# Patient Record
Sex: Female | Born: 1935 | Race: White | Hispanic: No | Smoking: Never smoker
Health system: Southern US, Community
[De-identification: ages and names within clinical notes are randomized; demographics above are authoritative.]

## PROBLEM LIST (undated history)

## (undated) DIAGNOSIS — J40 Bronchitis, not specified as acute or chronic: Secondary | ICD-10-CM

## (undated) DIAGNOSIS — Q211 Atrial septal defect, unspecified: Secondary | ICD-10-CM

## (undated) DIAGNOSIS — M069 Rheumatoid arthritis, unspecified: Secondary | ICD-10-CM

## (undated) DIAGNOSIS — F329 Major depressive disorder, single episode, unspecified: Secondary | ICD-10-CM

## (undated) DIAGNOSIS — M810 Age-related osteoporosis without current pathological fracture: Secondary | ICD-10-CM

## (undated) DIAGNOSIS — F419 Anxiety disorder, unspecified: Secondary | ICD-10-CM

## (undated) DIAGNOSIS — I639 Cerebral infarction, unspecified: Secondary | ICD-10-CM

## (undated) DIAGNOSIS — I350 Nonrheumatic aortic (valve) stenosis: Secondary | ICD-10-CM

## (undated) DIAGNOSIS — J449 Chronic obstructive pulmonary disease, unspecified: Secondary | ICD-10-CM

## (undated) DIAGNOSIS — I10 Essential (primary) hypertension: Secondary | ICD-10-CM

## (undated) DIAGNOSIS — I5031 Acute diastolic (congestive) heart failure: Secondary | ICD-10-CM

## (undated) DIAGNOSIS — F32A Depression, unspecified: Secondary | ICD-10-CM

## (undated) DIAGNOSIS — Q2730 Arteriovenous malformation, site unspecified: Secondary | ICD-10-CM

## (undated) DIAGNOSIS — E039 Hypothyroidism, unspecified: Secondary | ICD-10-CM

## (undated) DIAGNOSIS — D649 Anemia, unspecified: Secondary | ICD-10-CM

## (undated) DIAGNOSIS — R011 Cardiac murmur, unspecified: Secondary | ICD-10-CM

## (undated) HISTORY — PX: CHOLECYSTECTOMY: SHX55

## (undated) HISTORY — PX: FEMUR FRACTURE SURGERY: SHX633

## (undated) HISTORY — PX: TOTAL ABDOMINAL HYSTERECTOMY: SHX209

## (undated) HISTORY — PX: FRACTURE SURGERY: SHX138

---

## 1998-11-08 ENCOUNTER — Emergency Department (HOSPITAL_COMMUNITY): Admission: EM | Admit: 1998-11-08 | Discharge: 1998-11-08 | Payer: Self-pay | Admitting: Emergency Medicine

## 2004-07-25 ENCOUNTER — Encounter: Payer: Self-pay | Admitting: Internal Medicine

## 2004-08-25 ENCOUNTER — Encounter: Payer: Self-pay | Admitting: Internal Medicine

## 2004-09-24 ENCOUNTER — Encounter: Payer: Self-pay | Admitting: Internal Medicine

## 2004-10-25 ENCOUNTER — Encounter: Payer: Self-pay | Admitting: Internal Medicine

## 2004-11-25 ENCOUNTER — Encounter: Payer: Self-pay | Admitting: Internal Medicine

## 2004-12-23 ENCOUNTER — Encounter: Payer: Self-pay | Admitting: Internal Medicine

## 2005-01-21 ENCOUNTER — Ambulatory Visit: Payer: Self-pay | Admitting: Internal Medicine

## 2005-03-07 ENCOUNTER — Inpatient Hospital Stay: Payer: Self-pay

## 2005-03-07 ENCOUNTER — Other Ambulatory Visit: Payer: Self-pay

## 2005-03-10 ENCOUNTER — Other Ambulatory Visit: Payer: Self-pay

## 2005-11-22 ENCOUNTER — Other Ambulatory Visit: Payer: Self-pay

## 2005-11-22 ENCOUNTER — Inpatient Hospital Stay: Payer: Self-pay

## 2005-12-03 ENCOUNTER — Ambulatory Visit: Payer: Self-pay | Admitting: Internal Medicine

## 2005-12-07 ENCOUNTER — Ambulatory Visit: Payer: Self-pay | Admitting: Internal Medicine

## 2005-12-14 ENCOUNTER — Ambulatory Visit: Payer: Self-pay | Admitting: Internal Medicine

## 2006-01-11 ENCOUNTER — Ambulatory Visit: Payer: Self-pay | Admitting: Internal Medicine

## 2006-02-23 ENCOUNTER — Encounter: Payer: Self-pay | Admitting: Emergency Medicine

## 2006-03-11 ENCOUNTER — Inpatient Hospital Stay: Payer: Self-pay | Admitting: Internal Medicine

## 2006-03-11 ENCOUNTER — Other Ambulatory Visit: Payer: Self-pay

## 2006-05-20 ENCOUNTER — Ambulatory Visit: Payer: Self-pay | Admitting: Internal Medicine

## 2006-05-27 ENCOUNTER — Ambulatory Visit: Payer: Self-pay | Admitting: Internal Medicine

## 2006-06-25 ENCOUNTER — Ambulatory Visit: Payer: Self-pay | Admitting: Internal Medicine

## 2006-07-25 ENCOUNTER — Ambulatory Visit: Payer: Self-pay | Admitting: Internal Medicine

## 2006-08-25 ENCOUNTER — Ambulatory Visit: Payer: Self-pay | Admitting: Internal Medicine

## 2006-09-24 ENCOUNTER — Ambulatory Visit: Payer: Self-pay | Admitting: Internal Medicine

## 2006-10-25 ENCOUNTER — Ambulatory Visit: Payer: Self-pay | Admitting: Internal Medicine

## 2006-11-25 ENCOUNTER — Ambulatory Visit: Payer: Self-pay | Admitting: Internal Medicine

## 2006-12-24 ENCOUNTER — Ambulatory Visit: Payer: Self-pay | Admitting: Internal Medicine

## 2007-01-19 ENCOUNTER — Ambulatory Visit: Payer: Self-pay | Admitting: Internal Medicine

## 2007-01-24 ENCOUNTER — Ambulatory Visit: Payer: Self-pay | Admitting: Internal Medicine

## 2007-02-23 ENCOUNTER — Ambulatory Visit: Payer: Self-pay | Admitting: Internal Medicine

## 2007-03-26 ENCOUNTER — Ambulatory Visit: Payer: Self-pay | Admitting: Internal Medicine

## 2007-04-25 ENCOUNTER — Ambulatory Visit: Payer: Self-pay | Admitting: Internal Medicine

## 2007-05-26 ENCOUNTER — Ambulatory Visit: Payer: Self-pay | Admitting: Internal Medicine

## 2007-06-15 ENCOUNTER — Ambulatory Visit: Payer: Self-pay | Admitting: Internal Medicine

## 2007-06-20 ENCOUNTER — Ambulatory Visit: Payer: Self-pay | Admitting: Internal Medicine

## 2007-06-25 ENCOUNTER — Inpatient Hospital Stay: Payer: Self-pay | Admitting: Unknown Physician Specialty

## 2007-06-26 ENCOUNTER — Ambulatory Visit: Payer: Self-pay | Admitting: Internal Medicine

## 2007-07-05 ENCOUNTER — Emergency Department: Payer: Self-pay | Admitting: Emergency Medicine

## 2007-08-15 ENCOUNTER — Ambulatory Visit: Payer: Self-pay | Admitting: Internal Medicine

## 2007-08-26 ENCOUNTER — Ambulatory Visit: Payer: Self-pay | Admitting: Internal Medicine

## 2007-08-31 ENCOUNTER — Ambulatory Visit: Payer: Self-pay | Admitting: Surgery

## 2007-09-09 ENCOUNTER — Emergency Department: Payer: Self-pay | Admitting: Emergency Medicine

## 2007-09-09 ENCOUNTER — Other Ambulatory Visit: Payer: Self-pay

## 2007-09-15 ENCOUNTER — Ambulatory Visit: Payer: Self-pay | Admitting: Unknown Physician Specialty

## 2007-09-25 ENCOUNTER — Ambulatory Visit: Payer: Self-pay | Admitting: Internal Medicine

## 2007-10-13 ENCOUNTER — Emergency Department: Payer: Self-pay | Admitting: Unknown Physician Specialty

## 2007-10-13 ENCOUNTER — Other Ambulatory Visit: Payer: Self-pay

## 2007-10-16 ENCOUNTER — Emergency Department: Payer: Self-pay

## 2007-10-16 ENCOUNTER — Other Ambulatory Visit: Payer: Self-pay

## 2007-10-22 ENCOUNTER — Other Ambulatory Visit: Payer: Self-pay

## 2007-10-22 ENCOUNTER — Inpatient Hospital Stay: Payer: Self-pay | Admitting: Internal Medicine

## 2007-11-10 ENCOUNTER — Ambulatory Visit: Payer: Self-pay | Admitting: Internal Medicine

## 2008-03-08 ENCOUNTER — Ambulatory Visit: Payer: Self-pay | Admitting: Unknown Physician Specialty

## 2008-05-09 ENCOUNTER — Ambulatory Visit: Payer: Self-pay | Admitting: Unknown Physician Specialty

## 2010-08-14 ENCOUNTER — Ambulatory Visit: Payer: Self-pay | Admitting: Unknown Physician Specialty

## 2010-08-18 LAB — PATHOLOGY REPORT

## 2010-09-28 ENCOUNTER — Ambulatory Visit: Payer: Self-pay | Admitting: Unknown Physician Specialty

## 2010-12-28 ENCOUNTER — Emergency Department: Payer: Self-pay | Admitting: Unknown Physician Specialty

## 2011-01-12 ENCOUNTER — Emergency Department: Payer: Self-pay | Admitting: Emergency Medicine

## 2011-11-29 ENCOUNTER — Ambulatory Visit: Payer: Self-pay | Admitting: Internal Medicine

## 2011-12-01 ENCOUNTER — Ambulatory Visit: Payer: Self-pay | Admitting: Vascular Surgery

## 2012-02-17 LAB — BASIC METABOLIC PANEL
Anion Gap: 9 (ref 7–16)
BUN: 18 mg/dL (ref 7–18)
Calcium, Total: 8.4 mg/dL — ABNORMAL LOW (ref 8.5–10.1)
Chloride: 103 mmol/L (ref 98–107)
Co2: 26 mmol/L (ref 21–32)
Creatinine: 0.88 mg/dL (ref 0.60–1.30)
EGFR (African American): >60
EGFR (Non-African Amer.): >60
Glucose: 82 mg/dL (ref 65–99)
Osmolality: 277 (ref 275–301)
Potassium: 4.1 mmol/L (ref 3.5–5.1)
Sodium: 138 mmol/L (ref 136–145)

## 2012-02-17 LAB — CBC
HCT: 36.9 % (ref 35.0–47.0)
HGB: 12.4 g/dL (ref 12.0–16.0)
MCH: 33.7 pg (ref 26.0–34.0)
MCHC: 33.6 g/dL (ref 32.0–36.0)
MCV: 100 fL (ref 80–100)
Platelet: 136 10*3/uL — ABNORMAL LOW (ref 150–440)
RBC: 3.68 10*6/uL — ABNORMAL LOW (ref 3.80–5.20)
RDW: 14 % (ref 11.5–14.5)
WBC: 9 10*3/uL (ref 3.6–11.0)

## 2012-02-17 LAB — APTT: Activated PTT: 30 secs (ref 23.6–35.9)

## 2012-02-17 LAB — PROTIME-INR
INR: 1
Prothrombin Time: 13.3 secs (ref 11.5–14.7)

## 2012-02-18 ENCOUNTER — Inpatient Hospital Stay: Payer: Self-pay | Admitting: Surgery

## 2012-02-18 LAB — CBC WITH DIFFERENTIAL/PLATELET
Basophil #: 0 10*3/uL (ref 0.0–0.1)
Basophil %: 0.4 %
Eosinophil #: 0 10*3/uL (ref 0.0–0.7)
Eosinophil %: 0.5 %
HCT: 22.2 % — ABNORMAL LOW (ref 35.0–47.0)
HGB: 7.4 g/dL — ABNORMAL LOW (ref 12.0–16.0)
Lymphocyte #: 1.9 10*3/uL (ref 1.0–3.6)
Lymphocyte %: 38.1 %
MCH: 33.4 pg (ref 26.0–34.0)
MCHC: 33.1 g/dL (ref 32.0–36.0)
MCV: 101 fL — ABNORMAL HIGH (ref 80–100)
Monocyte #: 0.2 x10 3/mm (ref 0.2–0.9)
Monocyte %: 4.6 %
Neutrophil #: 2.8 10*3/uL (ref 1.4–6.5)
Neutrophil %: 56.4 %
Platelet: 103 10*3/uL — ABNORMAL LOW (ref 150–440)
RBC: 2.2 10*6/uL — ABNORMAL LOW (ref 3.80–5.20)
RDW: 13.6 % (ref 11.5–14.5)
WBC: 4.9 10*3/uL (ref 3.6–11.0)

## 2012-02-19 LAB — CBC WITH DIFFERENTIAL/PLATELET
Basophil #: 0 10*3/uL (ref 0.0–0.1)
Basophil %: 0.4 %
Eosinophil #: 0 10*3/uL (ref 0.0–0.7)
Eosinophil %: 0.7 %
HCT: 28.2 % — ABNORMAL LOW (ref 35.0–47.0)
HGB: 9.6 g/dL — ABNORMAL LOW (ref 12.0–16.0)
Lymphocyte #: 1.9 10*3/uL (ref 1.0–3.6)
Lymphocyte %: 26 %
MCH: 32.6 pg (ref 26.0–34.0)
MCHC: 34 g/dL (ref 32.0–36.0)
MCV: 96 fL (ref 80–100)
Monocyte #: 0.3 x10 3/mm (ref 0.2–0.9)
Monocyte %: 4.2 %
Neutrophil #: 5.1 10*3/uL (ref 1.4–6.5)
Neutrophil %: 68.7 %
Platelet: 62 10*3/uL — ABNORMAL LOW (ref 150–440)
RBC: 2.94 10*6/uL — ABNORMAL LOW (ref 3.80–5.20)
RDW: 15 % — ABNORMAL HIGH (ref 11.5–14.5)
WBC: 7.4 10*3/uL (ref 3.6–11.0)

## 2012-02-19 LAB — BASIC METABOLIC PANEL
Anion Gap: 6 — ABNORMAL LOW (ref 7–16)
BUN: 11 mg/dL (ref 7–18)
Calcium, Total: 6.9 mg/dL — CL (ref 8.5–10.1)
Chloride: 106 mmol/L (ref 98–107)
Co2: 25 mmol/L (ref 21–32)
Creatinine: 0.82 mg/dL (ref 0.60–1.30)
EGFR (African American): >60
EGFR (Non-African Amer.): >60
Glucose: 130 mg/dL — ABNORMAL HIGH (ref 65–99)
Osmolality: 275 (ref 275–301)
Potassium: 4.2 mmol/L (ref 3.5–5.1)
Sodium: 137 mmol/L (ref 136–145)

## 2012-02-19 LAB — HEPATIC FUNCTION PANEL A (ARMC)
Albumin: 1.7 g/dL — ABNORMAL LOW (ref 3.4–5.0)
Alkaline Phosphatase: 43 U/L — ABNORMAL LOW (ref 50–136)
Bilirubin, Direct: 0.1 mg/dL (ref 0.00–0.20)
Bilirubin,Total: 0.3 mg/dL (ref 0.2–1.0)
SGOT(AST): 16 U/L (ref 15–37)
SGPT (ALT): 11 U/L — ABNORMAL LOW
Total Protein: 3.7 g/dL — ABNORMAL LOW (ref 6.4–8.2)

## 2013-07-12 ENCOUNTER — Inpatient Hospital Stay: Payer: Self-pay | Admitting: Internal Medicine

## 2013-07-12 LAB — CBC WITH DIFFERENTIAL/PLATELET
Basophil #: 0 10*3/uL (ref 0.0–0.1)
Basophil %: 0.5 %
Eosinophil #: 0.5 10*3/uL (ref 0.0–0.7)
Eosinophil %: 5.2 %
HCT: 39.1 % (ref 35.0–47.0)
HGB: 13.7 g/dL (ref 12.0–16.0)
Lymphocyte #: 0.8 10*3/uL — ABNORMAL LOW (ref 1.0–3.6)
Lymphocyte %: 9.1 %
MCH: 34.6 pg — ABNORMAL HIGH (ref 26.0–34.0)
MCHC: 35.1 g/dL (ref 32.0–36.0)
MCV: 99 fL (ref 80–100)
Monocyte #: 0.2 x10 3/mm (ref 0.2–0.9)
Monocyte %: 2.4 %
Neutrophil #: 7.5 10*3/uL — ABNORMAL HIGH (ref 1.4–6.5)
Neutrophil %: 82.8 %
Platelet: 168 10*3/uL (ref 150–440)
RBC: 3.97 10*6/uL (ref 3.80–5.20)
RDW: 12.8 % (ref 11.5–14.5)
WBC: 9.1 10*3/uL (ref 3.6–11.0)

## 2013-07-12 LAB — COMPREHENSIVE METABOLIC PANEL
Alkaline Phosphatase: 98 U/L (ref 50–136)
Co2: 29 mmol/L (ref 21–32)
Creatinine: 1.06 mg/dL (ref 0.60–1.30)
EGFR (Non-African Amer.): 49 — ABNORMAL LOW
Osmolality: 273 (ref 275–301)
SGOT(AST): 20 U/L (ref 15–37)

## 2013-07-13 LAB — CBC WITH DIFFERENTIAL/PLATELET
Basophil #: 0 10*3/uL (ref 0.0–0.1)
Basophil %: 0.6 %
Eosinophil #: 0 10*3/uL (ref 0.0–0.7)
HCT: 36.4 % (ref 35.0–47.0)
HGB: 12.6 g/dL (ref 12.0–16.0)
Lymphocyte #: 1.1 10*3/uL (ref 1.0–3.6)
Lymphocyte %: 14.5 %
Monocyte #: 0.1 x10 3/mm — ABNORMAL LOW (ref 0.2–0.9)
Neutrophil %: 83.2 %
RBC: 3.69 10*6/uL — ABNORMAL LOW (ref 3.80–5.20)
WBC: 7.6 10*3/uL (ref 3.6–11.0)

## 2013-07-13 LAB — BASIC METABOLIC PANEL
BUN: 18 mg/dL (ref 7–18)
Calcium, Total: 9 mg/dL (ref 8.5–10.1)
Chloride: 103 mmol/L (ref 98–107)
Creatinine: 1 mg/dL (ref 0.60–1.30)
EGFR (African American): >60
EGFR (Non-African Amer.): 52 — ABNORMAL LOW
Glucose: 149 mg/dL — ABNORMAL HIGH (ref 65–99)
Osmolality: 279 (ref 275–301)
Potassium: 4.4 mmol/L (ref 3.5–5.1)
Sodium: 137 mmol/L (ref 136–145)

## 2013-07-14 LAB — BASIC METABOLIC PANEL
Anion Gap: 6 — ABNORMAL LOW (ref 7–16)
Calcium, Total: 8.8 mg/dL (ref 8.5–10.1)
Chloride: 103 mmol/L (ref 98–107)
Creatinine: 1.2 mg/dL (ref 0.60–1.30)
Glucose: 159 mg/dL — ABNORMAL HIGH (ref 65–99)
Osmolality: 285 (ref 275–301)
Potassium: 4 mmol/L (ref 3.5–5.1)

## 2013-07-14 LAB — CBC WITH DIFFERENTIAL/PLATELET
Basophil #: 0 10*3/uL (ref 0.0–0.1)
Basophil %: 0.3 %
Eosinophil #: 0 10*3/uL (ref 0.0–0.7)
Eosinophil %: 0.2 %
HCT: 35 % (ref 35.0–47.0)
HGB: 12.3 g/dL (ref 12.0–16.0)
Lymphocyte #: 1.2 10*3/uL (ref 1.0–3.6)
MCV: 98 fL (ref 80–100)
Monocyte #: 0.2 x10 3/mm (ref 0.2–0.9)
Monocyte %: 1.7 %
Neutrophil #: 8 10*3/uL — ABNORMAL HIGH (ref 1.4–6.5)
RBC: 3.57 10*6/uL — ABNORMAL LOW (ref 3.80–5.20)
WBC: 9.4 10*3/uL (ref 3.6–11.0)

## 2013-07-17 LAB — COMPREHENSIVE METABOLIC PANEL
Alkaline Phosphatase: 73 U/L (ref 50–136)
BUN: 36 mg/dL — ABNORMAL HIGH (ref 7–18)
Chloride: 101 mmol/L (ref 98–107)
EGFR (African American): 47 — ABNORMAL LOW
EGFR (Non-African Amer.): 41 — ABNORMAL LOW
Glucose: 169 mg/dL — ABNORMAL HIGH (ref 65–99)
Osmolality: 284 (ref 275–301)
Potassium: 4.2 mmol/L (ref 3.5–5.1)
SGOT(AST): 24 U/L (ref 15–37)
Sodium: 136 mmol/L (ref 136–145)

## 2013-07-17 LAB — CBC WITH DIFFERENTIAL/PLATELET
Basophil %: 0.1 %
Eosinophil #: 0 10*3/uL (ref 0.0–0.7)
Eosinophil %: 0.1 %
HGB: 12.8 g/dL (ref 12.0–16.0)
Lymphocyte #: 1.3 10*3/uL (ref 1.0–3.6)
Lymphocyte %: 17.1 %
MCH: 34.7 pg — ABNORMAL HIGH (ref 26.0–34.0)
Monocyte %: 2.2 %
Neutrophil #: 6.2 10*3/uL (ref 1.4–6.5)
Neutrophil %: 80.5 %
Platelet: 204 10*3/uL (ref 150–440)
RBC: 3.69 10*6/uL — ABNORMAL LOW (ref 3.80–5.20)
RDW: 12.8 % (ref 11.5–14.5)

## 2013-07-18 LAB — EXPECTORATED SPUTUM ASSESSMENT W GRAM STAIN, RFLX TO RESP C

## 2014-04-06 LAB — CBC
HCT: 36.3 % (ref 35.0–47.0)
HGB: 12.5 g/dL (ref 12.0–16.0)
MCH: 34.2 pg — AB (ref 26.0–34.0)
MCHC: 34.4 g/dL (ref 32.0–36.0)
MCV: 99 fL (ref 80–100)
Platelet: 153 10*3/uL (ref 150–440)
RBC: 3.65 10*6/uL — ABNORMAL LOW (ref 3.80–5.20)
RDW: 13.1 % (ref 11.5–14.5)
WBC: 10.5 10*3/uL (ref 3.6–11.0)

## 2014-04-07 ENCOUNTER — Inpatient Hospital Stay: Payer: Self-pay | Admitting: Internal Medicine

## 2014-04-07 LAB — URINALYSIS, COMPLETE
Bilirubin,UR: NEGATIVE
Glucose,UR: NEGATIVE mg/dL (ref 0–75)
Hyaline Cast: 3
Ketone: NEGATIVE
Nitrite: NEGATIVE
PH: 5 (ref 4.5–8.0)
Protein: 100
RBC,UR: 43 /HPF (ref 0–5)
Specific Gravity: 1.01 (ref 1.003–1.030)
Squamous Epithelial: NONE SEEN
WBC UR: 1005 /HPF (ref 0–5)

## 2014-04-07 LAB — TROPONIN I
Troponin-I: 0.22 ng/mL — ABNORMAL HIGH
Troponin-I: 0.93 ng/mL — ABNORMAL HIGH
Troponin-I: 0.75 ng/mL — ABNORMAL HIGH

## 2014-04-07 LAB — COMPREHENSIVE METABOLIC PANEL
ALK PHOS: 78 U/L
ALT: 12 U/L (ref 12–78)
ANION GAP: 9 (ref 7–16)
Albumin: 3.2 g/dL — ABNORMAL LOW (ref 3.4–5.0)
BUN: 21 mg/dL — ABNORMAL HIGH (ref 7–18)
Bilirubin,Total: 0.3 mg/dL (ref 0.2–1.0)
CO2: 26 mmol/L (ref 21–32)
Calcium, Total: 9 mg/dL (ref 8.5–10.1)
Chloride: 107 mmol/L (ref 98–107)
Creatinine: 1.77 mg/dL — ABNORMAL HIGH (ref 0.60–1.30)
GFR CALC AF AMER: 30 — AB
GFR CALC NON AF AMER: 26 — AB
Glucose: 130 mg/dL — ABNORMAL HIGH (ref 65–99)
Osmolality: 288 (ref 275–301)
Potassium: 3.7 mmol/L (ref 3.5–5.1)
SGOT(AST): 24 U/L (ref 15–37)
SODIUM: 142 mmol/L (ref 136–145)
Total Protein: 7.1 g/dL (ref 6.4–8.2)

## 2014-04-07 LAB — CK-MB
CK-MB: 1.2 ng/mL (ref 0.5–3.6)
CK-MB: 4.5 ng/mL — ABNORMAL HIGH (ref 0.5–3.6)
CK-MB: 3.8 ng/mL — ABNORMAL HIGH (ref 0.5–3.6)

## 2014-04-07 LAB — CK TOTAL AND CKMB (NOT AT ARMC)
CK, TOTAL: 243 U/L — AB
CK-MB: 3.6 ng/mL (ref 0.5–3.6)

## 2014-04-07 LAB — MAGNESIUM: Magnesium: 1.2 mg/dL — ABNORMAL LOW

## 2014-04-07 LAB — PROTIME-INR
INR: 1
PROTHROMBIN TIME: 13.5 s (ref 11.5–14.7)

## 2014-04-07 LAB — PHOSPHORUS: Phosphorus: 2.8 mg/dL (ref 2.5–4.9)

## 2014-04-08 LAB — CBC WITH DIFFERENTIAL/PLATELET
BASOS ABS: 0.1 10*3/uL (ref 0.0–0.1)
BASOS PCT: 1 %
EOS PCT: 9.2 %
Eosinophil #: 0.7 10*3/uL (ref 0.0–0.7)
HCT: 32 % — AB (ref 35.0–47.0)
HGB: 10.8 g/dL — ABNORMAL LOW (ref 12.0–16.0)
LYMPHS ABS: 1.7 10*3/uL (ref 1.0–3.6)
Lymphocyte %: 23.5 %
MCH: 34.1 pg — ABNORMAL HIGH (ref 26.0–34.0)
MCHC: 33.9 g/dL (ref 32.0–36.0)
MCV: 101 fL — AB (ref 80–100)
Monocyte #: 0.5 x10 3/mm (ref 0.2–0.9)
Monocyte %: 6.3 %
NEUTROS ABS: 4.3 10*3/uL (ref 1.4–6.5)
NEUTROS PCT: 60 %
Platelet: 126 10*3/uL — ABNORMAL LOW (ref 150–440)
RBC: 3.18 10*6/uL — ABNORMAL LOW (ref 3.80–5.20)
RDW: 13.1 % (ref 11.5–14.5)
WBC: 7.2 10*3/uL (ref 3.6–11.0)

## 2014-04-08 LAB — BASIC METABOLIC PANEL
ANION GAP: 4 — AB (ref 7–16)
BUN: 18 mg/dL (ref 7–18)
CALCIUM: 8.1 mg/dL — AB (ref 8.5–10.1)
CHLORIDE: 113 mmol/L — AB (ref 98–107)
CREATININE: 1.28 mg/dL (ref 0.60–1.30)
Co2: 26 mmol/L (ref 21–32)
EGFR (African American): 45 — ABNORMAL LOW
GFR CALC NON AF AMER: 39 — AB
Glucose: 93 mg/dL (ref 65–99)
OSMOLALITY: 287 (ref 275–301)
POTASSIUM: 3.9 mmol/L (ref 3.5–5.1)
Sodium: 143 mmol/L (ref 136–145)

## 2014-04-08 LAB — MAGNESIUM: Magnesium: 1.3 mg/dL — ABNORMAL LOW

## 2014-04-09 LAB — BASIC METABOLIC PANEL
Anion Gap: 3 — ABNORMAL LOW (ref 7–16)
BUN: 13 mg/dL (ref 7–18)
CHLORIDE: 111 mmol/L — AB (ref 98–107)
CO2: 28 mmol/L (ref 21–32)
CREATININE: 1.25 mg/dL (ref 0.60–1.30)
Calcium, Total: 8.7 mg/dL (ref 8.5–10.1)
EGFR (African American): 46 — ABNORMAL LOW
EGFR (Non-African Amer.): 40 — ABNORMAL LOW
Glucose: 96 mg/dL (ref 65–99)
Osmolality: 283 (ref 275–301)
POTASSIUM: 4 mmol/L (ref 3.5–5.1)
SODIUM: 142 mmol/L (ref 136–145)

## 2014-04-09 LAB — CBC WITH DIFFERENTIAL/PLATELET
Basophil #: 0.1 10*3/uL (ref 0.0–0.1)
Basophil %: 1.3 %
EOS ABS: 0.6 10*3/uL (ref 0.0–0.7)
EOS PCT: 12.1 %
HCT: 32.8 % — ABNORMAL LOW (ref 35.0–47.0)
HGB: 11.2 g/dL — ABNORMAL LOW (ref 12.0–16.0)
Lymphocyte #: 1.4 10*3/uL (ref 1.0–3.6)
Lymphocyte %: 26.3 %
MCH: 34 pg (ref 26.0–34.0)
MCHC: 34 g/dL (ref 32.0–36.0)
MCV: 100 fL (ref 80–100)
Monocyte #: 0.5 x10 3/mm (ref 0.2–0.9)
Monocyte %: 9 %
NEUTROS PCT: 51.3 %
Neutrophil #: 2.7 10*3/uL (ref 1.4–6.5)
Platelet: 140 10*3/uL — ABNORMAL LOW (ref 150–440)
RBC: 3.29 10*6/uL — ABNORMAL LOW (ref 3.80–5.20)
RDW: 13.2 % (ref 11.5–14.5)
WBC: 5.3 10*3/uL (ref 3.6–11.0)

## 2014-04-09 LAB — URINE CULTURE

## 2014-04-09 LAB — MAGNESIUM: MAGNESIUM: 2.1 mg/dL

## 2015-02-03 ENCOUNTER — Inpatient Hospital Stay
Admit: 2015-02-03 | Disposition: A | Discharge status: Home / Self Care | Payer: Self-pay | Attending: Internal Medicine | Admitting: Internal Medicine

## 2015-02-03 LAB — URINALYSIS, COMPLETE
BACTERIA: NONE SEEN
Bilirubin,UR: NEGATIVE
GLUCOSE, UR: NEGATIVE mg/dL (ref 0–75)
Ketone: NEGATIVE
Leukocyte Esterase: NEGATIVE
Nitrite: NEGATIVE
Ph: 6 (ref 4.5–8.0)
Protein: NEGATIVE
SQUAMOUS EPITHELIAL: NONE SEEN
Specific Gravity: 1.004 (ref 1.003–1.030)

## 2015-02-03 LAB — CBC WITH DIFFERENTIAL/PLATELET
BASOS PCT: 1 %
Basophil #: 0.1 10*3/uL (ref 0.0–0.1)
EOS PCT: 5.2 %
Eosinophil #: 0.4 10*3/uL (ref 0.0–0.7)
HCT: 39 % (ref 35.0–47.0)
HGB: 13.2 g/dL (ref 12.0–16.0)
LYMPHS PCT: 20.6 %
Lymphocyte #: 1.7 10*3/uL (ref 1.0–3.6)
MCH: 33.9 pg (ref 26.0–34.0)
MCHC: 33.8 g/dL (ref 32.0–36.0)
MCV: 100 fL (ref 80–100)
MONOS PCT: 4 %
Monocyte #: 0.3 x10 3/mm (ref 0.2–0.9)
Neutrophil #: 5.8 10*3/uL (ref 1.4–6.5)
Neutrophil %: 69.2 %
Platelet: 143 10*3/uL — ABNORMAL LOW (ref 150–440)
RBC: 3.89 10*6/uL (ref 3.80–5.20)
RDW: 13.3 % (ref 11.5–14.5)
WBC: 8.3 10*3/uL (ref 3.6–11.0)

## 2015-02-03 LAB — BASIC METABOLIC PANEL
Anion Gap: 9 (ref 7–16)
BUN: 13 mg/dL
CHLORIDE: 101 mmol/L
Calcium, Total: 9.2 mg/dL
Co2: 30 mmol/L
Creatinine: 1.11 mg/dL — ABNORMAL HIGH
GFR CALC AF AMER: 53 — AB
GFR CALC NON AF AMER: 46 — AB
Glucose: 130 mg/dL — ABNORMAL HIGH
POTASSIUM: 3.8 mmol/L
Sodium: 140 mmol/L

## 2015-02-03 LAB — TROPONIN I

## 2015-02-04 LAB — CBC WITH DIFFERENTIAL/PLATELET
BASOS PCT: 0.4 %
Basophil #: 0 10*3/uL (ref 0.0–0.1)
EOS PCT: 0.4 %
Eosinophil #: 0 10*3/uL (ref 0.0–0.7)
HCT: 36.7 % (ref 35.0–47.0)
HGB: 12.3 g/dL (ref 12.0–16.0)
Lymphocyte #: 1 10*3/uL (ref 1.0–3.6)
Lymphocyte %: 11.2 %
MCH: 33.7 pg (ref 26.0–34.0)
MCHC: 33.5 g/dL (ref 32.0–36.0)
MCV: 100 fL (ref 80–100)
Monocyte #: 0.1 x10 3/mm — ABNORMAL LOW (ref 0.2–0.9)
Monocyte %: 1.3 %
NEUTROS PCT: 86.7 %
Neutrophil #: 7.4 10*3/uL — ABNORMAL HIGH (ref 1.4–6.5)
Platelet: 139 10*3/uL — ABNORMAL LOW (ref 150–440)
RBC: 3.65 10*6/uL — AB (ref 3.80–5.20)
RDW: 13 % (ref 11.5–14.5)
WBC: 8.5 10*3/uL (ref 3.6–11.0)

## 2015-02-04 LAB — BASIC METABOLIC PANEL
Anion Gap: 7 (ref 7–16)
BUN: 13 mg/dL
CALCIUM: 9.1 mg/dL
CO2: 31 mmol/L
CREATININE: 0.9 mg/dL
Chloride: 103 mmol/L
EGFR (Non-African Amer.): 59 — ABNORMAL LOW
Glucose: 159 mg/dL — ABNORMAL HIGH
POTASSIUM: 3.9 mmol/L
SODIUM: 141 mmol/L

## 2015-02-05 LAB — CBC WITH DIFFERENTIAL/PLATELET
Basophil #: 0 10*3/uL (ref 0.0–0.1)
Basophil %: 0.4 %
Eosinophil #: 0.2 10*3/uL (ref 0.0–0.7)
Eosinophil %: 2.5 %
HCT: 34.3 % — AB (ref 35.0–47.0)
HGB: 11.8 g/dL — ABNORMAL LOW (ref 12.0–16.0)
LYMPHS ABS: 1.9 10*3/uL (ref 1.0–3.6)
Lymphocyte %: 21.3 %
MCH: 34.2 pg — ABNORMAL HIGH (ref 26.0–34.0)
MCHC: 34.2 g/dL (ref 32.0–36.0)
MCV: 100 fL (ref 80–100)
MONOS PCT: 4.8 %
Monocyte #: 0.4 x10 3/mm (ref 0.2–0.9)
NEUTROS ABS: 6.4 10*3/uL (ref 1.4–6.5)
Neutrophil %: 71 %
Platelet: 138 10*3/uL — ABNORMAL LOW (ref 150–440)
RBC: 3.44 10*6/uL — ABNORMAL LOW (ref 3.80–5.20)
RDW: 13.3 % (ref 11.5–14.5)
WBC: 9 10*3/uL (ref 3.6–11.0)

## 2015-02-05 LAB — BASIC METABOLIC PANEL
Anion Gap: 3 — ABNORMAL LOW (ref 7–16)
BUN: 21 mg/dL — ABNORMAL HIGH
CALCIUM: 8.6 mg/dL — AB
CREATININE: 1.01 mg/dL — AB
Chloride: 101 mmol/L
Co2: 32 mmol/L
EGFR (Non-African Amer.): 51 — ABNORMAL LOW
GFR CALC AF AMER: 59 — AB
Glucose: 91 mg/dL
Potassium: 3.2 mmol/L — ABNORMAL LOW
SODIUM: 136 mmol/L

## 2015-02-06 LAB — CBC WITH DIFFERENTIAL/PLATELET
Basophil #: 0 10*3/uL (ref 0.0–0.1)
Basophil %: 0.3 %
Eosinophil #: 0.1 10*3/uL (ref 0.0–0.7)
Eosinophil %: 1.9 %
HCT: 35.4 % (ref 35.0–47.0)
HGB: 11.7 g/dL — ABNORMAL LOW (ref 12.0–16.0)
LYMPHS PCT: 24.1 %
Lymphocyte #: 1.8 10*3/uL (ref 1.0–3.6)
MCH: 33.3 pg (ref 26.0–34.0)
MCHC: 33.2 g/dL (ref 32.0–36.0)
MCV: 100 fL (ref 80–100)
MONO ABS: 0.4 x10 3/mm (ref 0.2–0.9)
Monocyte %: 5.8 %
Neutrophil #: 5.2 10*3/uL (ref 1.4–6.5)
Neutrophil %: 67.9 %
PLATELETS: 147 10*3/uL — AB (ref 150–440)
RBC: 3.53 10*6/uL — ABNORMAL LOW (ref 3.80–5.20)
RDW: 13.4 % (ref 11.5–14.5)
WBC: 7.6 10*3/uL (ref 3.6–11.0)

## 2015-02-06 LAB — BASIC METABOLIC PANEL
ANION GAP: 8 (ref 7–16)
BUN: 19 mg/dL
CALCIUM: 9.1 mg/dL
Chloride: 103 mmol/L
Co2: 31 mmol/L
Creatinine: 0.98 mg/dL
EGFR (African American): >60
EGFR (Non-African Amer.): 53 — ABNORMAL LOW
Glucose: 91 mg/dL
Potassium: 3.5 mmol/L
Sodium: 142 mmol/L

## 2015-02-14 NOTE — H&P (Signed)
PATIENT NAME:  [name], [name] MR#:  [id number] DATE OF BIRTH:  1928/07/24  DATE OF ADMISSION:  07/12/2013  REASON FOR ADMISSION:  Acute respiratory distress.   HISTORY OF PRESENT ILLNESS:  The patient is an 81 year old female with a long-standing history of chronic anemia, anxiety/depression, valvular heart disease and hyperlipidemia who presents to the office with a 2 to 3-day history of worsening shortness of breath, wheezing and cough. Cough is productive of green sputum. In the office, the patient was noted to be profoundly hypoxic. She was given Solu-Medrol and 2 DuoNeb SVN with only minimal improvement of her symptoms. She remained tachypneic and hypoxic. She is now admitted for further evaluation.   PAST MEDICAL HISTORY: 1.  Chronic anemia.  2.  History of thrombocytopenia.  3.  Anxiety/depression.  4.  Hyperlipidemia.  5.  History of valvular heart disease.  6.  Previous stroke.  7.  Osteoporosis.  8.  History of esophageal stricture.  9.  History of pulmonary AVMs.  10.  Rheumatoid arthritis.  11.  History of DVT.  12.  Status post cholecystectomy.  13.  Status post hysterectomy.   MEDICATIONS: 1.  Reglan 5 mg p.o. q. a.c. 2.  Remeron 30 mg p.o. at bedtime.  3.  Multivitamin 1 p.o. daily.  4.  Os-Cal D1 p.o. daily. 5.  Klor-Con 10 mEq p.o. daily.  6.  Xanax 0.25 mg p.o. b.i.d. as needed.   ALLERGIES:  No known drug allergies.   SOCIAL HISTORY:  The patient is widowed with 3 children. No history of alcohol or tobacco abuse.   FAMILY HISTORY:  Positive for coronary artery disease, stroke, lung cancer and rheumatoid arthritis.  REVIEW OF SYSTEMS:  As per HPI.   PHYSICAL EXAMINATION: GENERAL:  The patient is in moderate respiratory distress.  VITAL SIGNS:  Currently remarkable for a heart rate of 110, with a respiratory rate of 20 and a sat of 84% on room air. Blood pressure 110/60.  HEENT: Normocephalic, atraumatic. Pupils equally round and reactive to light and  accommodation. Extraocular movements are intact. Sclerae are not icteric. Conjunctivae are clear.  Oropharynx is dry, but clear.  NECK:  Supple without JVD. No adenopathy or thyromegaly is noted.  LUNGS: Reveal decreased breath sounds with scattered wheezes and rhonchi. No dullness. Respiratory effort is increased.  CARDIAC: Rapid rate with a regular rhythm. Normal S1 and S2. There is a III/VI systolic murmur noted throughout the precordium. No rubs or gallops are present.  ABDOMEN: Soft, nontender, with normoactive bowel sounds. No organomegaly or masses were appreciated. No hernias or bruits were noted.  EXTREMITIES: Without clubbing, cyanosis or edema. Pulses were 2+ bilaterally.  SKIN:  Warm and dry without rash or lesions.  NEUROLOGIC: Cranial nerves II through XII grossly intact. Deep tendon reflexes were symmetric. Motor and sensory examination is nonfocal.  PSYCHIATRIC:  Revealed a patient who was alert and oriented to person, place and time. She was cooperative and used good judgment.   ASSESSMENT: 1.  Acute respiratory failure.  2.  Presumed pneumonia.  3.  Tachycardia.  4.  Previous stroke.  5.  Valvular heart disease.  6.  Hyperlipidemia.  7.  Chronic anemia.  8.  Anxiety/depression.  9.  Rheumatoid arthritis.   PLAN: The patient will be admitted to the floor with oxygen, IV steroids, IV antibiotics and DuoNeb SVN. We will send off sputum for Gram stain and culture. We will obtain chest x-ray and baseline labs. Wean oxygen as tolerated. Continue her outpatient  medications. Soft diet for now. Further treatment and evaluation will depend upon the patient's progress.  TOTAL TIME SPENT ON THIS PATIENT:  50 minutes.    ____________________________ Duane Lope Judithann Sheen, MD jds:ce D: 07/12/2013 16:44:41 ET T: 07/12/2013 17:08:22 ET JOB#: [id number]  cc: Duane Lope. Judithann Sheen, MD, <Dictator> Aimee Heldman Rodena Medin MD ELECTRONICALLY SIGNED 07/12/2013 19:39

## 2015-02-14 NOTE — Discharge Summary (Signed)
PATIENT NAME:  [name], [name] MR#:  [id number] DATE OF BIRTH:  September 19, 1928  DATE OF ADMISSION:  07/12/2013 DATE OF DISCHARGE:  07/17/2013  REASON FOR ADMISSION: Acute respiratory distress.   HISTORY OF PRESENT ILLNESS: Please see the dictated HPI done by myself on 07/12/2013.   PAST MEDICAL HISTORY: 1.  Chronic anemia.  2.  Thrombocytopenia. 3.  Anxiety/depression.  4.  Hyperlipidemia.  5.  Osteoporosis.  6.  Valvular heart disease.  7.  Previous stroke.  8.  History of pulmonary AVMs.  9.  Rheumatoid arthritis.  10.  History of DVT.   MEDICATIONS ON ADMISSION: Please see admission note.   ALLERGIES: No known drug allergies.   SOCIAL HISTORY, FAMILY HISTORY AND REVIEW OF SYSTEMS: As per admission note.   PHYSICAL EXAM:  GENERAL: The patient was in moderate respiratory distress.  VITAL SIGNS: Remarkable for a blood pressure of 110/60, heart rate of 110, respiratory of 20 and a sat of 84% on room air.  HEAD, EYES, EARS, NOSE, AND THROAT: Unremarkable except for dry oropharynx.  NECK: Was supple without JVD.  LUNGS: Revealed decreased breath sounds with scattered wheezes and rhonchi.  CARDIAC: Revealed a rapid rate with a regular rhythm.  He had a 3/6 systolic murmur noted.   ABDOMEN: Soft and nontender.  EXTREMITIES: Without edema.  NEUROLOGIC: Grossly nonfocal.   HOSPITAL COURSE: The patient was admitted with acute respiratory failure and underlying valvular heart disease. Chest x-ray revealed COPD changes with presumed bronchitis and mild CHF. Her CHF was felt to be diastolic and acute in nature. Echocardiogram showed mild cardiomyopathy but nothing significant.  She was given Lasix once with improvement of her volume status. She was placed on IV steroids, IV antibiotics, DuoNeb SVNs with improvement of her respiratory status. Her chest x-ray improved. She was weaned off oxygen. Her diet was advanced, which she tolerated well. She was soon weaned off oxygen. By 07/17/2013, the  patient was stable and ready for discharge.   DISCHARGE DIAGNOSES: 1.  Acute respiratory failure.  2.  Chronic obstructive pulmonary disease exacerbation.  3.  Acute bronchitis.  4.  Acute diastolic congestive heart failure.  5.  Chronic anemia.  6.  Rheumatoid arthritis.  7.  Anxiety/depression.   DISCHARGE MEDICATIONS: 1.  Colace 200 mg p.o. daily.  2.  Prednisone taper as directed.  3.  Remeron 30 mg p.o. at bedtime.  4.  Reglan 5 mg p.o. t.i.d. before meals.  5.  Zofran 4 mg p.o. q. 4 hours p.r.n. nausea and vomiting.  6.  Aspirin 81 mg p.o. daily.  7.  Xanax 0.25 mg p.o. q. 8 hours p.r.n. anxiety.  8.  Combivent 1 puff q.i.d.  9.  Advair 250/50 1 puff b.i.d.  10.  Spiriva 1 capsule inhaled daily.  11.  Ceftin 250 mg p.o. b.i.d. x 10 days.  12.  Lasix 20 mg p.o. daily.  13.  Klor-Con 10 mEq p.o. daily.  14.  Protonix 40 mg p.o. b.i.d.  15.  Multivitamin 1 p.o. daily.   FOLLOW-UP PLANS AND APPOINTMENTS: The patient was discharged with Home Health. She is on a low-sodium diet. She will follow up with me within 1 week's time, sooner if needed.   ____________________________ Duane Lope Judithann Sheen, MD jds:cb D: 07/24/2013 16:26:56 ET T: 07/24/2013 17:40:13 ET JOB#: [id number]  cc: Duane Lope. Judithann Sheen, MD, <Dictator> Ange Puskas Rodena Medin MD ELECTRONICALLY SIGNED 07/24/2013 20:56

## 2015-02-15 NOTE — H&P (Signed)
PATIENT NAME:  [name], SHOEMAKER MR#:  [id number] DATE OF BIRTH:  03-Dec-1927  DATE OF ADMISSION:  04/06/2014  REFERRING PHYSICIAN:  Dr. Hinda Kehr.  PRIMARY CARE PHYSICIAN:  Dr. Fulton Reek.   CHIEF COMPLAINT:  Weakness, fever, chills.   HISTORY OF PRESENT ILLNESS:  This is an 81 year old female who lives at home with her daughter who helps to take care of her, the patient presents with generalized weakness, confusion, lethargy, decreased appetite, by mouth intake and chills at home, the patient was febrile at 101.1 in the ED, the patient's chest x-ray did not show any acute opacity or infiltrate, but the patient also noticed to have mild cough, as well the patient's urinalysis was significant for UTI with 1005 white blood cells, the patient had right CVA tenderness, family reports the patient had fever and chills at home, as well the patient was noticeable for acute renal failure, where she usually has normal renal function, currently her creatinine is 1.77, the patient denies any shortness of breath, any chest pain, any coffee-ground emesis, any nausea, any vomiting, any diarrhea or constipation, hospitalist service was requested to admit the patient.   PAST MEDICAL HISTORY: 1.  Anemia.  2.  History of thrombocytopenia secondary to methotrexate, resolved after it stopped.  3.  Anxiety and depression.  4.  Hyperlipidemia.  5.  History of valvular heart disease.  6.  Previous CVA.  7.  Osteoporosis.  8.  Esophageal structures.  9.  Pulmonary AVM.   10.  Rheumatoid arthritis.  11.  History of DVT, status post IVC filter.  12.  Status post cholecystectomy.  13.  Status post hysterectomy.   ALLERGIES:  No known drug allergies.   SOCIAL HISTORY:  The patient lives with her daughter at home.  No alcohol.  No tobacco abuse.   FAMILY HISTORY:  Significant for coronary artery disease, CVA, lung cancer and rheumatoid arthritis.   HOME MEDICATIONS: 1.  Aspirin 81 mg daily.  2.   Mirtazapine 30 mg oral at bedtime.  3.  Metoclopramide 5 mg 3 times a day before meals.  4.  Alprazolam 0.25 mg every eight hours as needed.  5.  Lasix 20 mg oral daily.  6.  Potassium 10 mEq oral daily.  7.  Docusate sodium 250 mg oral daily.  8.  Pantoprazole 40 mg oral 2 times a day.  9.  Calcium with vitamin D 1 tablet daily.  10.  Levothyroxine 25 mcg daily.   REVIEW OF SYSTEMS: CONSTITUTIONAL:  Reports fever, chills, fatigue, weakness.  EYES:  Denies blurry vision, double vision, inflammation.  EARS, NOSE, THROAT:  Denies tinnitus, ear pain, hearing loss, epistaxis.  RESPIRATORY:  Denies any shortness of breath, any hemoptysis, any wheezing, any chest pain.  The patient was noticed to have cough.  CARDIOVASCULAR:  Denies any chest pain, edema, palpitation, syncope.  GASTROINTESTINAL:  Denies nausea, vomiting, diarrhea, abdominal pain.  GENITOURINARY:  Denies dysuria, hematuria, renal colic.  ENDOCRINE:  Denies polyuria, polydipsia, heat or cold intolerance.  HEMATOLOGY:  Denies anemia, easy bruising, bleeding diathesis.  INTEGUMENT:  Denies acne, rash or skin lesion.  MUSCULOSKELETAL:  Denies any swelling, gout, cramps.  NEUROLOGIC:  Reports history of CVA in the past, but no residual deficits.  Denies any new onset deficits, numbness, focal deficits or weakness.  PSYCHIATRIC:  Presents with anxiety and insomnia.  Denies any schizophrenia.   PHYSICAL EXAMINATION: VITAL SIGNS:  Temperature 98.8, T-max 101.2, pulse 84, respiratory rate 29, blood pressure 94/48, saturating 97%  on oxygen.  GENERAL:  Frail, elderly female, looks comfortable in bed, in no apparent distress.  HEENT:  Head atraumatic, normocephalic.  Pupils equal, reactive to light.  Pink conjunctivae.  Anicteric sclerae.  Dry oral mucosa.  NECK:  Supple.  No thyromegaly.  No JVD.  CHEST:  Good air entry bilaterally.  No wheezing, rales, or rhonchi.  CARDIOVASCULAR:  S1, S2 heard.  No rubs, murmurs or gallops.  ABDOMEN:   Soft, nontender, nondistended.  Bowel sounds present.  EXTREMITIES:  No edema.  No clubbing.  No cyanosis.  Pedal pulses felt bilaterally.  PSYCHIATRIC:  Appropriate affect.  Awake, alert x 3.  Intact judgment and insight.  NEUROLOGIC:  Cranial nerves grossly intact.  Motor five out of five.  No focal deficits.  MUSCULOSKELETAL:  Has right lower extremity shorter than the left lower extremity on physical exam.  Has right CVA tenderness to palpation.  SKIN:  Warm and dry.  Delayed skin turgor.   PERTINENT LABORATORY DATA:  Glucose 130, BUN 21, creatinine 1.77, sodium 142, potassium 3.7, chloride 107, CO2 26, ALT 12, AST 24, alk phos 78.  White blood cells 10.5, hemoglobin 12.5, hematocrit 36.3, platelets 153,000, urinalysis +3 leukocyte esterase, 1005 white blood cells and +3 bacteria.   IMAGING STUDIES:  Chest x-ray showing no acute cardiopulmonary processes.   ASSESSMENT AND PLAN: 1.  Acute pyelonephritis, the patient presents with fever, chills and tachycardia, urinalysis positive, has right CVA tenderness.  She will be treated for acute pyelonephritis.  She will be started on intravenous levofloxacin, she already received Rocephin in the Emergency Department, but I so suspect as well she has some mild acute bronchitis, so levofloxacin would be better coverage for both upper respiratory and urine infection.  2.  Acute renal failure.  We will hold the patient's Lasix.  We will continue her on intravenous fluids.  3.  History of deep vein thrombosis in the past.  The patient is status post inferior vena cava filter.  4.  History of cerebrovascular accident in the past, the patient has no focal deficits.  We will continue her on aspirin.  5.  Hypothyroidism.  Continue with Synthroid.  6.  Deep vein thrombosis prophylaxis.  SubQ heparin.  7.  History of anxiety and depression.  Continue the patient on her home medications including as needed alprazolam and mirtazapine.    8.  CODE STATUS:  THE  PATIENT IS FULL CODE.  Her daughter is her healthcare power of attorney.   Total time spent on admission and patient care 55 minutes.    ____________________________ Albertine Patricia, MD dse:ea D: 04/07/2014 02:26:19 ET T: 04/07/2014 04:42:01 ET JOB#: [id number]  cc: Albertine Patricia, MD, <Dictator> Theodis Kinsel Graciela Husbands MD ELECTRONICALLY SIGNED 04/07/2014 23:30

## 2015-02-15 NOTE — Consult Note (Signed)
PATIENT NAME:  [name], [name] MR#:  [id number] DATE OF BIRTH:  1928-03-16  DATE OF CONSULTATION:  04/07/2014  CONSULTING PHYSICIAN:  Marcina Millard, MD  PRIMARY CARE PHYSICIAN: Dr. Judithann Sheen.   CARDIOLOGIST: Dr. Gwen Pounds.   CHIEF COMPLAINT: Weakness, fever and chills.   HISTORY OF PRESENT ILLNESS: The patient is an 81 year old female who currently lives at home with her daughter, who was brought to [facility] Emergency Room with generalized weakness, confusion, lethargy, decreased p.o. intake, fever and chills. The patient denied chest pain or shortness of breath. She was brought to [facility] Emergency Room where her initial temperature was 101.1. Urinalysis was diagnostic for urinary tract infection. Admission labs were notable for elevated BUN and creatinine at 21 and 1.77, respectively. Initial troponin was 0.22.  Follow-up troponin was 0.93 in the absence of chest pain or ECG changes.    PAST MEDICAL HISTORY: 1. Valvular heart disease, followed by Dr. Gwen Pounds.  2. Hyperlipidemia.  3. History of CVA.  4. Anemia.  5. Anxiety depression.  6. Osteoporosis.  7. Esophageal strictures.  8. History of pulmonary AVM.  9. Rheumatoid arthritis.  10. History of deep vein thrombosis with IVC filter.   MEDICATIONS ON ADMISSION: Aspirin 81 mg daily, furosemide 20 mg daily, potassium 10 mEq daily, mirtazapine 30 mg at bedtime, metoclopramide 5 mg t.i.d. before meals, alprazolam 0.25 mg q.8 p.r.n., furosemide 20 mg daily, decussate sodium 250 mg daily, pantoprazole 40 mg b.i.d. Calcium with vitamin D 1 daily, levothyroxine 25 mcg daily.   SOCIAL HISTORY: The patient currently lives her daughter and son in law. She denies tobacco abuse.   FAMILY HISTORY: Positive for coronary artery disease.   REVIEW OF SYSTEMS:  CONSTITUTIONAL: The patient did have a fever, chills and generalized weakness, with decreased p.o. intake.  EYES: No blurry vision.  EARS: No hearing loss.  RESPIRATORY: No shortness of  breath.  CARDIOVASCULAR: No chest pain.  GASTROINTESTINAL: No nausea, vomiting, or diarrhea.  GENITOURINARY: The patient reports that she did have dysuria.  ENDOCRINE: No polyuria or polydipsia.  HEMATOLOGICAL: No easy bruising or bleeding.  INTEGUMENTARY: No rash.  MUSCULOSKELETAL: No arthralgias or myalgias.  NEUROLOGICAL: The patient had a prior history of CVA without residual neurological deficits.  PSYCHIATRIC: The patient does have a history of anxiety and depression.   PHYSICAL EXAMINATION: VITAL SIGNS: Blood pressure 114/64, pulse 83, respirations 16, temperature 98.9, pulse oximetry 95%.  HEENT: Pupils equal and reactive to light and accommodation.  NECK: Supple without thyromegaly.  LUNGS: Clear.  HEART: Normal JVP. Normal PMI. Regular rate and rhythm. Normal S1, S2. No appreciable gallop, murmur, or rub.  ABDOMEN: Soft and nontender. Pulses were intact bilaterally.  MUSCULOSKELETAL: Normal muscle tone.  NEUROLOGIC: The patient is alert and oriented x 3. Motor and sensory both grossly intact.   IMPRESSION: An 81 year old female who presents with symptoms consistent with urosepsis with borderline elevated troponin in the absence of chest pain or ECG changes likely due to demand supply ischemia.   RECOMMENDATIONS: 1. Agree with overall current therapy.  2. Would defer full dose anticoagulation.  3. Would defer further cardiac diagnostics at this time.  ____________________________ Marcina Millard, MD ap:sg D: 04/07/2014 09:08:57 ET T: 04/07/2014 10:37:05 ET JOB#: [id number]  cc: Marcina Millard, MD, <Dictator> Marcina Millard MD ELECTRONICALLY SIGNED 04/09/2014 8:33

## 2015-02-15 NOTE — Discharge Summary (Signed)
PATIENT NAME:  [name], [name] MR#:  [id number] DATE OF BIRTH:  1928/03/30  DATE OF ADMISSION:  04/07/2014 DATE OF DISCHARGE:  04/09/2014  REASON FOR ADMISSION: Weakness with fever and chills.   HISTORY OF PRESENT ILLNESS: Please see the dictated HPI done by Dr. Randol Kern on 04/07/2014.   PAST MEDICAL HISTORY: 1.  Chronic anemia.  2.  Chronic thrombocytopenia.  3.  Anxiety/depression.  4.  Hyperlipidemia.  5.  Previous stroke.  6.  Osteoporosis.  7.  Valvular heart disease.  8.  Esophageal strictures.  9.  History of pulmonary AVMs.  10.  Rheumatoid arthritis.  11.  History of deep vein thrombosis status post IVC filter placement.  12.  Status post cholecystectomy.  13.  Status post hysterectomy.    MEDICATIONS ON ADMISSION: Please see admission note.   ALLERGIES: No known drug allergies.   SOCIAL HISTORY: As per admission note.   FAMILY HISTORY: As per admission note.  REVIEW OF SYSTEMS: As per admission note.  PHYSICAL EXAM: GENERAL: The patient was frail-appearing, in no acute distress.  VITAL SIGNS: Remarkable for initial temperature 101.2 with a respiratory rate of 29. Blood pressure 94/48.  HEENT: Unremarkable except for dry oropharynx.  NECK: Supple without JVD.  LUNGS: Clear.  CARDIAC: Regular rate and rhythm. Normal S1, S2.  ABDOMEN: Soft and nontender.  EXTREMITIES: Without edema.  NEUROLOGIC: Grossly nonfocal.   HOSPITAL COURSE: The patient was admitted with acute pyelonephritis. She was placed on IV antibiotics with IV fluids. Blood cultures were negative. The patient improved with symptomatic treatment. She was switched to oral antibiotics. She was eating well. She was afebrile. Her anemia remained stable. Her dysphasia was chronic and stable. By 04/09/2014, the patient was stable and ready for discharge.   DISCHARGE DIAGNOSES: 1.  Pyelonephritis.  2.  Chronic anemia.  3.  Chronic dysphagia.  4.  Generalized weakness.  5.  Previous stroke.     DISCHARGE MEDICATIONS: 1.  Remeron 30 mg p.o. at bedtime.  2.  Reglan 5 mg p.o. t.i.d. before meals.  3.  Aspirin 81 mg p.o. daily.  4.  Xanax 0.25 mg p.o. q.8 hours p.r.n.  5.  Protonix 40 mg p.o. b.i.d.  6.  Synthroid 25 mcg p.o. daily.  7.  Levaquin 250 mg p.o. daily x10 days.    FOLLOW-UP PLANS AND APPOINTMENTS: The patient was discharged on prudent diet. She will be followed by home health and home physical therapy. Low sodium diet. Follow up with me within one week's time, sooner if needed.   ____________________________ Duane Lope Judithann Sheen, MD jds:cg D: 04/22/2014 16:21:56 ET T: 04/23/2014 04:27:31 ET JOB#: [id number]  cc: Duane Lope. Judithann Sheen, MD, <Dictator> Idus Rathke Rodena Medin MD ELECTRONICALLY SIGNED 04/23/2014 7:50

## 2015-02-16 NOTE — Op Note (Signed)
PATIENT NAME:  [name], STOBAUGH MR#:  [id number] DATE OF BIRTH:  July 15, 1928  DATE OF PROCEDURE:  12/01/2011  PREOPERATIVE DIAGNOSES:  1. Extensive left lower extremity deep vein thrombosis.  2. Contraindication to anticoagulation due to previous bleeding with Coumadin and ongoing anemia and failure to thrive.  POSTOPERATIVE DIAGNOSES:  1. Extensive left lower extremity deep vein thrombosis.  2. Contraindication to anticoagulation due to previous bleeding with Coumadin and ongoing anemia and failure to thrive.  PROCEDURES PERFORMED: 1. Ultrasound guidance for vascular access, right femoral vein.  2. Placement of catheter into inferior vena cava.  3. Inferior venacavogram.  4. Placement of [employer] Mer[employer] IVC filter.   SURGEON: Annice Needy, M.D.   ANESTHESIA: Local.   ESTIMATED BLOOD LOSS: Minimal.   FLUOROSCOPY TIME: Less than one minute.  CONTRAST USED: 15 mL.   INDICATION FOR PROCEDURE: This is a female referred to me yesterday in the office. She had extensive left lower extremity deep vein thrombosis. She had failed Coumadin therapy after a stroke in years previous. She has failure to thrive with a weight of 69 pounds and will not be anticoagulated. Her primary care physician and I have the discussed case and the filter is indicated. Risks and benefits were discussed and informed consent was obtained.   DESCRIPTION OF PROCEDURE: The patient was brought to the vascular interventional radiology suite. Groins were shaved and prepped and a sterile surgical field was created. The right femoral vein was visualized with ultrasound and found to widely patent. It was then accessed under direct ultrasound guidance difficulty with a Seldinger needle. A three J-wire was placed. After skin nick and dilatation, the sheath was placed into the inferior vena cava and inferior venacavogram was performed. This showed a patent vena cava with the level of the lowest renal vein at approximately L1. The  filter was deployed at the L1-L2 interspace. The delivery sheath was removed, pressure was held, and a sterile dressing was placed. The patient tolerated the procedure well.  ____________________________ Annice Needy, MD jsd:slb D: 12/01/2011 16:36:12 ET T: 12/01/2011 17:32:02 ET JOB#: [id number]  cc: Annice Needy, MD, <Dictator> Annice Needy MD ELECTRONICALLY SIGNED 12/13/2011 9:23

## 2015-02-16 NOTE — Discharge Summary (Signed)
PATIENT NAME:  [name], [name] MR#:  [id number] DATE OF BIRTH:  [birth year]  DATE OF ADMISSION:  02/18/2012 DATE OF DISCHARGE:  02/20/2012  DISCHARGE DIAGNOSES:  1. Vaginal bleeding. 2. Rectal bleeding. 3. Constipation. 4. Chronic anemia.  5. Hyperlipidemia.  6. Anxiety.  7. History of stroke. 8. History of esophageal stricture. 9. History of deep venous thromboses.   CONSULTANT: GYN   PROCEDURES: None.   HISTORY OF PRESENT ILLNESS/HOSPITAL COURSE: This is a patient who was admitted to the hospital. She routinely gets enemas for constipation and an enema performed the day before admission by a family member resulted in a small amount of bleeding believed to be from the rectum but there was a substantial amount of blood clots in her vagina. She was brought to the hospital and admitted for observation. GYN consultation was obtained and a pelvic exam demonstrated clots in the vagina.   With these findings it was believed that this might suggest trauma and that any procedures to evaluate the rectosigmoid area might be dangerous including a colonoscopy. Dr. Niel Hummer was consulted and he and I spoke about the timing of a colonoscopy to ensure that she does not have a colorectal mass (she has had a colonoscopy 6 or 7 years ago which was negative). The plan would be to allow her to heal and return to normal. She had a transfusion which has helped considerably with her strength and she is discharged in stable condition tolerating a regular diet with no new medications but instructions to restart all of her regular medications. Follow-up with her primary care physician in one week, with me in one week, and with Dr. Niel Hummer in two weeks for scheduling of a colonoscopy.   The family understood all this plan and will follow-up as above.   ____________________________ Adah Salvage. Excell Seltzer, MD rec:drc D: 02/20/2012 13:44:05 ET T: 02/21/2012 12:05:29 ET JOB#: [id number]  cc: Adah Salvage. Excell Seltzer, MD,  <Dictator> Lattie Haw MD ELECTRONICALLY SIGNED 02/21/2012 12:25

## 2015-02-16 NOTE — Consult Note (Signed)
PATIENT NAME:  [name], [name] MR#:  [id number] DATE OF BIRTH:  1928/04/28  DATE OF CONSULTATION:  02/18/2012  REFERRING PHYSICIAN:   CONSULTING PHYSICIAN:  Conard Novak, MD  REASON FOR CONSULTATION: Vaginal bleeding.   HISTORY:  [name] is an 81 year old gravida 3, para 3 female who was seen in consultation at the request of Dr. Natale Lay for vaginal bleeding. She has a long-standing history of constipation for which she frequently requires the use of enemas. Yesterday morning she required such an enema but did not have a good return of stool. One of her family members who is caring for her, therefore, performed disimpaction on her. After this disimpaction it was noted she had some spotting which was initially believed to be from the rectum. However later she had heavier bleeding which then was suspected to be coming from the vagina. She was taken to the emergency room at [facility] where a large bleed was verified and found to be in the vagina. Notably the patient has a history of a total hysterectomy including removal of her cervix and uterus in the 1970s for unclear reasons. What the family states is that the patient has not had cancer. The patient had a CT obtained with a question of a mass within the rectum. The patient is cachectic and has multiple medical comorbidities, but up until this point she has had no vaginal bleeding. She has had no vaginal bleeding since her hysterectomy in the 1970s and has never had an issue. She denies abdominal bloating, loss of appetite, early satiety, fevers, chills.   PAST MEDICAL HISTORY:   1. 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    2. Chronic anemia.  3. Hyperlipidemia.  4. Anxiety and depression.  5. History of cerebral infarct in 2005.  6. History of esophageal stricture.  7. History of a bowel obstruction that did not require surgery.  8. Rheumatoid arthritis.  9. 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   10. Extensive lower extremity edema.   11. History of deep venous thrombosis.    PAST SURGICAL HISTORY: 1. Total abdominal hysterectomy.  2. Laparoscopic cholecystectomy.  3. Repair of right hip fracture and right femur fracture.  4. Placement of inferior vena cava filter due to contraindication to Coumadin in February.    CURRENT MEDICATIONS AT HOME:  1. Remeron 30 mg 1 time daily at bedtime.  2. Reglan 5 mg 3 times daily before meals.  3. Xanax 0.25 mg twice daily.  4. Zegerid 40/1100 p.o. twice daily.  5. Aspirin 81 mg p.o. daily.  6. Klor-Con 20 mEq p.o. on Monday, Wednesday and Friday.  7. Calcium supplement 1200 mg daily by mouth.  8. Vitamin D 800 international units p.o. daily.   ALLERGIES: None.    GYN HISTORY: As noted in the HPI.    SOCIAL HISTORY: The patient denies smoking, alcohol, and tobacco use. She lives with her family.    PHYSICAL EXAMINATION:  VITAL SIGNS: Temperature 36.7, pulse 92, respiratory rate 20, blood pressure 89/46, oxygen saturation 95% on room air.    GENERAL: A cachectic-appearing female in no apparent distress, pale.   NECK:  No cervical lymphadenopathy. Trachea is midline. No thyromegaly.    CARDIOVASCULAR: Regular rate and rhythm with a systolic ejection murmur.    PULMONARY: Clear to auscultation bilaterally.   ABDOMEN: Soft, nontender, nondistended. She has a well-healed vertical midline incision in her lower abdomen.   EXTREMITIES: Very thin with some ecchymoses.  PELVIC EXAM: Reveals on blood on the external genitalia that is clotted. She has a urinary catheter in place. There is no active bleeding coming from the vagina. A speculum is placed in the vagina and approximately 200 mL of clot is removed. No active bleeding is noted.  No lesions are noted. There are no disruptions to epithelium. No masses appreciated. The vaginal cuff appears intact.  On bimanual examination no masses are appreciated. No lesions are noted in the vaginal  epithelium. Rectovaginal examination confirms the bimanual. There are no rectovaginal masses and no apparent communication between the rectum and the vagina. The vagina is packed using a 1-inch vaginal packing with lubricant applied to the surface of the packing and an approximately 1.5-inch wick left in the vagina.    LABORATORY: CBC: WBC 4.9, hemoglobin 7.4, hematocrit 22.2, platelets 103,000.  Coagulation factors: PT 13.3, INR 1.0, APTT 30.0. Chemistry panel: Glucose 82, BUN 18, creatinine 0.88, sodium 138, potassium 4.1, chloride 103, calcium 8.4. Anion gap 9.   IMAGING:  CT scan report impression:  (1) Findings concerning for underlying soft tissue density in the area of the anorectal region possibly involving the vagina; correlate with digital rectal exam and pelvic exam, (2) Old right pubic ramus fracture superiorly and inferiorly, (3) Inferior vena cava filter present. The inferior vena cava appears to be collapsed. Correlate for volume depletion.    ASSESSMENT: This is a very pleasant 81 year old female who has had significant bleeding, very likely appears to be coming from the vagina given the location of the clots; however, no active lesions were found, but no active bleeding was noted either. It is possible that this bleeding was in relation to trauma from an aggressive disimpaction; however, she has had a significant drop in her blood counts though some of this may be somewhat dilutional but still very significant nonetheless.   RECOMMENDATIONS:  1. I have already placed a vaginal packing that should be monitored for signs of vaginal bleeding, which will be evidenced by soaking of the wick that is emanating from the vagina.  2. Recommend a blood transfusion. The patient is asymptomatic now; however, she has very little if any reserve should she have another large bleed.   3. Agree with evaluation by GI. Though no lesions palpated it is possible that the CT scan reading could be more related  to a very large clot in the vagina than an actual true lesion.  4. I will continue to follow along clinically with the patient and will from time to time check the wick from the vaginal packing to verify that she is not continuing to have a bleed.  Please feel free to call the [facility] person on call if any questions arise.         Thank you very much for this very interesting consult.     ____________________________ Conard Novak, MD sdj:vtd D: 02/18/2012 16:17:21 ET T: 02/19/2012 08:59:01 ET JOB#: [id number]  cc: Conard Novak, MD, <Dictator> Conard Novak MD ELECTRONICALLY SIGNED 02/25/2012 10:49

## 2015-02-16 NOTE — Consult Note (Signed)
Brief Consult Note: Diagnosis: ? Rectal mass.   Patient was seen by consultant.   Consult note dictated.   Comments: Patient with vaginakl bleeding and chronic constipation requiring enemas. CT scan is concerning for a soft tissue mass in the area of distal rectum and vagina. Digital rectal examination by surgeon and pelvic examination by a gyanecologist were unremarkable. Dr. Excell Seltzer seem to think local trauma which would explain bleeding and abnormal soft tissue swelling. Last colonoscopy was in 2007 and was reported normal. Patient is anemic with a hemoglobin of 7.4.  Recommendartions: Agree with Dr. Excell Seltzer that a colonoscopy at this time may cause further trauma. Will treat conservatively with PRBC transfusion and follow H and H. If there are no signs of active bleeding, patient can be discharged and will plan on performing colonoscopy or signoidoscopy as OP in couple of weeks. Will follow. Thanks.  Electronic Signatures: Lurline Del (MD)  (Signed 26-Apr-13 17:04)  Authored: Brief Consult Note   Last Updated: 26-Apr-13 17:04 by Lurline Del (MD)

## 2015-02-16 NOTE — H&P (Signed)
PATIENT NAME:  [name], [name] MR#:  [id number] DATE OF BIRTH:  [birth year]  DATE OF ADMISSION:  02/18/2012  CHIEF COMPLAINT: Vaginal bleeding, constipation.   HISTORY: This is an 81 year old white female with a long-standing history of constipation who is taken care of by her family. The patient routinely gets enemas for constipation and got an enema yesterday afternoon with some results by her family member. The patient then had a small amount of bleeding initially thought per rectum and then later this was discovered to be substantial amount of blood clots per vagina. The patient has had no previous issues like this in the past. Remotely she has had a hysterectomy as well as a laparoscopic cholecystectomy. The patient has a long-standing history of constipation treated with enemas and oral laxatives and such.   Of note, the patient had a hysterectomy for what was benign disease. She was brought to the emergency room by her family an evaluation by the emergency room staff, at this time, with a pelvic examination, demonstrated blood in the vagina. The patient did not have any stool per vagina. She did not pass any air per vagina. He has had no urinary symptoms. CT scan was obtained. There is a question of a mass within the rectum. The patient is markedly cachectic. Hemoglobin is 12.4 and platelet count 136,000.   PAST MEDICAL HISTORY:  1. 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 2. Chronic anemia. 3. Hyperlipidemia. 4. Anxiety and depression. 5. History of cerebral infarct. 6. History of esophageal stricture. 7. History of bowel obstruction.  8. Rheumatoid arthritis.  9. History of pulmonary AVMs.  10  DVT Lower extremity.  PAST SURGICAL HISTORY:  1. Hysterectomy.  2. Laparoscopic cholecystectomy, as described above. 3. Repair of right hip fracture and right femur fracture.  4. Placement of inferior vena cava filter due to contraindication to Coumadin, February 2013.   SOCIAL HISTORY: The patient  does not smoke, does not drink, lives with her family who provide her care. She is fairly active in her activities of daily living, walks with a walker.   PHYSICAL EXAMINATION:   VITAL SIGNS: Temperature 97, pulse 85, blood pressure 134/77, respiratory rate 18.   GENERAL: The patient is markedly cachectic, alert and oriented, and cooperative with the examination. She is alert and oriented.  NEUROLOGIC: Intact.  HEENT: Facies are symmetrical.   NECK: Supple. No adenopathy.   LUNGS: Clear.   HEART: Regular rate and rhythm.   ABDOMEN: Soft and nontender, multiple scars.   RECTAL: Rectal examination demonstrates normal tone, soft stool. No bleeding is noted. No obvious mass is seen.  PELVIC: Digital vaginal examination demonstrates a large amount of clot. A pelvic examination was performed in the emergency room which demonstrated no obvious mass, but with bleeding. A vaginal packing was placed.  EXTREMITIES: Spindly, marked cachexia, scar on the right lateral leg.  LABS/STUDIES: BUN 18, creatinine 0.88, sodium 138, potassium 4.1, chloride 103, CO2 26. White count 9, hemoglobin 12.4, hematocrit 36.9, and platelet count 136,000. Pro time 13.3.   IMPRESSION: This is an 81 year old white female with new onset of bright red blood per vagina, question of a rectal mass seen on CT scan, and history of recent deep venous thrombosis status post vena cava filter. Examination fails to demonstrate an obvious mass.   PLAN: The patient will be admitted and vaginal packing will be placed in the emergency room. Consultations will be obtained from Gastroenterology and GYN. Obtain a type and screen. The patient will need a  colonoscopy and gynecological evaluation.  ____________________________ Redge Gainer Egbert Garibaldi, MD mab:slb D: 02/18/2012 07:08:33 ET T: 02/18/2012 09:38:42 ET JOB#: [id number]  cc: Loraine Leriche A. Egbert Garibaldi, MD, <Dictator> Duane Lope. Judithann Sheen, MD Raynald Kemp MD ELECTRONICALLY SIGNED 02/20/2012 11:56

## 2015-02-16 NOTE — Consult Note (Signed)
Brief Consult Note: Diagnosis: bleeding, likely of vaginal origin.   Patient was seen by consultant.   Consult note dictated.   Discussed with Attending MD.   Comments: Approximately 55cc old blood clot removed from vagina.  Once visibility improved, no lesions noted, no active bleeding. Palpation revealed no focal lesions bimanually, no lesions in rectovaginal septum. 1" vaginal packing placed using lubricant with 1.5" wick protruding from vagina.   With most recent hct at 22%, recommend transfusion of pRBCs, especially if she were to experience another large bleed (last drop from 38% to 22).  Electronic Signatures: Conard Novak (MD)  (Signed 26-Apr-13 16:00)  Authored: Brief Consult Note   Last Updated: 26-Apr-13 16:00 by Conard Novak (MD)

## 2015-02-16 NOTE — Consult Note (Signed)
PATIENT NAME:  [name], [name] MR#:  [id number] DATE OF BIRTH:  10-31-1927  DATE OF CONSULTATION:  02/18/2012  REFERRING PHYSICIAN:  Natale Lay, MD CONSULTING PHYSICIAN:  Lurline Del, MD  REASON FOR CONSULTATION: Possible rectal mass.   HISTORY OF PRESENT ILLNESS: This is an 81 year old female with a long-standing history of constipation who is taken care of by her family. Apparently enemas have been used frequently to relieve her constipation at home. She was admitted this morning. After having an enema yesterday the patient started to have bleeding from her vagina. Blood and blood clots were noted coming out of her vagina, and the patient was brought to the emergency room. She was found to be anemic. A pelvic examination has been done by her gynecologist and about 200 mL of blood and clots were removed from the vagina, although no other significant lesions were noted. The patient denies passing any stool or air from the vagina. There is no rectal bleeding reported by the patient or her daughter. CT scan of the abdomen and pelvis was done which showed a soft tissue mass in the area of the vagina and distal rectum raising concerns about possible malignant lesion. A digital rectal examination done by Dr. Natale Lay did not reveal any abnormalities. The patient had a colonoscopy about six years ago by Dr. Mechele Collin and colon was reported normal.   PAST MEDICAL HISTORY:  1. 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 2. History of hip fracture.  3. Chronic anemia. 4. Hyperlipidemia. 5. Depression. 6. History of cerebrovascular accident. 7. Esophageal stricture.  8. History of bowel obstruction. 9. Arthritis. 10. Arteriovenous malformations.  PAST SURGICAL HISTORY:  1. Hysterectomy. 2. Cholecystectomy. 3. Right IVC filter placement. 4. History of hip fracture.   SOCIAL HISTORY: She does not smoke or drink. She lives with her daughter.   FAMILY HISTORY:  Unremarkable.   REVIEW OF SYSTEMS: Negative except for what is mentioned in the History of Present Illness.   PHYSICAL EXAMINATION:   GENERAL: Very cachectic female. She appears somewhat pale, but does not appear to be in any acute distress.   VITAL SIGNS: She is afebrile. Temperature is 98.1, pulse 92, respirations 20, and blood pressure 90/46. She is very cachectic.   LUNGS: Clear to auscultation bilaterally.   HEART: 3/6 systolic murmur, otherwise unremarkable.   LUNGS: Grossly clear.   ABDOMEN: Examination showed a fairly benign abdomen. Bowel sounds are positive but sluggish. No rebound or guarding was noted. No hepatosplenomegaly or ascites.   EXTREMITIES: Examination showed bilateral lower extremity ecchymosis, but otherwise unremarkable.   LABS/STUDIES: White cell count is 4.9, hemoglobin 7.4, and platelet count 103. BUN and creatinine normal. Electrolytes are normal as well.   CT scan of the abdomen and pelvis as above.  ASSESSMENT AND PLAN: The patient is with vaginal bleeding after an enema due to chronic constipation. CT scan showed soft tissue swelling in the area of the distal rectum and vagina. This raises concerns about possible trauma in that area with secondary edema. There are no signs of perforation of the rectum or vagina on physical examination. The possibility of a rectal mass was raised by the radiologist. As the patient's last colonoscopy was six years ago, I believe that a colonoscopy or at least a sigmoidoscopy is needed, although I agree with Dr. Excell Seltzer that due to concerns of recent trauma to the rectum instrumentation should be delayed for a couple of weeks if possible. There are no signs  of active bleeding. I agree with transfusing packed RBCs and following hemoglobin and hematocrit. If there are no signs of further active bleeding, then a colonoscopy or a sigmoidoscopy will be planned a couple of weeks down the road as an outpatient. If there are signs of  active bleeding, then further recommendations will be made. The plan has been discussed with the patient and her daughter as well and they are in full agreement. ____________________________ Lurline Del, MD si:slb D: 02/18/2012 17:10:28 ET T: 02/19/2012 09:42:55 ET JOB#: [id number]  cc: Lurline Del, MD, <Dictator> Lurline Del MD ELECTRONICALLY SIGNED 02/19/2012 20:43

## 2015-02-23 NOTE — Discharge Summary (Signed)
PATIENT NAME:  [name], [name] MR#:  [id number] DATE OF BIRTH:  [birth year]  DATE OF ADMISSION:  02/03/2015 DATE OF DISCHARGE:  02/06/2015  REASON FOR ADMISSION: Difficulty breathing and coughing.   HISTORY OF PRESENT ILLNESS: Please see the dictated HPI done by Dr. Nemiah Commander on 02/03/2015.   PAST MEDICAL HISTORY:  1. Chronic anemia.  2. Chronic thrombocytopenia.  3. Anxiety/depression.  4. Hyperlipidemia.  5. Valvular heart disease.  6. Previous stroke.  7. Rheumatoid arthritis.  8. Osteoporosis.   MEDICATIONS ON ADMISSION: Please see admission note.   ALLERGIES: No known drug allergies.   SOCIAL HISTORY, FAMILY HISTORY, REVIEW OF SYSTEMS:  Per admission note.   PHYSICAL EXAMINATION:  GENERAL: The patient was in no acute distress.  VITAL SIGNS: Remarkable for a temperature of 99.1 with a heart rate of 106, blood pressure 196/83, saturation 88% on room air.  HEENT: Unremarkable.  NECK: Supple without JVD.  LUNGS: Revealed scattered wheezes. No dullness. Respiratory effort was increased.  CARDIAC: Regular rate and rhythm with a normal S1, S2. A 3/6 systolic murmur was noted.  ABDOMEN: Soft and nontender.  EXTREMITIES: Without edema.  NEUROLOGIC: Grossly nonfocal.   HOSPITAL COURSE: The patient was admitted with COPD exacerbation and presumed bronchitis. Chest x-ray revealed no evidence of pneumonia. She was placed on DuoNeb SVNs with IV steroids and IV antibiotics. Her oxygenation improved. She continued to do well. She was weaned off oxygen. Cultures were negative. By 02/06/2015, the patient was stable and ready for discharge.   DISCHARGE DIAGNOSES:  1. Chronic obstructive pulmonary disease exacerbation.  2. Acute respiratory insufficiency.  3. Presumed bronchitis.  4. Chronic dysphagia.  5. Previous stroke.  6. Benign hypertension.   DISCHARGE MEDICATIONS:  1. Prednisone taper as directed.  2. Ceftin 500 mg p.o. b.i.d. x 10 days.  3. Xanax 0.25 mg p.o. q. 8 hours  p.r.n. anxiety.  4. Aspirin 81 mg p.o. daily.  5. Synthroid 25 mcg p.o. daily.  6. Reglan 5 mg p.o. t.i.d.  7. Remeron 30 mg p.o. at bedtime.  8. Protonix 40 mg p.o. b.i.d.  9. Dulera 2 puffs b.i.d.   FOLLOWUP PLANS AND APPOINTMENTS: The patient was discharged on room air with home health and a low sodium diet. She will follow up with me within 1 week's time. Sooner if needed.    ____________________________ Duane Lope Judithann Sheen, MD jds:bu D: 02/13/2015 12:07:18 ET T: 02/13/2015 14:30:34 ET JOB#: [id number]  cc: Duane Lope. Judithann Sheen, MD, <Dictator> Dajour [place] Rodena Medin MD ELECTRONICALLY SIGNED 02/13/2015 21:19

## 2015-02-23 NOTE — H&P (Signed)
PATIENT NAME:  [name], [name] MR#:  [id number] DATE OF BIRTH:  16-Jul-1928  DATE OF ADMISSION:  02/03/2015  ADMITTING PHYSICIAN: Enid Baas, MD   PRIMARY CARE PHYSICIAN: Aram Beecham, MD   PRIMARY CARDIOLOGIST: Arnoldo Hooker, MD.   CHIEF COMPLAINT: Difficulty breathing and coughing.   HISTORY OF PRESENT ILLNESS: 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 The patient is very hard of hearing, so most of the history is obtained from patient's daughter, who is at bedside. The patient stays at home with her daughter and son-in-law. According to daughter, the patient has been doing well up until yesterday, when she started to complain of some cough. She was very weak. Her cough got worse overnight. She could not breathe. She started to have audible wheezing and even to get up and go to the bathroom or even talk was making her more dyspneic. She is not on any home oxygen. She does not take any inhalers at home. She had prior history of pneumonia with chronic obstructive pulmonary disease exacerbation in the past, so patient's daughter brought her to the ER. She had a low-grade temperature of 99 degrees Fahrenheit and rode to the hospital by EMS. She is noted to be hypoxic. Sats of 88%  on room air and hypertensive when she was initially brought in. Chest x-ray did not show any infiltrates, but she probably has significant bronchitis and has brownish productive sputum. No other reported fevers or chills. No nausea, vomiting noted.   PAST MEDICAL HISTORY:  1. Chronic anemia.  2. Chronic thrombocytopenia secondary to methotrexate,  now resolved.  3. Anxiety and depression.  4. Hyperlipidemia.  5. History of valvular heart disease.  6. Prior cerebrovascular accident with only residual balance problems.  7. Osteoporosis.  8. Rheumatoid arthritis.  9. 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 in the past.  10. History of deep vein thrombosis. 11. Chronic obstructive pulmonary disease, not on any home oxygen.  12. Gastroesophageal reflux disease.  13. Gastroparesis.   PAST SURGICAL HISTORY:  1. Right hip surgery.  2. Cholecystectomy.  3. Hysterectomy.  4. IVC filter placement medical.   ALLERGIES TO MEDICATIONS: NO KNOWN DRUG ALLERGIES.   CURRENT HOME MEDICATIONS:  1. Xanax 0.25 mg p.o. every 8 hours p.r.n. for anxiety.  2. Aspirin 81 mg p.o. daily.  3. Calcium vitamin D 1 tablet p.o. daily.  4. Centrum Silver 1 tablet p.o. daily.  5. Synthroid 25 mcg p.o. daily.  6. Reglan 5 mg p.o. t.i.d. before meals.  7. Remeron 30 mg p.o. at bedtime.  8. Protonix 40 mg p.o. b.i.d.   SOCIAL HISTORY: Lives at home with her daughter and son-in-law. Has a walker at baseline. No history of any smoking or currently or in the past. No alcohol use.   FAMILY HISTORY: Significant for stroke in mother and 2 siblings with lung cancer.    REVIEW OF SYSTEMS:   CONSTITUTIONAL: Low fever, positive for fatigue and weakness.  EYES: No blurred vision, double vision, inflammation or glaucoma. Uses glasses.  EARS, NOSE AND THROAT: No tinnitus ear pain. Positive for significant hearing loss. No epistaxis or discharge.  RESPIRATORY: Positive for cough, wheeze, chronic obstructive pulmonary disease  and dyspnea.  CARDIOVASCULAR: No chest pain, orthopnea, edema, arrhythmia, palpitations, or syncope.  GASTROINTESTINAL: No nausea, vomiting, diarrhea, abdominal pain, hematemesis, or melena.  GENITOURINARY: No dysuria, hematuria, renal calculus, frequency, or incontinence.  ENDOCRINE: No polyuria, nocturia,  thyroid problems, heat or cold intolerance.  HEMATOLOGY: No anemia, easy bruising or bleeding.  SKIN: No acne, rash or lesions.  MUSCULOSKELETAL: Positive rheumatoid arthritis. No gout.  NEUROLOGIC: History of cerebrovascular accident with ataxia. No numbness, weakness, motor tic or migraine.  PSYCHOLOGICAL: Past history of anxiety. No insomnia or depression.   PHYSICAL EXAMINATION:  VITAL SIGNS: Temperature 99.1 degrees Fahrenheit, pulse 106, respirations 25, blood pressure 196/83, pulse oximetry 88% on room air.  GENERAL: Thinly-built, well-nourished female lying in bed, not in any acute distress.  HEENT: Normocephalic, atraumatic. Pupils equal, round, reacting to light. Anicteric sclerae extraocular movements intact. Oropharynx clear without erythema, mass, or exudates.  NECK: Supple. No thyromegaly, JVD or carotid bruits. No lymphadenopathy.  LUNGS: Moving air bilaterally. Diffuse scattered wheeze. Expiratory wheeze heard throughout the lungs. No crackles. The use of accessory that is noted on minimal exertion, including talking. There is audible wheeze also heard.  CARDIOVASCULAR: S1, S2, regular rate and rhythm. A 3/6 systolic murmur present. No rubs or gallops.  ABDOMEN: Soft, nontender, nondistended. No hepatosplenomegaly. Normal bowel sounds.  EXTREMITIES: No pedal edema. No clubbing or cyanosis, 2+ dorsalis pedis pulses palpable bilaterally. The patient's right leg is short and after her right hip surgery and she does wear orthotic elevated boot.  SKIN: No acne, rash or lesions. Poor skin turgor poor.   LYMPHATICS: No cervical lymphadenopathy.  NEUROLOGIC: Nerves intact. No focal motor or sensory deficits.  PSYCHOLOGICAL: The patient is awake, alert, oriented x3.   LABORATORY DATA: WBC 8.3, hemoglobin 13.3, hematocrit 39.0, platelet count 143,000.   Sodium 140, potassium 3.8, chloride 101, bicarbonate 30, BUN 13, creatinine 1.1, glucose 130 and calcium of 9.2. Troponin is less than  0.03. Chest x-ray showing no acute cardiopulmonary disease, clear lung fields.   EKG showing normal sinus rhythm. T wave inversions noted in lead V1. Right bundle branch block changes. No acute ST-T wave abnormalities.   ASSESSMENT AND PLAN: An 81 year old female with multiple medical problems including chronic anemia, thrombocytopenia, arthritis, gastroesophageal reflux disease, hypertension hyperlipidemia, history of deep vein thrombosis status post inferior vena cava filter, chronic obstructive pulmonary disease not on home oxygen, who presents to the hospital secondary to chronic obstructive pulmonary disease exacerbation and also acute bronchitis.   1. Acute on chronic obstructive pulmonary disease exacerbation with acute bronchitis, hypoxic on presentation, productive sputum present. Her chest x-ray is clear. Will admit, blood cultures and sputum cultures have been ordered. Started on intravenous Solu-Medrol and also DuoNebs. Started her on Dulera inhaler. The patient does not take any inhalers at all at home. Start on Rocephin and azithromycin. Oxygen support as needed for now.  2. Severe gastroesophageal reflux disease and gastroparesis. Continue her Reglan.  3. History of deep vein thrombosis, status post inferior vena cava filter, appears stable. Continue to just deep vein thrombosis prophylaxis while in the hospital.  4. Hypertension. Does not take any medications at home. Seems to be elevated now. Start on intravenous hydralazine p.r.n. If further elevated, will need to start on medications  5. Depression and anxiety on Remeron and Xanax p.r.n.   CODE STATUS: Full code.   TOTAL TIME SPENT ON ADMISSION OF THIS PATIENT: 50 minutes.    ____________________________ Enid Baas, MD rk:AT D: 02/03/2015 17:18:03 ET T: 02/03/2015  17:54:46 ET JOB#: [id number]  cc: Enid Baas, MD, <Dictator> Duane Lope. Judithann Sheen, MD Enid Baas MD ELECTRONICALLY SIGNED 02/14/2015 15:01

## 2015-04-27 ENCOUNTER — Emergency Department: Payer: Medicare Other

## 2015-04-27 ENCOUNTER — Observation Stay
Admission: EM | Admit: 2015-04-27 | Discharge: 2015-04-29 | Disposition: A | Discharge status: Skilled Nursing Facility | Payer: Medicare Other | Attending: Internal Medicine | Admitting: Internal Medicine

## 2015-04-27 ENCOUNTER — Encounter: Payer: Self-pay | Admitting: Radiology

## 2015-04-27 DIAGNOSIS — S32810A Multiple fractures of pelvis with stable disruption of pelvic ring, initial encounter for closed fracture: Secondary | ICD-10-CM | POA: Diagnosis present

## 2015-04-27 DIAGNOSIS — E785 Hyperlipidemia, unspecified: Secondary | ICD-10-CM | POA: Diagnosis not present

## 2015-04-27 DIAGNOSIS — E871 Hypo-osmolality and hyponatremia: Secondary | ICD-10-CM | POA: Insufficient documentation

## 2015-04-27 DIAGNOSIS — E039 Hypothyroidism, unspecified: Secondary | ICD-10-CM | POA: Diagnosis not present

## 2015-04-27 DIAGNOSIS — Z8673 Personal history of transient ischemic attack (TIA), and cerebral infarction without residual deficits: Secondary | ICD-10-CM | POA: Diagnosis not present

## 2015-04-27 DIAGNOSIS — Z7982 Long term (current) use of aspirin: Secondary | ICD-10-CM | POA: Diagnosis not present

## 2015-04-27 DIAGNOSIS — S32441A Displaced fracture of posterior column [ilioischial] of right acetabulum, initial encounter for closed fracture: Secondary | ICD-10-CM

## 2015-04-27 DIAGNOSIS — Z8249 Family history of ischemic heart disease and other diseases of the circulatory system: Secondary | ICD-10-CM | POA: Insufficient documentation

## 2015-04-27 DIAGNOSIS — Y92009 Unspecified place in unspecified non-institutional (private) residence as the place of occurrence of the external cause: Secondary | ICD-10-CM | POA: Diagnosis not present

## 2015-04-27 DIAGNOSIS — M069 Rheumatoid arthritis, unspecified: Secondary | ICD-10-CM | POA: Insufficient documentation

## 2015-04-27 DIAGNOSIS — F329 Major depressive disorder, single episode, unspecified: Secondary | ICD-10-CM | POA: Diagnosis not present

## 2015-04-27 DIAGNOSIS — L8990 Pressure ulcer of unspecified site, unspecified stage: Secondary | ICD-10-CM | POA: Diagnosis present

## 2015-04-27 DIAGNOSIS — Q211 Atrial septal defect: Secondary | ICD-10-CM | POA: Diagnosis not present

## 2015-04-27 DIAGNOSIS — W19XXXA Unspecified fall, initial encounter: Secondary | ICD-10-CM

## 2015-04-27 DIAGNOSIS — T148XXA Other injury of unspecified body region, initial encounter: Secondary | ICD-10-CM

## 2015-04-27 DIAGNOSIS — J449 Chronic obstructive pulmonary disease, unspecified: Secondary | ICD-10-CM | POA: Insufficient documentation

## 2015-04-27 DIAGNOSIS — K210 Gastro-esophageal reflux disease with esophagitis: Secondary | ICD-10-CM | POA: Insufficient documentation

## 2015-04-27 DIAGNOSIS — M810 Age-related osteoporosis without current pathological fracture: Secondary | ICD-10-CM | POA: Insufficient documentation

## 2015-04-27 DIAGNOSIS — F419 Anxiety disorder, unspecified: Secondary | ICD-10-CM | POA: Diagnosis not present

## 2015-04-27 DIAGNOSIS — I10 Essential (primary) hypertension: Secondary | ICD-10-CM | POA: Diagnosis not present

## 2015-04-27 DIAGNOSIS — R05 Cough: Secondary | ICD-10-CM

## 2015-04-27 DIAGNOSIS — T148 Other injury of unspecified body region: Secondary | ICD-10-CM | POA: Diagnosis present

## 2015-04-27 DIAGNOSIS — R059 Cough, unspecified: Secondary | ICD-10-CM

## 2015-04-27 DIAGNOSIS — S32409A Unspecified fracture of unspecified acetabulum, initial encounter for closed fracture: Secondary | ICD-10-CM | POA: Diagnosis present

## 2015-04-27 DIAGNOSIS — Z86718 Personal history of other venous thrombosis and embolism: Secondary | ICD-10-CM | POA: Diagnosis not present

## 2015-04-27 HISTORY — DX: Nonrheumatic aortic (valve) stenosis: I35.0

## 2015-04-27 HISTORY — DX: Rheumatoid arthritis, unspecified: M06.9

## 2015-04-27 HISTORY — DX: Atrial septal defect: Q21.1

## 2015-04-27 HISTORY — DX: Bronchitis, not specified as acute or chronic: J40

## 2015-04-27 HISTORY — DX: Essential (primary) hypertension: I10

## 2015-04-27 HISTORY — DX: Major depressive disorder, single episode, unspecified: F32.9

## 2015-04-27 HISTORY — DX: Atrial septal defect, unspecified: Q21.10

## 2015-04-27 HISTORY — DX: Age-related osteoporosis without current pathological fracture: M81.0

## 2015-04-27 HISTORY — DX: Chronic obstructive pulmonary disease, unspecified: J44.9

## 2015-04-27 HISTORY — DX: Cerebral infarction, unspecified: I63.9

## 2015-04-27 HISTORY — DX: Depression, unspecified: F32.A

## 2015-04-27 HISTORY — DX: Anxiety disorder, unspecified: F41.9

## 2015-04-27 HISTORY — DX: Arteriovenous malformation, site unspecified: Q27.30

## 2015-04-27 LAB — CBC WITH DIFFERENTIAL/PLATELET
BASOS PCT: 1 %
Basophils Absolute: 0 10*3/uL (ref 0–0.1)
EOS PCT: 3 %
Eosinophils Absolute: 0.2 10*3/uL (ref 0–0.7)
HEMATOCRIT: 33.4 % — AB (ref 35.0–47.0)
HEMOGLOBIN: 11.2 g/dL — AB (ref 12.0–16.0)
LYMPHS PCT: 9 %
Lymphs Abs: 0.8 10*3/uL — ABNORMAL LOW (ref 1.0–3.6)
MCH: 33.2 pg (ref 26.0–34.0)
MCHC: 33.6 g/dL (ref 32.0–36.0)
MCV: 98.6 fL (ref 80.0–100.0)
Monocytes Absolute: 0.3 10*3/uL (ref 0.2–0.9)
Monocytes Relative: 3 %
NEUTROS ABS: 7.8 10*3/uL — AB (ref 1.4–6.5)
NEUTROS PCT: 84 %
Platelets: 142 10*3/uL — ABNORMAL LOW (ref 150–440)
RBC: 3.39 MIL/uL — ABNORMAL LOW (ref 3.80–5.20)
RDW: 13.2 % (ref 11.5–14.5)
WBC: 9.2 10*3/uL (ref 3.6–11.0)

## 2015-04-27 LAB — PROTIME-INR
INR: 1.05
Prothrombin Time: 13.9 seconds (ref 11.4–15.0)

## 2015-04-27 LAB — COMPREHENSIVE METABOLIC PANEL
ALBUMIN: 3.7 g/dL (ref 3.5–5.0)
ALT: 15 U/L (ref 14–54)
ANION GAP: 8 (ref 5–15)
AST: 22 U/L (ref 15–41)
Alkaline Phosphatase: 59 U/L (ref 38–126)
BILIRUBIN TOTAL: 0.5 mg/dL (ref 0.3–1.2)
BUN: 18 mg/dL (ref 6–20)
CO2: 26 mmol/L (ref 22–32)
Calcium: 8.8 mg/dL — ABNORMAL LOW (ref 8.9–10.3)
Chloride: 97 mmol/L — ABNORMAL LOW (ref 101–111)
Creatinine, Ser: 0.92 mg/dL (ref 0.44–1.00)
GFR calc Af Amer: >60 mL/min (ref >60)
GFR calc non Af Amer: 56 mL/min — ABNORMAL LOW (ref >60)
GLUCOSE: 118 mg/dL — AB (ref 65–99)
POTASSIUM: 4.2 mmol/L (ref 3.5–5.1)
SODIUM: 131 mmol/L — AB (ref 135–145)
Total Protein: 6.7 g/dL (ref 6.5–8.1)

## 2015-04-27 LAB — URINALYSIS COMPLETE WITH MICROSCOPIC (ARMC ONLY)
BACTERIA UA: NONE SEEN
Bilirubin Urine: NEGATIVE
Glucose, UA: NEGATIVE mg/dL
LEUKOCYTES UA: NEGATIVE
NITRITE: NEGATIVE
PROTEIN: NEGATIVE mg/dL
SPECIFIC GRAVITY, URINE: 1.006 (ref 1.005–1.030)
WBC UA: NONE SEEN WBC/hpf (ref 0–5)
pH: 6 (ref 5.0–8.0)

## 2015-04-27 LAB — TYPE AND SCREEN
ABO/RH(D): AB POS
ANTIBODY SCREEN: NEGATIVE

## 2015-04-27 LAB — APTT: aPTT: 34 seconds (ref 24–36)

## 2015-04-27 LAB — ABO/RH: ABO/RH(D): AB POS

## 2015-04-27 MED ORDER — ALPRAZOLAM 0.25 MG PO TABS: 0.2500 mg | ORAL_TABLET | Freq: Two times a day (BID) | ORAL | Status: DC | PRN

## 2015-04-27 MED ORDER — ASPIRIN EC 81 MG PO TBEC
81.0000 mg | DELAYED_RELEASE_TABLET | Freq: Every day | ORAL | Status: DC
  Administered 2015-04-28 – 2015-04-29 (×2): 81 mg via ORAL
  Filled 2015-04-27 (×2): qty 1

## 2015-04-27 MED ORDER — POLYETHYLENE GLYCOL 3350 17 G PO PACK: 17.0000 g | PACK | Freq: Every day | ORAL | Status: DC | PRN

## 2015-04-27 MED ORDER — LEVOTHYROXINE SODIUM 25 MCG PO TABS
25.0000 ug | ORAL_TABLET | Freq: Every day | ORAL | Status: DC
  Administered 2015-04-28 – 2015-04-29 (×2): 25 ug via ORAL
  Filled 2015-04-27 (×2): qty 1

## 2015-04-27 MED ORDER — MORPHINE SULFATE 4 MG/ML IJ SOLN
4.0000 mg | Freq: Once | INTRAMUSCULAR | Status: AC
  Administered 2015-04-27: 4 mg via INTRAVENOUS

## 2015-04-27 MED ORDER — ONDANSETRON HCL 4 MG/2ML IJ SOLN
INTRAMUSCULAR | Status: AC
  Administered 2015-04-27: 4 mg via INTRAVENOUS
  Filled 2015-04-27: qty 2

## 2015-04-27 MED ORDER — IOHEXOL 300 MG/ML  SOLN
100.0000 mL | Freq: Once | INTRAMUSCULAR | Status: AC | PRN
  Administered 2015-04-27: 80 mL via INTRAVENOUS

## 2015-04-27 MED ORDER — MIRTAZAPINE 30 MG PO TABS
30.0000 mg | ORAL_TABLET | Freq: Every day | ORAL | Status: DC
Start: 2015-04-27 — End: 2015-04-29
  Administered 2015-04-27 – 2015-04-28 (×2): 30 mg via ORAL
  Filled 2015-04-27 (×2): qty 1

## 2015-04-27 MED ORDER — ONDANSETRON HCL 4 MG PO TABS: 4.0000 mg | ORAL_TABLET | Freq: Four times a day (QID) | ORAL | Status: DC | PRN

## 2015-04-27 MED ORDER — ONDANSETRON HCL 4 MG/2ML IJ SOLN: 4.0000 mg | Freq: Four times a day (QID) | INTRAMUSCULAR | Status: DC | PRN

## 2015-04-27 MED ORDER — FLUTICASONE PROPIONATE 50 MCG/ACT NA SUSP: 2.0000 | Freq: Every day | NASAL | Status: DC

## 2015-04-27 MED ORDER — MORPHINE SULFATE 2 MG/ML IJ SOLN: 2.0000 mg | INTRAMUSCULAR | Status: DC | PRN

## 2015-04-27 MED ORDER — CALCIUM CARBONATE-VITAMIN D 500-200 MG-UNIT PO TABS
1.0000 | ORAL_TABLET | Freq: Every day | ORAL | Status: DC
  Administered 2015-04-28 – 2015-04-29 (×2): 1 via ORAL
  Filled 2015-04-27 (×2): qty 1

## 2015-04-27 MED ORDER — FLUTICASONE PROPIONATE 50 MCG/ACT NA SUSP: 2.0000 | Freq: Every day | NASAL | Status: DC | PRN

## 2015-04-27 MED ORDER — HEPARIN SODIUM (PORCINE) 5000 UNIT/ML IJ SOLN
5000.0000 [IU] | Freq: Three times a day (TID) | INTRAMUSCULAR | Status: DC
  Administered 2015-04-27 – 2015-04-29 (×5): 5000 [IU] via SUBCUTANEOUS
  Filled 2015-04-27 (×5): qty 1

## 2015-04-27 MED ORDER — PANTOPRAZOLE SODIUM 40 MG PO TBEC
40.0000 mg | DELAYED_RELEASE_TABLET | Freq: Two times a day (BID) | ORAL | Status: DC
  Administered 2015-04-27 – 2015-04-29 (×4): 40 mg via ORAL
  Filled 2015-04-27 (×4): qty 1

## 2015-04-27 MED ORDER — ACETAMINOPHEN 325 MG PO TABS: 650.0000 mg | ORAL_TABLET | Freq: Four times a day (QID) | ORAL | Status: DC | PRN

## 2015-04-27 MED ORDER — OXYCODONE HCL 5 MG PO TABS
5.0000 mg | ORAL_TABLET | ORAL | Status: DC | PRN
  Administered 2015-04-27 – 2015-04-29 (×5): 5 mg via ORAL
  Filled 2015-04-27 (×5): qty 1

## 2015-04-27 MED ORDER — MORPHINE SULFATE 4 MG/ML IJ SOLN
INTRAMUSCULAR | Status: AC
  Administered 2015-04-27: 4 mg via INTRAVENOUS
  Filled 2015-04-27: qty 1

## 2015-04-27 MED ORDER — ACETAMINOPHEN 650 MG RE SUPP: 650.0000 mg | Freq: Four times a day (QID) | RECTAL | Status: DC | PRN

## 2015-04-27 MED ORDER — SODIUM CHLORIDE 0.9 % IV SOLN
INTRAVENOUS | Status: DC
  Administered 2015-04-27: 22:00:00 via INTRAVENOUS

## 2015-04-27 MED ORDER — ONDANSETRON HCL 4 MG/2ML IJ SOLN
4.0000 mg | Freq: Once | INTRAMUSCULAR | Status: AC
  Administered 2015-04-27: 4 mg via INTRAVENOUS

## 2015-04-27 MED ORDER — METOCLOPRAMIDE HCL 10 MG PO TABS
5.0000 mg | ORAL_TABLET | Freq: Three times a day (TID) | ORAL | Status: DC
  Administered 2015-04-28 – 2015-04-29 (×5): 5 mg via ORAL
  Filled 2015-04-27 (×5): qty 1

## 2015-04-27 NOTE — ED Notes (Signed)
MD Malinda at bedside. 

## 2015-04-27 NOTE — ED Notes (Signed)
Pt arrived via EMS from home after unwitnessed fall this morning. Pt reports standing at one point, LOC and waking up on floor. Pt does not know of hitting head. Right leg pain reported with increased pain with movement and shortening noted. Pt has hx of right hip surgery x2. Pt A&O x 4 at this time. No acute distress noted.

## 2015-04-27 NOTE — ED Provider Notes (Signed)
[facility] Emergency Department Provider Note  ____________________________________________  Time seen: On arrival, the EMS  I have reviewed the triage vital signs and the nursing notes.   HISTORY  Chief Complaint Fall    HPI [name] is a 81 y.o. female who presents after a fall with right hip pain . She is unsure why she fell but reports 2 surgeries on her right hip in the past. She denies other injuries. No head injury. No chest pain, no shortness of breath. No headache. After she fell she called for help immediately because she was unable to get up she complains of moderate pain with any movement of her right leg.     No past medical history on file.  There are no active problems to display for this patient.   No past surgical history on file.  No current outpatient prescriptions on file.  Allergies Review of patient's allergies indicates not on file.  No family history on file.  Social History History  Substance Use Topics  . Smoking status: Not on file  . Smokeless tobacco: Not on file  . Alcohol Use: Not on file    Review of Systems  Constitutional: Negative for fever. Eyes: Negative for visual changes. ENT: Negative for sore throat Cardiovascular: Negative for chest pain. Respiratory: Negative for shortness of breath. Gastrointestinal: Negative for abdominal pain, vomiting and diarrhea. Genitourinary: Negative for dysuria. Musculoskeletal: Negative for back pain. Positive right hip pain Skin: Negative for rash. Neurological: Negative for headaches or focal weakness Psychiatric: Positive for anxiety  10-point ROS otherwise negative.  ____________________________________________   PHYSICAL EXAM:  VITAL SIGNS:   ED Triage Vitals  Enc Vitals Group     BP --      Pulse --      Resp --      Temp --      Temp src --      SpO2 --      Weight --      Height --      Head Cir --      Peak Flow --      Pain  Score --      Pain Loc --      Pain Edu? --      Excl. in GC? --     Constitutional: Alert and oriented. Well appearing and in no distress. Eyes: Conjunctivae are normal.  ENT   Head: Normocephalic and atraumatic.   Mouth/Throat: Mucous membranes are moist. Cardiovascular: Normal rate, regular rhythm. Normal and symmetric distal pulses are present in all extremities. No murmurs, rubs, or gallops. Respiratory: Normal respiratory effort without tachypnea nor retractions. Breath sounds are clear and equal bilaterally.  Gastrointestinal: Soft and non-tender in all quadrants. No distention. There is no CVA tenderness. Genitourinary: deferred Musculoskeletal: Pain with movement of the right leg, right leg externally rotated with shortening. 2+ distal pulses. No Skin break Neurologic:  Normal speech and language. No gross focal neurologic deficits are appreciated. Skin:  Skin is warm, dry and intact. No rash noted. Psychiatric: Mood and affect are normal. Patient exhibits appropriate insight and judgment.  ____________________________________________    LABS (pertinent positives/negatives)  Labs Reviewed  CBC WITH DIFFERENTIAL/PLATELET - Abnormal; Notable for the following:    RBC 3.39 (*)    Hemoglobin 11.2 (*)    HCT 33.4 (*)    Platelets 142 (*)    Neutro Abs 7.8 (*)    Lymphs Abs 0.8 (*)    All  other components within normal limits  COMPREHENSIVE METABOLIC PANEL - Abnormal; Notable for the following:    Sodium 131 (*)    Chloride 97 (*)    Glucose, Bld 118 (*)    Calcium 8.8 (*)    GFR calc non Af Amer 56 (*)    All other components within normal limits  PROTIME-INR  APTT  URINALYSIS COMPLETEWITH MICROSCOPIC ([facility] ONLY)  TYPE AND SCREEN    ____________________________________________   EKG  None  ____________________________________________    RADIOLOGY I have personally reviewed any xrays that were ordered on this patient:  Hip x-ray shows shows  multiple pelvic fractures, radiologist requests CT scan  ____________________________________________   PROCEDURES  Procedure(s) performed: none  Critical Care performed: yes  CRITICAL CARE Performed by: Jene Every   Total critical care time:30  Critical care time was exclusive of separately billable procedures and treating other patients.  Critical care was necessary to treat or prevent imminent or life-threatening deterioration.  Critical care was time spent personally by me on the following activities: development of treatment plan with patient and/or surrogate as well as nursing, discussions with consultants, evaluation of patient's response to treatment, examination of patient, obtaining history from patient or surrogate, ordering and performing treatments and interventions, ordering and review of laboratory studies, ordering and review of radiographic studies, pulse oximetry and re-evaluation of patient's condition.  ____________________________________________   INITIAL IMPRESSION / ASSESSMENT AND PLAN / ED COURSE  Pertinent labs & imaging results that were available during my care of the patient were reviewed by me and considered in my medical decision making (see chart for details).  Patient with concerning presentation for right hip fracture  ----------------------------------------- 3:11 PM on 04/27/2015 -----------------------------------------  X-ray showed multiple pelvic fractures, CT scan ordered. Transfer likely. We will sign out patient's care to Dr. Darnelle Catalan  ____________________________________________   FINAL CLINICAL IMPRESSION(S) / ED DIAGNOSES  Final diagnoses:  Multiple pelvic fractures, closed, initial encounter     Jene Every, MD 04/27/15 1514

## 2015-04-27 NOTE — ED Notes (Signed)
Pt resting in bed at this time. Family at bedside. Bed locked and in lowest position, call bell in reach.

## 2015-04-27 NOTE — H&P (Signed)
[facility] - [place] at [facility]   PATIENT NAME: [name]    MR#:  [id number]  DATE OF BIRTH:  Feb 26, 1928   DATE OF ADMISSION:  04/27/2015  PRIMARY CARE PHYSICIAN: Osborne Oman  REQUESTING/REFERRING PHYSICIAN: Malinda  CHIEF COMPLAINT:   Chief Complaint  Patient presents with  . Fall    HISTORY OF PRESENT ILLNESS:  [name]  is a 81 y.o. female with a known history of hypothyroidism unspecified, GERD without esophagitis, rheumatoid arthritis presenting after mechanical fall. Patient states she was folding her pajamas and bent down and put them away felt lightheaded and then fell down. She is unsure of any loss of consciousness however denies any head trauma loss of bowel or bladder function. She did have immediate pain of the right hip described only as "pain" intensity 7-8/10, nonradiating, worse with movement, no relieving factors. With the above symptoms percent to the Hospital further workup and evaluation found have a right acetabular fracture. She was evaluated by orthopedic surgery in emergency department deemed nonoperative.  PAST MEDICAL HISTORY:   Past Medical History  Diagnosis Date  . Anxiety   . HTN (hypertension)   . Stroke   . Depression   . Aortic stenosis   . ASD (atrial septal defect)   . Osteoporosis, postmenopausal   . AVM (arteriovenous malformation)     pulmonary, s/p cautery  . Anxiety   . RA (rheumatoid arthritis)     PAST SURGICAL HISTORY:   Past Surgical History  Procedure Laterality Date  . Cholecystectomy    . Femur fracture surgery    . Total abdominal hysterectomy      SOCIAL HISTORY:   History  Substance Use Topics  . Smoking status: Not on file  . Smokeless tobacco: Not on file  . Alcohol Use: No    FAMILY HISTORY:   Family History  Problem Relation Age of Onset  . CAD Mother   . Stroke Mother   . Lung cancer Father   . Rheum arthritis Other     DRUG ALLERGIES:  No Known  Allergies  REVIEW OF SYSTEMS:  REVIEW OF SYSTEMS:  CONSTITUTIONAL: Denies fevers, chills, fatigue, weakness.  EYES: Denies blurred vision, double vision, or eye pain.  EARS, NOSE, THROAT: Denies tinnitus, ear pain, hearing loss.  RESPIRATORY: denies cough, shortness of breath, wheezing  CARDIOVASCULAR: Denies chest pain, palpitations, edema.  GASTROINTESTINAL: Denies nausea, vomiting, diarrhea, abdominal pain.  GENITOURINARY: Denies dysuria, hematuria.  ENDOCRINE: Denies nocturia or thyroid problems. HEMATOLOGIC AND LYMPHATIC: Denies easy bruising or bleeding.  SKIN: Denies rash or lesions.  MUSCULOSKELETAL: Denies pain in neck, back, shoulder, knees, or further arthritic symptoms. Positive for right hip pain as described above NEUROLOGIC: Denies paralysis, paresthesias.  PSYCHIATRIC: Denies anxiety or depressive symptoms. Otherwise full review of systems performed by me is negative.   MEDICATIONS AT HOME:   Prior to Admission medications   Medication Sig Start Date End Date Taking? Authorizing Provider  ALPRAZolam (XANAX) 0.25 MG tablet Take 0.25 mg by mouth 2 (two) times daily as needed. For sleep. 04/09/15  Yes Historical Provider, MD  aspirin EC 81 MG tablet Take 81 mg by mouth daily.   Yes Historical Provider, MD  calcium-vitamin D (CALCIUM 500/D) 500-200 MG-UNIT per tablet Take 1 tablet by mouth daily.   Yes Historical Provider, MD  diclofenac sodium (VOLTAREN) 1 % GEL Apply 4 g topically every 6 (six) hours as needed. Apply 4 grams topically to knee every 6 hours as  needed for pain. 10/16/14  Yes Historical Provider, MD  fluticasone (FLONASE) 50 MCG/ACT nasal spray Place 2 sprays into both nostrils daily as needed. For allergies 02/06/15  Yes Historical Provider, MD  levothyroxine (SYNTHROID, LEVOTHROID) 25 MCG tablet Take 25 mcg by mouth daily. Take on an empty stomach with a glass of water at least 30-60 minutes before breakfast. 02/18/15  Yes Historical Provider, MD   metoCLOPramide (REGLAN) 5 MG tablet Take 5 mg by mouth 3 (three) times daily before meals. 11/17/14  Yes Historical Provider, MD  mirtazapine (REMERON) 30 MG tablet Take 30 mg by mouth at bedtime. 10/23/14  Yes Historical Provider, MD  pantoprazole (PROTONIX) 40 MG tablet Take 40 mg by mouth 2 (two) times daily. 12/23/14  Yes Historical Provider, MD  potassium chloride SA (K-DUR,KLOR-CON) 20 MEQ tablet Take 20 mEq by mouth daily. 02/21/15 02/21/16 Yes Historical Provider, MD      VITAL SIGNS:  Blood pressure 160/73, pulse 96, resp. rate 17, SpO2 93 %.  PHYSICAL EXAMINATION:  VITAL SIGNS: Filed Vitals:   04/27/15 2030  BP: 160/73  Pulse:   Resp:    GENERAL:81 y.o.female currently in no acute distress.  HEAD: Normocephalic, atraumatic.  EYES: Pupils equal, round, reactive to light. Extraocular muscles intact. No scleral icterus.  MOUTH: Moist mucosal membrane. Dentition intact. No abscess noted.  EAR, NOSE, THROAT: Clear without exudates. No external lesions.  NECK: Supple. No thyromegaly. No nodules. No JVD.  PULMONARY: Clear to ascultation, without wheeze rails or rhonci. No use of accessory muscles, Good respiratory effort. good air entry bilaterally CHEST: Nontender to palpation.  CARDIOVASCULAR: S1 and S2. Regular rate and rhythm. 3/6 systolic ejection murmur, rubs, or gallops. No edema. Pedal pulses 2+ bilaterally.  GASTROINTESTINAL: Soft, nontender, nondistended. No masses. Positive bowel sounds. No hepatosplenomegaly.  MUSCULOSKELETAL: No swelling, clubbing, or edema. Range of motion limited right lower extremity given fracture NEUROLOGIC: Cranial nerves II through XII are intact. No gross focal neurological deficits. Sensation intact. Reflexes intact.  SKIN: No ulceration, lesions, rashes, or cyanosis. Skin warm and dry. Turgor intact.  PSYCHIATRIC: Mood, affect within normal limits. The patient is awake, alert and oriented x 3. Insight, judgment intact.    LABORATORY PANEL:    CBC  Recent Labs Lab 04/27/15 1431  WBC 9.2  HGB 11.2*  HCT 33.4*  PLT 142*   ------------------------------------------------------------------------------------------------------------------  Chemistries   Recent Labs Lab 04/27/15 1326  NA 131*  K 4.2  CL 97*  CO2 26  GLUCOSE 118*  BUN 18  CREATININE 0.92  CALCIUM 8.8*  AST 22  ALT 15  ALKPHOS 59  BILITOT 0.5   ------------------------------------------------------------------------------------------------------------------  Cardiac Enzymes No results for input(s): TROPONINI in the last 168 hours. ------------------------------------------------------------------------------------------------------------------  RADIOLOGY:  Ct Abdomen Pelvis W Contrast  04/27/2015   CLINICAL DATA:  Fall on RIGHT hip this morning, known pelvic fractures, injury  EXAM: CT ABDOMEN AND PELVIS WITH CONTRAST  TECHNIQUE: Multidetector CT imaging of the abdomen and pelvis was performed using the standard protocol following bolus administration of intravenous contrast. Sagittal and coronal MPR images reconstructed from axial data set.  CONTRAST:  72mL OMNIPAQUE IOHEXOL 300 MG/ML SOLN IV. No oral contrast administered.  COMPARISON:  02/18/2012  FINDINGS: Linear subsegmental atelectasis medial LEFT lower lobe.  Few tiny liver cysts.  Gallbladder surgically absent with mild stable intrahepatic biliary dilatation.  Liver, spleen, pancreas, and adrenal glands normal.  Symmetric nephrograms with small cyst at inferior pole LEFT kidney.  IVC filter.  Scattered atherosclerotic calcifications.  Uterus surgically absent with nonvisualization of ovaries.  Normal appearing bladder and ureters.  Appendix not definitely visualized.  Moderate-sized hiatal hernia.  Stomach and bowel loops otherwise grossly normal.  No mass, adenopathy, free fluid or free air.  Bones diffusely demineralized.  No acute lumbar spine abnormalities.  Callus identified at old fractures of  the RIGHT superior and inferior pubic rami with superimposed acute nondisplaced fractures.  Ccomminuted fracture of medial wall of RIGHT acetabulum seen with extension into roof.  Anterior column RIGHT acetabulum grossly intact.  Fracture through the cranial aspect of the posterior column RIGHT acetabulum, nondisplaced.  No acute LEFT pelvic abnormalities.  Asymmetric iliac sclerosis at RIGHT SI joint.  Deformity of RIGHT sent lateral sacral ala consistent with old healed fracture.  IMPRESSION: Osseous demineralization with a complex fracture involving the medial and superior walls of the RIGHT acetabulum extending through the superior aspect of the posterior column.  Additional acute fractures through callus at old healed fractures of the RIGHT superior and inferior pubic rami.  Old healed RIGHT sacral fracture.  Moderate-sized hiatal hernia.  No acute intra-abdominal or intrapelvic abnormalities.   Electronically Signed   By: Ulyses Southward M.D.   On: 04/27/2015 16:12   Dg Pelvis Comp Min 3v  04/27/2015   CLINICAL DATA:  Right acetabular fracture.  EXAM: JUDET PELVIS - 3+ VIEW  COMPARISON:  CT scan dated 04/27/2015  FINDINGS: There is a comminuted acute fracture of the right acetabulum and there are old incompletely healed fractures of right superior and inferior pubic rami. There is slight over riding of the posterior medial component of the acetabular fracture. There is slight displacement and overriding of the superior acetabular fractures.  Diffuse osteopenia. Old healed fracture of the proximal femur with compression screw and sideplate in place.  IMPRESSION: Slightly displaced acute right acetabular fractures. Incompletely healed right inferior and superior pubic ramus fracture superior   Electronically Signed   By: Francene Boyers M.D.   On: 04/27/2015 19:13   Dg Hip Unilat With Pelvis 2-3 Views Right  04/27/2015   CLINICAL DATA:  Unwitnessed fall this morning, loss of consciousness, woke up on floor, RIGHT  leg pain, increased pain with movement, shortening, history of RIGHT hip surgery x 2  EXAM: RIGHT HIP (WITH PELVIS) 2-3 VIEWS  COMPARISON:  11/22/2005  FINDINGS: Lateral plate with compression screw proximal RIGHT femur post ORIF of an intertrochanteric fracture with an additional IM nail at the mid to distal RIGHT femur.  Hardware appears intact.  No femoral head dislocation.  Displaced fractures involving RIGHT superior and inferior pubic rami as well as the RIGHT acetabulum.  Minimal sclerosis at RIGHT SI joint greater than LEFT.  No additional pelvic fractures identified.  Significant atherosclerotic calcifications.  IMPRESSION: Multiple RIGHT pelvic fractures including superior and inferior pubic rami as well as involvement of RIGHT acetabulum.  Recommend further assessment by CT.  Prior ORIF proximal and distal RIGHT femur.   Electronically Signed   By: Ulyses Southward M.D.   On: 04/27/2015 14:07    EKG:   Orders placed or performed in visit on 04/07/14  . EKG 12-Lead    IMPRESSION AND PLAN:   81 year old Caucasian female history of hypothyroidism unspecified, rheumatoid arthritis presenting after a fall found to have right acetabular fracture.  1. Right acetabular fracture, closed, initial encounter: Patient evaluated by orthopedics, deemed nonoperative. Provide pain control is required as well as physical therapy patient will likely require placement, initiate bowel regimen as well 2. Hyponatremia:  IV fluid hydration normal saline follow sodium levels 3. Hypothyroidism unspecified: Synthroid 4. GERD. Esophagitis: PPI therapy 5. Venous thromboembolism prophylactic: Heparin subcutaneous    All the records are reviewed and case discussed with ED provider. Management plans discussed with the patient, family and they are in agreement.  CODE STATUS: Full  TOTAL TIME TAKING CARE OF THIS PATIENT: 35 minutes.    Morgyn Marut,  Mardi Mainland.D on 04/27/2015 at 8:32 PM  Between 7am to 6pm - Pager -  [phone]  After 6pm: House Pager: - [phone]  [facility]  Office  [phone]  CC: Primary care physician;  Osborne Oman

## 2015-04-27 NOTE — Consult Note (Signed)
History of present illness: 81 year old female presents with her daughter and son-in-law this evening; she reports a fall at home; she cannot recall tripping, she denies any loss of consciousness or chest pain; she cannot recall the cause of why she fell; she presented to the emergency department today and was diagnosed with an acetabular fracture for which I was asked to provide definitive consultation; the patient notes anterolateral hip pain which is both sharp and dull; she notes a significant leg length discrepancy from her prior intertrochanteric fracture;  PAST MEDICAL HISTORY:  1.Chronic anemia.  2.Chronic thrombocytopenia secondary to methotrexate, now resolved.  3.Anxiety and depression.  4.Hyperlipidemia.  5.History of valvular heart disease.  6.Prior cerebrovascular accident with only residual balance problems.  7.Osteoporosis.  8.Rheumatoid arthritis.  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  10.History of deep vein thrombosis. 11.Chronic obstructive pulmonary disease, not on any home oxygen.  12.Gastroesophageal reflux disease.  13.Gastroparesis.   PAST SURGICAL HISTORY:  1.Right hip surgery.  2.Cholecystectomy.  3.Hysterectomy.  4.IVC filter placement medical.   ALLERGIES TO MEDICATIONS: NO KNOWN DRUG ALLERGIES.   CURRENT HOME MEDICATIONS:  1.Xanax 0.25 mg p.o. every 8 hours p.r.n. for anxiety.  2.Aspirin 81 mg p.o. daily.  3.Calcium vitamin D 1 tablet p.o. daily.  4.Centrum Silver 1 tablet p.o. daily.  5.Synthroid 25 mcg p.o. daily.  6.Reglan 5 mg p.o. t.i.d. before meals.  7.Remeron 30 mg p.o. at bedtime.  8.Protonix 40 mg p.o. b.i.d.   SOCIAL HISTORY: Lives at home with her daughter and son-in-law. Has a walker at baseline. No history of any smoking or currently or in the past. No alcohol use.   Physical  exam: Gen.: Pleasant alert minimally distressed elderly female appearing stated age, presenting with her daughter, Vital signs: On chart Psychiatric mood and affect appropriate Skin exam: Warm and intact over the right hip with healed prior lateral incision; Lymphatic exam: Minimal swelling right hip Vascular examination: Right lower extremity 1+ dorsalis pedis and posterior tibialis pulse Neurologic exam: Right lower extremity intact at the foot Orthopedic examination: Bilateral upper extremities and left lower extremity nontender to palpation and without deformity; pelvis stable to AP and lateral compression; no elicited tenderness to palpation in the lumbar spine or sacral region; moderate tenderness to palpation over the groin and anterolateral aspect of the hip; moderate leg length discrepancy with external rotation of the right lower extremity (daughter notes that this is chronic)  Radiographic evaluation: AP pelvis with judet  views demonstrate complex acetabular fracture with relative preservation of the acetabular dome and predominant involvement of the anterior column with displacement; there is a healed intertrochanteric fracture with prior sliding hip screw construct in place with significant external rotation malunion; also marked shortening of the right lower extremity; CT of the pelvis with reconstructions demonstrate that the acetabular dome is congruent except for some marginal impaction far laterally, the femoral head is reduced,  Impression: closed complex right hip acetabular fracture displaced with relative preservation of the acetabular dome  Plan: Nonoperative management given that the hip is reduced and the acetabular dome is well-preserved; Weightbearing management: 6 weeks toe-touch, and 6 weeks advance to weightbearing as tolerated; Recommend 5 pounds Buck's traction right lower extremity while in bed; DVT prophylaxis per the medical /hospitalist team, recommend teds and  sequential's while in the hospital Follow-up with Dr. Martha Clan in 10-14 days in the office Signing off ==thanks  for the consultation please call if further questions   Annamary Rummage,  MD Orthopedic service

## 2015-04-27 NOTE — ED Provider Notes (Signed)
CT obtained Dr. Suzan Nailer the orthopedic doctor on call reviewed it feels the acetabular fracture is nondisplaced and will heal without operative intervention wishes me to have the hospitalist admit the patient and he will consult will go from there.  Arnaldo Natal, MD 04/27/15 [phone]

## 2015-04-28 ENCOUNTER — Encounter: Payer: Self-pay | Admitting: *Deleted

## 2015-04-28 ENCOUNTER — Observation Stay: Payer: Medicare Other

## 2015-04-28 DIAGNOSIS — L8990 Pressure ulcer of unspecified site, unspecified stage: Secondary | ICD-10-CM | POA: Diagnosis present

## 2015-04-28 DIAGNOSIS — S32441A Displaced fracture of posterior column [ilioischial] of right acetabulum, initial encounter for closed fracture: Secondary | ICD-10-CM | POA: Diagnosis not present

## 2015-04-28 MED ORDER — GUAIFENESIN ER 600 MG PO TB12
600.0000 mg | ORAL_TABLET | Freq: Two times a day (BID) | ORAL | Status: DC
  Administered 2015-04-28 – 2015-04-29 (×3): 600 mg via ORAL
  Filled 2015-04-28 (×3): qty 1

## 2015-04-28 NOTE — Progress Notes (Signed)
Clinical Child psychotherapist (CSW) presented bed offers to patient and her daughter Marylu Lund. They chose [facility]	[place]. CSW left a voicemail with Amy admissions at [facility] making her aware of above. CSW will continue to follow and assist as needed.   Jetta Lout, LCSWA [phone]

## 2015-04-28 NOTE — Clinical Social Work Note (Signed)
Clinical Social Work Assessment  Patient Details  Name: [name] MRN: [id number] Date of Birth: May 12, 1928  Date of referral:  04/28/15               Reason for consult:  Facility Placement                Permission sought to share information with:  Chartered certified accountant granted to share information::  Yes, Verbal Permission Granted  Name::      [place]::   [place]   Relationship::     Contact Information:     Housing/Transportation Living arrangements for the past 2 months:  [place] of Information:  Patient, Adult Children, Power of Attorney Patient Interpreter Needed:  None Criminal Activity/Legal Involvement Pertinent to Current Situation/Hospitalization:  No - Comment as needed Significant Relationships:  Adult Children Lives with:  Adult Children Do you feel safe going back to the place where you live?  Yes Need for family participation in patient care:  Yes (Comment)  Care giving concerns:  Patient lives with her daughter and son in law.    Social Worker assessment / plan:  Holiday representative (CSW) received SNF consult. CSW met with patient and her daughter [name] HPOA [phone] and son in law [name] were at bedside. CSW introduced self and explained role of CSW department. Patient was laying in the bed and answered questions appropriately. Patient lives with her daughter [name] and son in law. Daughter and son in law are retired. Per daughter patient is only left alone for a few hours when she goes to the grocery store. Patient fell at home while trying to fold laundry. CSW explained SNF process. Patient and daughter are agreeable to SNF search. Patient has been to [facility] in the past. Patient does not want to return to [facility]. Daughter prefers [facility] or [facility].   FL2 complete and faxed out.    Employment status:  Retired Office manager PT Recommendations:  [place] / Referral to community resources:  [place]  Patient/Family's Response to care:  Patient and daughter are agreeable to [facility] and prefer [facility] or [place].   Patient/Family's Understanding of and Emotional Response to Diagnosis, Current Treatment, and Prognosis: Patient was pleasant and thanked CSW for visit.   Emotional Assessment Appearance:    Attitude/Demeanor/Rapport:    Affect (typically observed):  Pleasant, Quiet Orientation:  Oriented to Self, Oriented to Place, Oriented to  Time, Oriented to Situation Alcohol / Substance use:  Not Applicable Psych involvement (Current and /or in the community):  No (Comment)  Discharge Needs  Concerns to be addressed:  Discharge Planning Concerns Readmission within the last 30 days:  No Current discharge risk:  Chronically ill Barriers to Discharge:  Continued Medical Work up   Elwyn Reach 04/28/2015, 4:34 PM

## 2015-04-28 NOTE — Care Management Note (Signed)
Case Management Note  Patient Details  Name: SHRINIKA BLATZ MRN: [id number] Date of Birth: 03/03/1928  Subjective/Objective:  Presents from home following a fall. Found to have a right closed acetabular fracture. Surgery consult and not a candidate for surgical intervention. PT pending.             Action/Plan:   Expected Discharge Date:                  Expected Discharge Plan:     In-House Referral:  Clinical Social Work  Discharge planning Services     Post Acute Care Choice:    Choice offered to:     DME Arranged:    DME Agency:     HH Arranged:    HH Agency:     Status of Service:     Medicare Important Message Given:    Date Medicare IM Given:    Medicare IM give by:    Date Additional Medicare IM Given:    Additional Medicare Important Message give by:     If discussed at Long Length of Stay Meetings, dates discussed:    Additional Comments:  Marily Memos, RN 04/28/2015, 10:06 AM

## 2015-04-28 NOTE — Evaluation (Signed)
Physical Therapy Evaluation Patient Details Name: [name] MRN: [id number] DOB: September 19, 1928 Today's Date: 04/28/2015   History of Present Illness  [name] is a 81 y.o. female with a known history of hypothyroidism unspecified, GERD without esophagitis, rheumatoid arthritis presenting after mechanical fall. X-rays show a right acetabular fracture which is non-operative. She is RLE TDWB;   Clinical Impression  81 yo Female, s/p RLE acetabular fracture which is non-operative exhibits increased weakness and decreased mobility. Patient is hard of hearing and had difficulty answering questions. Her daughter was present during part of the eval. She was living at home with her daughter and son-inlaw prior to admittance. Patient required some assistance for bathing but for most part was modified independent with RW. Patient currently is mod A for bed mobility, transfers and gait. She is limited to short distances due to increased pain. Patient was able to maintain RLE TDWB well during gait as her RLE is shorter than LLE and she would not be able to put foot fully down on floor anyway. Patient would benefit from additional skilled PT intervention to improve LE strength, balance/gait safety. Patient would benefit from additional skilled PT intervention at SNF upon discharge due to limited mobility.     Follow Up Recommendations SNF    Equipment Recommendations       Recommendations for Other Services       Precautions / Restrictions Precautions Precautions: Fall Restrictions Weight Bearing Restrictions: Yes RLE Weight Bearing: Touchdown weight bearing      Mobility  Bed Mobility Overal bed mobility: Needs Assistance Bed Mobility: Supine to Sit     Supine to sit: Mod assist     General bed mobility comments: with bed rails; requiring mod VCs for hand placement; patient required assistance for moving RLE but was able to move LLE in the bed;   Transfers Overall transfer level:  Needs assistance Equipment used: Rolling walker (2 wheeled) Transfers: Sit to/from Stand Sit to Stand: Mod assist         General transfer comment: with RLE TDWB; patient able to maintain WB restriction due to shorter RLE as compared to LLE; Patient required mod Vcs for hand placement and mod A for sit to stand;   Ambulation/Gait Ambulation/Gait assistance: Mod assist Ambulation Distance (Feet): 2 Feet Assistive device: Rolling walker (2 wheeled) Gait Pattern/deviations: Step-to pattern;Decreased step length - left;Decreased stance time - right;Decreased dorsiflexion - left;Decreased weight shift to right   Gait velocity interpretation: Below normal speed for age/gender General Gait Details: able to take a few steps to bedside chair, with RW, RLE is TDWB; Patient requires mod A for balance and assistance to propel walker;   Stairs            Wheelchair Mobility    Modified Rankin (Stroke Patients Only)       Balance Overall balance assessment: Needs assistance Sitting-balance support: Bilateral upper extremity supported;Feet supported Sitting balance-Leahy Scale: Fair Sitting balance - Comments: requires min A-min guard for maintaining sitting position Postural control: Left lateral lean Standing balance support: Bilateral upper extremity supported Standing balance-Leahy Scale: Poor Standing balance comment: requires mod A with RW for standing balance;                             Pertinent Vitals/Pain Pain Assessment: 0-10 Pain Score: 10-Worst pain ever Pain Location: right hip Pain Descriptors / Indicators: Sore Pain Intervention(s): Repositioned;Patient requesting pain meds-RN notified    Home  Living Family/patient expects to be discharged to:: Private residence Living Arrangements: Children Available Help at Discharge: Family (daughter is available 24/7) Type of Home: House Home Access: Stairs to enter   [employer] of Steps: 2 steps  in back, 1 step in garage Home Layout: One level Home Equipment: Environmental consultant - 2 wheels;Shower seat;Bedside commode;Wheelchair - manual      Prior Function Level of Independence: Independent with assistive device(s)         Comments: would need assistance with bathing, however is mod I with dressing and bed mobility/transfers; used RW when inside home. Daughter would use wheelchair when patient left the home;      Hand Dominance        Extremity/Trunk Assessment   Upper Extremity Assessment: Generalized weakness           Lower Extremity Assessment: RLE deficits/detail;LLE deficits/detail RLE Deficits / Details: RLE not tested due to pain and recent fracture LLE Deficits / Details: LLE grossly 4/5  Cervical / Trunk Assessment: Kyphotic  Communication   Communication: HOH  Cognition Arousal/Alertness: Awake/alert Behavior During Therapy: WFL for tasks assessed/performed Overall Cognitive Status: Within Functional Limits for tasks assessed (decreased ability to answer questions due to [facility]; alert to situation)                      General Comments General comments (skin integrity, edema, etc.): good skin integrity;    Exercises        Assessment/Plan    PT Assessment Patient needs continued PT services  PT Diagnosis Difficulty walking;Generalized weakness;Acute pain   PT Problem List Decreased strength;Decreased range of motion;Pain;Decreased activity tolerance;Decreased balance;Decreased mobility  PT Treatment Interventions Gait training;Patient/family education;Functional mobility training;Stair training;Therapeutic activities;Therapeutic exercise;Balance training   PT Goals (Current goals can be found in the Care Plan section) Acute Rehab PT Goals Patient Stated Goal: "I don't think I can get up." patient wants to get better and not hurt when moving leg PT Goal Formulation: With patient Time For Goal Achievement: 05/12/15 Potential to Achieve Goals:  Good    Frequency 7X/week   Barriers to discharge Inaccessible home environment has steps to enter house;     Co-evaluation               End of Session Equipment Utilized During Treatment: Gait belt Activity Tolerance: Patient limited by pain Patient left: in chair;with call bell/phone within reach;with chair alarm set;with nursing/sitter in room Nurse Communication: Mobility status    Functional Assessment Tool Used: clinical judgement Functional Limitation: Mobility: Walking and moving around Mobility: Walking and Moving Around Current Status (O6712): At least 60 percent but less than 80 percent impaired, limited or restricted Mobility: Walking and Moving Around Goal Status [phone]): At least 40 percent but less than 60 percent impaired, limited or restricted    Time: 1043-1105 PT Time Calculation (min) (ACUTE ONLY): 22 min   Charges:   PT Evaluation $Initial PT Evaluation Tier I: 1 Procedure     PT G Codes:   PT G-Codes **NOT FOR INPATIENT CLASS** Functional Assessment Tool Used: clinical judgement Functional Limitation: Mobility: Walking and moving around Mobility: Walking and Moving Around Current Status (X8338): At least 60 percent but less than 80 percent impaired, limited or restricted Mobility: Walking and Moving Around Goal Status [phone]): At least 40 percent but less than 60 percent impaired, limited or restricted    Hopkins,Aydan Phoenix 04/28/2015, 11:24 AM

## 2015-04-28 NOTE — Progress Notes (Signed)
Patient up to chair with PT this shift.  Foley removed and voiding successfully following removal.  Fluids continue until MD orders discontinuation due to low sodium levels.  TDWB to right foot.  Bucks traction on while in bed.  2liters oxygen, unable to wean.  Patient plan to discharge to [facility].

## 2015-04-28 NOTE — Clinical Social Work Placement (Signed)
   CLINICAL SOCIAL WORK PLACEMENT  NOTE  Date:  04/28/2015  Patient Details  Name: [name] MRN: [id number] Date of Birth: 1928/01/18  Clinical Social Work is seeking post-discharge placement for this patient at the Skilled  Nursing Facility level of care (*CSW will initial, date and re-position this form in  chart as items are completed):  Yes   Patient/family provided with [place] Clinical Social Work Department's list of facilities offering this level of care within the geographic area requested by the patient (or if unable, by the patient's family).  Yes   Patient/family informed of their freedom to choose among providers that offer the needed level of care, that participate in Medicare, Medicaid or managed care program needed by the patient, have an available bed and are willing to accept the patient.  Yes   Patient/family informed of [facility]'s ownership interest in [facility] and [facility], as well as of the fact that they are under no obligation to receive care at these facilities.  PASRR submitted to EDS on       PASRR number received on       Existing PASRR number confirmed on 04/28/15     FL2 transmitted to all facilities in geographic area requested by pt/family on 04/28/15     FL2 transmitted to all facilities within larger geographic area on       Patient informed that his/her managed care company has contracts with or will negotiate with certain facilities, including the following:            Patient/family informed of bed offers received.  Patient chooses bed at       Physician recommends and patient chooses bed at      Patient to be transferred to   on  .  Patient to be transferred to facility by       Patient family notified on   of transfer.  Name of family member notified:        PHYSICIAN Please sign FL2     Additional Comment:    _______________________________________________ Haig Prophet, LCSW 04/28/2015, 4:33  PM

## 2015-04-28 NOTE — Progress Notes (Signed)
Patient arrived on unit from ED. Acetabular Fx to right leg. Bucks traction placed. Patient  is alert and oriented, but forgetful at times. Skin warm and dry, per patient " I had a bed sore on butt".Stage 1 on coccyx. Foam dressing placed. Lungs clear. 2L Spo2  intact. Voiding without difficulty foley in place. Abdomen distended bowel sounds active. Call bell within reach

## 2015-04-28 NOTE — Progress Notes (Signed)
[facility]  PROGRESS NOTE Date of Admission:  04/27/2015     ID: [name] is a 81 y.o. female with acetabular  fx after fall Principal Problem:   Acetabular fracture Active Problems:   Pressure ulcer  Subjective: Got oob to chair today. Having some cough.    ROS  Eleven systems are reviewed and negative except per hpi  Medications:  Antibiotics Given (last 72 hours)    None     . aspirin EC  81 mg Oral Daily  . calcium-vitamin D  1 tablet Oral Daily  . guaiFENesin  600 mg Oral BID  . heparin  5,000 Units Subcutaneous 3 times per day  . levothyroxine  25 mcg Oral QAC breakfast  . metoCLOPramide  5 mg Oral TID AC  . mirtazapine  30 mg Oral QHS  . pantoprazole  40 mg Oral BID    Objective: Vital signs in last 24 hours: Temp:  [98.1 F (36.7 C)-99.7 F (37.6 C)] 98.1 F (36.7 C) (07/04 1513) Pulse Rate:  [84-109] 84 (07/04 1513) Resp:  [16-41] 18 (07/04 1513) BP: (109-164)/(45-80) 124/45 mmHg (07/04 1513) SpO2:  [93 %-98 %] 97 % (07/04 1513) Weight:  [51.256 kg (113 lb)] 51.256 kg (113 lb) (07/04 0801)  GENERAL:81 y.o.female currently in no acute distress.  HEAD: Normocephalic, atraumatic.  EYES: Pupils equal, round, reactive to light. Extraocular muscles intact. No scleral icterus.  MOUTH: Moist mucosal membrane. Dentition intact. No abscess noted.  EAR, NOSE, THROAT: Clear without exudates. No external lesions.  NECK: Supple. No thyromegaly. No nodules. No JVD.  PULMONARY: Clear to ascultation, without wheeze rails or rhonci. No use of accessory muscles, Good respiratory effort. good air entry bilaterally CHEST: Nontender to palpation.  CARDIOVASCULAR: S1 and S2. Regular rate and rhythm. 3/6 systolic ejection murmur, rubs, or gallops. No edema. Pedal pulses 2+ bilaterally.  GASTROINTESTINAL: Soft, nontender, nondistended. No masses. Positive bowel sounds. No hepatosplenomegaly.  MUSCULOSKELETAL: No swelling, clubbing, or edema. Range of motion  limited right lower extremity given fracture NEUROLOGIC: Cranial nerves II through XII are intact. No gross focal neurological deficits. Sensation intact. Reflexes intact.  SKIN: No ulceration, lesions, rashes, or cyanosis. Skin warm and dry. Turgor intact.  PSYCHIATRIC: Mood, affect within normal limits. The patient is awake, alert and oriented x 3. Insight, judgment intact Lab Results  Recent Labs  04/27/15 1326 04/27/15 1431  WBC  --  9.2  HGB  --  11.2*  HCT  --  33.4*  NA 131*  --   K 4.2  --   CL 97*  --   CO2 26  --   BUN 18  --   CREATININE 0.92  --     Microbiology: @ Studies/Results: Ct Abdomen Pelvis W Contrast  04/27/2015   CLINICAL DATA:  Fall on RIGHT hip this morning, known pelvic fractures, injury  EXAM: CT ABDOMEN AND PELVIS WITH CONTRAST  TECHNIQUE: Multidetector CT imaging of the abdomen and pelvis was performed using the standard protocol following bolus administration of intravenous contrast. Sagittal and coronal MPR images reconstructed from axial data set.  CONTRAST:  80mL OMNIPAQUE IOHEXOL 300 MG/ML SOLN IV. No oral contrast administered.  COMPARISON:  02/18/2012  FINDINGS: Linear subsegmental atelectasis medial LEFT lower lobe.  Few tiny liver cysts.  Gallbladder surgically absent with mild stable intrahepatic biliary dilatation.  Liver, spleen, pancreas, and adrenal glands normal.  Symmetric nephrograms with small cyst at inferior pole LEFT kidney.  IVC filter.  Scattered atherosclerotic calcifications.  Uterus  surgically absent with nonvisualization of ovaries.  Normal appearing bladder and ureters.  Appendix not definitely visualized.  Moderate-sized hiatal hernia.  Stomach and bowel loops otherwise grossly normal.  No mass, adenopathy, free fluid or free air.  Bones diffusely demineralized.  No acute lumbar spine abnormalities.  Callus identified at old fractures of the RIGHT superior and inferior pubic rami with superimposed acute nondisplaced fractures.   Ccomminuted fracture of medial wall of RIGHT acetabulum seen with extension into roof.  Anterior column RIGHT acetabulum grossly intact.  Fracture through the cranial aspect of the posterior column RIGHT acetabulum, nondisplaced.  No acute LEFT pelvic abnormalities.  Asymmetric iliac sclerosis at RIGHT SI joint.  Deformity of RIGHT sent lateral sacral ala consistent with old healed fracture.  IMPRESSION: Osseous demineralization with a complex fracture involving the medial and superior walls of the RIGHT acetabulum extending through the superior aspect of the posterior column.  Additional acute fractures through callus at old healed fractures of the RIGHT superior and inferior pubic rami.  Old healed RIGHT sacral fracture.  Moderate-sized hiatal hernia.  No acute intra-abdominal or intrapelvic abnormalities.   Electronically Signed   By: Ulyses Southward M.D.   On: 04/27/2015 16:12   Dg Pelvis Comp Min 3v  04/27/2015   CLINICAL DATA:  Right acetabular fracture.  EXAM: JUDET PELVIS - 3+ VIEW  COMPARISON:  CT scan dated 04/27/2015  FINDINGS: There is a comminuted acute fracture of the right acetabulum and there are old incompletely healed fractures of right superior and inferior pubic rami. There is slight over riding of the posterior medial component of the acetabular fracture. There is slight displacement and overriding of the superior acetabular fractures.  Diffuse osteopenia. Old healed fracture of the proximal femur with compression screw and sideplate in place.  IMPRESSION: Slightly displaced acute right acetabular fractures. Incompletely healed right inferior and superior pubic ramus fracture superior   Electronically Signed   By: Francene Boyers M.D.   On: 04/27/2015 19:13   Dg Hip Unilat With Pelvis 2-3 Views Right  04/27/2015   CLINICAL DATA:  Unwitnessed fall this morning, loss of consciousness, woke up on floor, RIGHT leg pain, increased pain with movement, shortening, history of RIGHT hip surgery x 2  EXAM:  RIGHT HIP (WITH PELVIS) 2-3 VIEWS  COMPARISON:  11/22/2005  FINDINGS: Lateral plate with compression screw proximal RIGHT femur post ORIF of an intertrochanteric fracture with an additional IM nail at the mid to distal RIGHT femur.  Hardware appears intact.  No femoral head dislocation.  Displaced fractures involving RIGHT superior and inferior pubic rami as well as the RIGHT acetabulum.  Minimal sclerosis at RIGHT SI joint greater than LEFT.  No additional pelvic fractures identified.  Significant atherosclerotic calcifications.  IMPRESSION: Multiple RIGHT pelvic fractures including superior and inferior pubic rami as well as involvement of RIGHT acetabulum.  Recommend further assessment by CT.  Prior ORIF proximal and distal RIGHT femur.   Electronically Signed   By: Ulyses Southward M.D.   On: 04/27/2015 14:07    Assessment/Plan: 81 year old Caucasian female history of hypothyroidism unspecified, rheumatoid arthritis presenting after a fall found to have right acetabular fracture. 1. Right acetabular fracture, closed,: Patient evaluated by orthopedics, deemed nonoperative. Provide pain control is required as well as physical therapy patient will likely require placement, initiate bowel regimen as well 2. Hyponatremia: IV fluid hydration normal saline follow sodium levels 3. Hypothyroidism unspecified: Synthroid 4. GERD. Esophagitis: PPI therapy 5. Cough - check cxr, had abx recently as  otpt Venous thromboembolism prophylactic: Hepari n subcutaneous SW consult for SNF  Yannis Broce   04/28/2015, 3:19 PM

## 2015-04-29 ENCOUNTER — Other Ambulatory Visit: Payer: Self-pay | Admitting: Gerontology

## 2015-04-29 ENCOUNTER — Observation Stay
Admission: RE | Admit: 2015-04-29 | Discharge: 2015-04-29 | Disposition: A | Discharge status: Home / Self Care | Payer: Medicare Other | Source: Ambulatory Visit | Attending: Gerontology | Admitting: Gerontology

## 2015-04-29 ENCOUNTER — Encounter
Admission: RE | Admit: 2015-04-29 | Discharge: 2015-04-29 | Disposition: A | Discharge status: Home / Self Care | Payer: Medicare Other | Source: Ambulatory Visit | Attending: Internal Medicine | Admitting: Internal Medicine

## 2015-04-29 DIAGNOSIS — R7981 Abnormal blood-gas level: Secondary | ICD-10-CM

## 2015-04-29 DIAGNOSIS — R059 Cough, unspecified: Secondary | ICD-10-CM

## 2015-04-29 DIAGNOSIS — R0603 Acute respiratory distress: Secondary | ICD-10-CM

## 2015-04-29 DIAGNOSIS — S32441A Displaced fracture of posterior column [ilioischial] of right acetabulum, initial encounter for closed fracture: Secondary | ICD-10-CM | POA: Diagnosis not present

## 2015-04-29 DIAGNOSIS — J449 Chronic obstructive pulmonary disease, unspecified: Secondary | ICD-10-CM

## 2015-04-29 DIAGNOSIS — Z8781 Personal history of (healed) traumatic fracture: Secondary | ICD-10-CM

## 2015-04-29 DIAGNOSIS — R05 Cough: Secondary | ICD-10-CM

## 2015-04-29 LAB — BASIC METABOLIC PANEL
Anion gap: 5 (ref 5–15)
BUN: 13 mg/dL (ref 6–20)
CO2: 27 mmol/L (ref 22–32)
Calcium: 8.4 mg/dL — ABNORMAL LOW (ref 8.9–10.3)
Chloride: 104 mmol/L (ref 101–111)
Creatinine, Ser: 0.82 mg/dL (ref 0.44–1.00)
Glucose, Bld: 119 mg/dL — ABNORMAL HIGH (ref 65–99)
POTASSIUM: 4 mmol/L (ref 3.5–5.1)
SODIUM: 136 mmol/L (ref 135–145)

## 2015-04-29 MED ORDER — OXYCODONE HCL 5 MG PO TABS: 5.0000 mg | ORAL_TABLET | ORAL | Status: DC | PRN

## 2015-04-29 MED ORDER — GUAIFENESIN ER 600 MG PO TB12: 600.0000 mg | ORAL_TABLET | Freq: Two times a day (BID) | ORAL | Status: AC

## 2015-04-29 MED ORDER — IPRATROPIUM-ALBUTEROL 0.5-2.5 (3) MG/3ML IN SOLN: 3.0000 mL | Freq: Four times a day (QID) | RESPIRATORY_TRACT | Status: DC

## 2015-04-29 MED ORDER — IPRATROPIUM-ALBUTEROL 0.5-2.5 (3) MG/3ML IN SOLN: 3.0000 mL | Freq: Four times a day (QID) | RESPIRATORY_TRACT | Status: AC

## 2015-04-29 MED ORDER — ASPIRIN 81 MG PO TBEC: 81.0000 mg | DELAYED_RELEASE_TABLET | Freq: Every day | ORAL | Status: AC

## 2015-04-29 MED ORDER — ONDANSETRON HCL 4 MG PO TABS: 4.0000 mg | ORAL_TABLET | Freq: Four times a day (QID) | ORAL | Status: DC | PRN

## 2015-04-29 MED ORDER — IOHEXOL 350 MG/ML SOLN
75.0000 mL | Freq: Once | INTRAVENOUS | Status: AC | PRN
  Administered 2015-04-29: 75 mL via INTRAVENOUS

## 2015-04-29 MED ORDER — POLYETHYLENE GLYCOL 3350 17 G PO PACK: 17.0000 g | PACK | Freq: Every day | ORAL | Status: AC | PRN

## 2015-04-29 MED ORDER — ALPRAZOLAM 0.25 MG PO TABS: 0.2500 mg | ORAL_TABLET | Freq: Two times a day (BID) | ORAL | Status: AC | PRN

## 2015-04-29 MED ORDER — HEPARIN SODIUM (PORCINE) 5000 UNIT/ML IJ SOLN: 5000.0000 [IU] | Freq: Three times a day (TID) | INTRAMUSCULAR | Status: DC

## 2015-04-29 MED ORDER — ACETAMINOPHEN 325 MG PO TABS: 650.0000 mg | ORAL_TABLET | Freq: Four times a day (QID) | ORAL | Status: AC | PRN

## 2015-04-29 NOTE — Clinical Social Work Placement (Signed)
   CLINICAL SOCIAL WORK PLACEMENT  NOTE  Date:  04/29/2015  Patient Details  Name: [name] MRN: [id number] Date of Birth: 08-20-1928  Clinical Social Work is seeking post-discharge placement for this patient at the Skilled  Nursing Facility level of care (*CSW will initial, date and re-position this form in  chart as items are completed):  Yes   Patient/family provided with [place] Clinical Social Work Department's list of facilities offering this level of care within the geographic area requested by the patient (or if unable, by the patient's family).  Yes   Patient/family informed of their freedom to choose among providers that offer the needed level of care, that participate in Medicare, Medicaid or managed care program needed by the patient, have an available bed and are willing to accept the patient.  Yes   Patient/family informed of [facility]'s ownership interest in [facility] and [facility], as well as of the fact that they are under no obligation to receive care at these facilities.  PASRR submitted to EDS on       PASRR number received on       Existing PASRR number confirmed on 04/28/15     FL2 transmitted to all facilities in geographic area requested by pt/family on 04/28/15     FL2 transmitted to all facilities within larger geographic area on       Patient informed that his/her managed care company has contracts with or will negotiate with certain facilities, including the following:        Yes   Patient/family informed of bed offers received.  Patient chooses bed at  [facility] )     Physician recommends and patient chooses bed at      Patient to be transferred to  [facility] ) on 04/29/15.  Patient to be transferred to facility by  [facility] )     Patient family notified on 04/29/15 of transfer.  Name of family member notified:   (Daughter Marylu Lund at bedside and aware of D/C. )     PHYSICIAN       Additional  Comment:    _______________________________________________ Haig Prophet, LCSW 04/29/2015, 10:53 AM

## 2015-04-29 NOTE — Progress Notes (Signed)
Patient now coughing up moderate  amounts of blood clots. Dr. Sheryle Hail aware will come to assess patient. Nursing Supervisor and charge nurse paged.

## 2015-04-29 NOTE — Progress Notes (Signed)
Patient is stable. No acute distress noted.No complaints of  Bleeding coughing or nasal pain. 2L SPo2 with humidifier [place] intact.

## 2015-04-29 NOTE — Progress Notes (Signed)
Patient is medically stable for D/C to [facility] today. Per Kim admissions coordinator at [facility] patient is going to room 216-A. RN will call report at [phone] and arrange EMS for transport after lunch per Selena Batten. Clinical Child psychotherapist (CSW) prepared D/C packet and sent D/C summary and follow up appointments to [facility] via carefinder. Patient's daughter Marylu Lund is at bedside and aware of above. Daughter is going to meet Kim at [facility] around 11 am and complete admissions paper work. Please reconsult if future social work needs arise. CSW signing off.   Jetta Lout, LCSWA [phone]

## 2015-04-29 NOTE — Progress Notes (Signed)
Subjective:  Patient with minimally displaced acetabular fracture. Seen initially by Dr. Astrid Drafts. Patient reports pain as moderate.  Patient in Buck's traction on the right leg while in bed. She is instructed for touchdown weightbearing only on the right lower extremity.  Objective:   VITALS:   Filed Vitals:   04/29/15 0523 04/29/15 0732 04/29/15 0741 04/29/15 0743  BP: 97/43  111/43 110/51  Pulse: 87  90   Temp: 98.3 F (36.8 C)  98.8 F (37.1 C)   TempSrc: Oral  Oral   Resp: 18  19   Height:      Weight:      SpO2: 91% 93% 93%    Right lower extremity: Neurovascular intact Sensation intact distally Dorsiflexion/Plantar flexion intact Compartment soft  Pedal pulses are palpable. Patient has tenderness over the anterior hip joint and pubic rami.  LABS  Results for orders placed or performed during the hospital encounter of 04/27/15 (from the past 24 hour(s))  Basic metabolic panel     Status: Abnormal   Collection Time: 04/29/15  4:57 AM  Result Value Ref Range   Sodium 136 135 - 145 mmol/L   Potassium 4.0 3.5 - 5.1 mmol/L   Chloride 104 101 - 111 mmol/L   CO2 27 22 - 32 mmol/L   Glucose, Bld 119 (H) 65 - 99 mg/dL   BUN 13 6 - 20 mg/dL   Creatinine, Ser 2.29 0.44 - 1.00 mg/dL   Calcium 8.4 (L) 8.9 - 10.3 mg/dL   GFR calc non Af Amer >60 >60 mL/min   GFR calc Af Amer >60 >60 mL/min   Anion gap 5 5 - 15    Ct Abdomen Pelvis W Contrast  04/27/2015   CLINICAL DATA:  Fall on RIGHT hip this morning, known pelvic fractures, injury  EXAM: CT ABDOMEN AND PELVIS WITH CONTRAST  TECHNIQUE: Multidetector CT imaging of the abdomen and pelvis was performed using the standard protocol following bolus administration of intravenous contrast. Sagittal and coronal MPR images reconstructed from axial data set.  CONTRAST:  79mL OMNIPAQUE IOHEXOL 300 MG/ML SOLN IV. No oral contrast administered.  COMPARISON:  02/18/2012  FINDINGS: Linear subsegmental atelectasis medial LEFT lower lobe.  Few  tiny liver cysts.  Gallbladder surgically absent with mild stable intrahepatic biliary dilatation.  Liver, spleen, pancreas, and adrenal glands normal.  Symmetric nephrograms with small cyst at inferior pole LEFT kidney.  IVC filter.  Scattered atherosclerotic calcifications.  Uterus surgically absent with nonvisualization of ovaries.  Normal appearing bladder and ureters.  Appendix not definitely visualized.  Moderate-sized hiatal hernia.  Stomach and bowel loops otherwise grossly normal.  No mass, adenopathy, free fluid or free air.  Bones diffusely demineralized.  No acute lumbar spine abnormalities.  Callus identified at old fractures of the RIGHT superior and inferior pubic rami with superimposed acute nondisplaced fractures.  Ccomminuted fracture of medial wall of RIGHT acetabulum seen with extension into roof.  Anterior column RIGHT acetabulum grossly intact.  Fracture through the cranial aspect of the posterior column RIGHT acetabulum, nondisplaced.  No acute LEFT pelvic abnormalities.  Asymmetric iliac sclerosis at RIGHT SI joint.  Deformity of RIGHT sent lateral sacral ala consistent with old healed fracture.  IMPRESSION: Osseous demineralization with a complex fracture involving the medial and superior walls of the RIGHT acetabulum extending through the superior aspect of the posterior column.  Additional acute fractures through callus at old healed fractures of the RIGHT superior and inferior pubic rami.  Old healed RIGHT sacral fracture.  Moderate-sized hiatal hernia.  No acute intra-abdominal or intrapelvic abnormalities.   Electronically Signed   By: Ulyses Southward M.D.   On: 04/27/2015 16:12   Dg Pelvis Comp Min 3v  04/27/2015   CLINICAL DATA:  Right acetabular fracture.  EXAM: JUDET PELVIS - 3+ VIEW  COMPARISON:  CT scan dated 04/27/2015  FINDINGS: There is a comminuted acute fracture of the right acetabulum and there are old incompletely healed fractures of right superior and inferior pubic rami.  There is slight over riding of the posterior medial component of the acetabular fracture. There is slight displacement and overriding of the superior acetabular fractures.  Diffuse osteopenia. Old healed fracture of the proximal femur with compression screw and sideplate in place.  IMPRESSION: Slightly displaced acute right acetabular fractures. Incompletely healed right inferior and superior pubic ramus fracture superior   Electronically Signed   By: Francene Boyers M.D.   On: 04/27/2015 19:13   Dg Chest Port 1 View  04/28/2015   CLINICAL DATA:  Cough for several days. Hypertension. History stroke.  EXAM: PORTABLE CHEST - 1 VIEW  COMPARISON:  02/04/2015  FINDINGS: Midline trachea. Mild cardiomegaly with atherosclerosis in the transverse aorta. Biapical pleural thickening. Mild left base scarring. No lobar consolidation.  IMPRESSION: No acute process or explanation for cough.  Mild cardiomegaly with aortic atherosclerosis.   Electronically Signed   By: Jeronimo Greaves M.D.   On: 04/28/2015 16:11   Dg Hip Unilat With Pelvis 2-3 Views Right  04/27/2015   CLINICAL DATA:  Unwitnessed fall this morning, loss of consciousness, woke up on floor, RIGHT leg pain, increased pain with movement, shortening, history of RIGHT hip surgery x 2  EXAM: RIGHT HIP (WITH PELVIS) 2-3 VIEWS  COMPARISON:  11/22/2005  FINDINGS: Lateral plate with compression screw proximal RIGHT femur post ORIF of an intertrochanteric fracture with an additional IM nail at the mid to distal RIGHT femur.  Hardware appears intact.  No femoral head dislocation.  Displaced fractures involving RIGHT superior and inferior pubic rami as well as the RIGHT acetabulum.  Minimal sclerosis at RIGHT SI joint greater than LEFT.  No additional pelvic fractures identified.  Significant atherosclerotic calcifications.  IMPRESSION: Multiple RIGHT pelvic fractures including superior and inferior pubic rami as well as involvement of RIGHT acetabulum.  Recommend further  assessment by CT.  Prior ORIF proximal and distal RIGHT femur.   Electronically Signed   By: Ulyses Southward M.D.   On: 04/27/2015 14:07    Assessment/Plan:     Principal Problem:   Acetabular fracture Active Problems:   Pressure ulcer  Continue physical therapy/occupational therapy. Patient will be touchdown weightbearing for the next 6 weeks. She is okay for discharge from an orthopedic standpoint when cleared by medicine. I will see her back in the office in 2 weeks.    Juanell Fairly , MD 04/29/2015, 1:19 PM

## 2015-04-29 NOTE — Discharge Instructions (Signed)
Needs PT, O2, and traction as prescribed

## 2015-04-29 NOTE — Progress Notes (Signed)
Patient complains of cough. Once entered room noted scant amount of blood in tissue. Mouth assessment  completed no mouth sores or acute distress noted. VSS and 2L Spo2 [place] intact. MD paged awaiting response.

## 2015-04-29 NOTE — Discharge Summary (Signed)
[name], is a 81 y.o. female  DOB [birth year]  MRN [id number].  Admission date:  04/27/2015  Admitting Physician  Wyatt Haste, MD  Discharge Date:  04/29/2015   Primary MD  No primary care provider on file.  Recommendations for primary care physician for things to follow:   none   Admission Diagnosis  Fracture [T14.8] Multiple pelvic fractures, closed, initial encounter [S32.810A]   Discharge Diagnosis  Fracture [T14.8] Multiple pelvic fractures, closed, initial encounter [S32.810A]  COPD with hypoxia  Principal Problem:   Acetabular fracture Active Problems:   Pressure ulcer      Past Medical History  Diagnosis Date  . Anxiety   . HTN (hypertension)   . Stroke   . Depression   . Aortic stenosis   . ASD (atrial septal defect)   . Osteoporosis, postmenopausal   . AVM (arteriovenous malformation)     pulmonary, s/p cautery  . Anxiety   . RA (rheumatoid arthritis)   . Chronic obstruct airways disease   . Bronchitis     Past Surgical History  Procedure Laterality Date  . Cholecystectomy    . Femur fracture surgery    . Total abdominal hysterectomy         History of present illness and  Hospital Course:     Kindly see H&P for history of present illness and admission details, please review complete Labs, Consult reports and Test reports for all details in brief  HPI  from the history and physical done on the day of admission    Hospital Course    Pt admitted with fall and resultant pelvic and right acetabular fracture. Non-operative. Seen by Ortho and PT. Rehab recommended. Pt and family agree.   Discharge Condition: stable   Follow UP  Follow-up Information    Follow up with Dayzha Pogosyan D, MD In 1 week.   Specialty:  Internal Medicine   Contact information:   [address] [place] [address] [phone]         Discharge Instructions   and  Discharge Medications   Admit to SNF for rehab     Medication List    STOP taking these medications        potassium chloride SA 20 MEQ tablet  Commonly known as:  K-DUR,KLOR-CON      TAKE these medications        acetaminophen 325 MG tablet  Commonly known as:  TYLENOL  Take 2 tablets (650 mg total) by mouth every 6 (six) hours as needed for mild pain (or Fever >/= 101).     ALPRAZolam 0.25 MG tablet  Commonly known as:  XANAX  Take 1 tablet (0.25 mg total) by mouth 2 (two) times daily as needed for anxiety.     aspirin 81 MG EC tablet  Take 1 tablet (81 mg total) by mouth daily.     CALCIUM 500/D 500-200 MG-UNIT per tablet  Generic drug:  calcium-vitamin D  Take 1 tablet by mouth daily.  fluticasone 50 MCG/ACT nasal spray  Commonly known as:  FLONASE  Place 2 sprays into both nostrils daily as needed. For allergies     guaiFENesin 600 MG 12 hr tablet  Commonly known as:  MUCINEX  Take 1 tablet (600 mg total) by mouth 2 (two) times daily.     heparin 5000 UNIT/ML injection  Inject 1 mL (5,000 Units total) into the skin every 8 (eight) hours.     ipratropium-albuterol 0.5-2.5 (3) MG/3ML Soln  Commonly known as:  DUONEB  Take 3 mLs by nebulization 4 (four) times daily.     levothyroxine 25 MCG tablet  Commonly known as:  SYNTHROID, LEVOTHROID  Take 25 mcg by mouth daily. Take on an empty stomach with a glass of water at least 30-60 minutes before breakfast.     metoCLOPramide 5 MG tablet  Commonly known as:  REGLAN  Take 5 mg by mouth 3 (three) times daily before meals.     mirtazapine 30 MG tablet  Commonly known as:  REMERON  Take 30 mg by mouth at bedtime.     ondansetron 4 MG tablet  Commonly known as:  ZOFRAN  Take 1 tablet (4 mg total) by mouth every 6 (six) hours as needed for nausea.     oxyCODONE 5 MG immediate release tablet  Commonly known as:  Oxy IR/ROXICODONE  Take 1 tablet (5 mg total) by mouth every 4 (four) hours as needed for  moderate pain.     pantoprazole 40 MG tablet  Commonly known as:  PROTONIX  Take 40 mg by mouth 2 (two) times daily.     polyethylene glycol packet  Commonly known as:  MIRALAX / GLYCOLAX  Take 17 g by mouth daily as needed for mild constipation.     VOLTAREN 1 % Gel  Generic drug:  diclofenac sodium  Apply 4 g topically every 6 (six) hours as needed. Apply 4 grams topically to knee every 6 hours as needed for pain.          Diet and Activity recommendation: See Discharge Instructions above   Consults obtained - Ortho, PT, CSW   Major procedures and Radiology Reports - PLEASE review detailed and final reports for all details, in brief -   See below   Ct Abdomen Pelvis W Contrast  04/27/2015   CLINICAL DATA:  Fall on RIGHT hip this morning, known pelvic fractures, injury  EXAM: CT ABDOMEN AND PELVIS WITH CONTRAST  TECHNIQUE: Multidetector CT imaging of the abdomen and pelvis was performed using the standard protocol following bolus administration of intravenous contrast. Sagittal and coronal MPR images reconstructed from axial data set.  CONTRAST:  74mL OMNIPAQUE IOHEXOL 300 MG/ML SOLN IV. No oral contrast administered.  COMPARISON:  02/18/2012  FINDINGS: Linear subsegmental atelectasis medial LEFT lower lobe.  Few tiny liver cysts.  Gallbladder surgically absent with mild stable intrahepatic biliary dilatation.  Liver, spleen, pancreas, and adrenal glands normal.  Symmetric nephrograms with small cyst at inferior pole LEFT kidney.  IVC filter.  Scattered atherosclerotic calcifications.  Uterus surgically absent with nonvisualization of ovaries.  Normal appearing bladder and ureters.  Appendix not definitely visualized.  Moderate-sized hiatal hernia.  Stomach and bowel loops otherwise grossly normal.  No mass, adenopathy, free fluid or free air.  Bones diffusely demineralized.  No acute lumbar spine abnormalities.  Callus identified at old fractures of the RIGHT superior and inferior  pubic rami with superimposed acute nondisplaced fractures.  Ccomminuted fracture of medial wall of RIGHT  acetabulum seen with extension into roof.  Anterior column RIGHT acetabulum grossly intact.  Fracture through the cranial aspect of the posterior column RIGHT acetabulum, nondisplaced.  No acute LEFT pelvic abnormalities.  Asymmetric iliac sclerosis at RIGHT SI joint.  Deformity of RIGHT sent lateral sacral ala consistent with old healed fracture.  IMPRESSION: Osseous demineralization with a complex fracture involving the medial and superior walls of the RIGHT acetabulum extending through the superior aspect of the posterior column.  Additional acute fractures through callus at old healed fractures of the RIGHT superior and inferior pubic rami.  Old healed RIGHT sacral fracture.  Moderate-sized hiatal hernia.  No acute intra-abdominal or intrapelvic abnormalities.   Electronically Signed   By: Ulyses Southward M.D.   On: 04/27/2015 16:12   Dg Pelvis Comp Min 3v  04/27/2015   CLINICAL DATA:  Right acetabular fracture.  EXAM: JUDET PELVIS - 3+ VIEW  COMPARISON:  CT scan dated 04/27/2015  FINDINGS: There is a comminuted acute fracture of the right acetabulum and there are old incompletely healed fractures of right superior and inferior pubic rami. There is slight over riding of the posterior medial component of the acetabular fracture. There is slight displacement and overriding of the superior acetabular fractures.  Diffuse osteopenia. Old healed fracture of the proximal femur with compression screw and sideplate in place.  IMPRESSION: Slightly displaced acute right acetabular fractures. Incompletely healed right inferior and superior pubic ramus fracture superior   Electronically Signed   By: Francene Boyers M.D.   On: 04/27/2015 19:13   Dg Chest Port 1 View  04/28/2015   CLINICAL DATA:  Cough for several days. Hypertension. History stroke.  EXAM: PORTABLE CHEST - 1 VIEW  COMPARISON:  02/04/2015  FINDINGS: Midline  trachea. Mild cardiomegaly with atherosclerosis in the transverse aorta. Biapical pleural thickening. Mild left base scarring. No lobar consolidation.  IMPRESSION: No acute process or explanation for cough.  Mild cardiomegaly with aortic atherosclerosis.   Electronically Signed   By: Jeronimo Greaves M.D.   On: 04/28/2015 16:11   Dg Hip Unilat With Pelvis 2-3 Views Right  04/27/2015   CLINICAL DATA:  Unwitnessed fall this morning, loss of consciousness, woke up on floor, RIGHT leg pain, increased pain with movement, shortening, history of RIGHT hip surgery x 2  EXAM: RIGHT HIP (WITH PELVIS) 2-3 VIEWS  COMPARISON:  11/22/2005  FINDINGS: Lateral plate with compression screw proximal RIGHT femur post ORIF of an intertrochanteric fracture with an additional IM nail at the mid to distal RIGHT femur.  Hardware appears intact.  No femoral head dislocation.  Displaced fractures involving RIGHT superior and inferior pubic rami as well as the RIGHT acetabulum.  Minimal sclerosis at RIGHT SI joint greater than LEFT.  No additional pelvic fractures identified.  Significant atherosclerotic calcifications.  IMPRESSION: Multiple RIGHT pelvic fractures including superior and inferior pubic rami as well as involvement of RIGHT acetabulum.  Recommend further assessment by CT.  Prior ORIF proximal and distal RIGHT femur.   Electronically Signed   By: Ulyses Southward M.D.   On: 04/27/2015 14:07    Micro Results   none  No results found for this or any previous visit (from the past 240 hour(s)).     Today   Subjective:   [name] today has no headache,no chest abdominal pain,no new weakness tingling or numbness, feels much better wants to go to SNF today. Remains on O2 and traction while in bed(per Ortho).  Objective:   Blood pressure 97/43, pulse  87, temperature 98.3 F (36.8 C), temperature source Oral, resp. rate 18, height 5\' 5"  (1.651 m), weight 51.256 kg (113 lb), SpO2 91 %.   Intake/Output Summary (Last 24  hours) at 04/29/15 0735 Last data filed at 04/29/15 0523  Gross per 24 hour  Intake 1372.5 ml  Output   1148 ml  Net  224.5 ml    Exam Awake Alert, Oriented x 3, No new F.N deficits, Normal affect [place].AT,PERRAL Supple Neck,No JVD, No cervical lymphadenopathy appriciated.  Symmetrical Chest wall movement, Good air movement bilaterally, scattered rhonchi RRR,No Gallops,Rubs or new Murmurs, No Parasternal Heave +ve B.Sounds, Abd Soft, Non tender, No organomegaly appriciated, No rebound -guarding or rigidity. No Cyanosis, Clubbing or edema, No new Rash or bruise  Data Review   CBC w Diff: Lab Results  Component Value Date   WBC 9.2 04/27/2015   WBC 7.6 02/06/2015   HGB 11.2* 04/27/2015   HGB 11.7* 02/06/2015   HCT 33.4* 04/27/2015   HCT 35.4 02/06/2015   PLT 142* 04/27/2015   PLT 147* 02/06/2015   LYMPHOPCT 9 04/27/2015   LYMPHOPCT 24.1 02/06/2015   MONOPCT 3 04/27/2015   MONOPCT 5.8 02/06/2015   EOSPCT 3 04/27/2015   EOSPCT 1.9 02/06/2015   BASOPCT 1 04/27/2015   BASOPCT 0.3 02/06/2015    CMP: Lab Results  Component Value Date   NA 136 04/29/2015   NA 142 02/06/2015   K 4.0 04/29/2015   K 3.5 02/06/2015   CL 104 04/29/2015   CL 103 02/06/2015   CO2 27 04/29/2015   CO2 31 02/06/2015   BUN 13 04/29/2015   BUN 19 02/06/2015   CREATININE 0.82 04/29/2015   CREATININE 0.98 02/06/2015   PROT 6.7 04/27/2015   PROT 7.1 04/06/2014   ALBUMIN 3.7 04/27/2015   ALBUMIN 3.2* 04/06/2014   BILITOT 0.5 04/27/2015   ALKPHOS 59 04/27/2015   ALKPHOS 78 04/06/2014   AST 22 04/27/2015   AST 24 04/06/2014   ALT 15 04/27/2015   ALT 12 04/06/2014  .   Total Time in preparing paper work, data evaluation and todays exam - 45 minutes  Tangy Drozdowski D M.D on 04/29/2015 at 7:35 AM

## 2015-04-29 NOTE — Progress Notes (Signed)
Pts VSS. Report called to [employer], at [facility]. Pt transported via EMS.

## 2015-04-29 NOTE — Progress Notes (Signed)
Report given to Lucendia Herrlich, RN at [facility]

## 2015-05-11 ENCOUNTER — Other Ambulatory Visit
Admission: RE | Admit: 2015-05-11 | Discharge: 2015-05-11 | Disposition: A | Discharge status: Home / Self Care | Payer: Medicare Other | Source: Ambulatory Visit | Attending: Internal Medicine | Admitting: Internal Medicine

## 2015-05-11 DIAGNOSIS — R8299 Other abnormal findings in urine: Secondary | ICD-10-CM | POA: Insufficient documentation

## 2015-05-11 DIAGNOSIS — R350 Frequency of micturition: Secondary | ICD-10-CM | POA: Insufficient documentation

## 2015-05-11 LAB — URINALYSIS COMPLETE WITH MICROSCOPIC (ARMC ONLY)
Bilirubin Urine: NEGATIVE
Glucose, UA: NEGATIVE mg/dL
KETONES UR: NEGATIVE mg/dL
Nitrite: POSITIVE — AB
PH: 5 (ref 5.0–8.0)
PROTEIN: NEGATIVE mg/dL
Specific Gravity, Urine: 1.014 (ref 1.005–1.030)

## 2015-05-14 LAB — URINE CULTURE: Culture: >=100000

## 2015-05-26 ENCOUNTER — Encounter
Admission: RE | Admit: 2015-05-26 | Discharge: 2015-05-26 | Disposition: A | Discharge status: Home / Self Care | Payer: Medicare Other | Source: Ambulatory Visit | Attending: Internal Medicine | Admitting: Internal Medicine

## 2015-07-03 ENCOUNTER — Emergency Department
Admission: EM | Admit: 2015-07-03 | Discharge: 2015-07-03 | Disposition: A | Discharge status: Home / Self Care | Payer: Medicare Other | Attending: Emergency Medicine | Admitting: Emergency Medicine

## 2015-07-03 ENCOUNTER — Encounter: Payer: Self-pay | Admitting: Emergency Medicine

## 2015-07-03 ENCOUNTER — Emergency Department: Payer: Medicare Other

## 2015-07-03 DIAGNOSIS — M25561 Pain in right knee: Secondary | ICD-10-CM

## 2015-07-03 DIAGNOSIS — W1839XA Other fall on same level, initial encounter: Secondary | ICD-10-CM | POA: Diagnosis not present

## 2015-07-03 DIAGNOSIS — S42002A Fracture of unspecified part of left clavicle, initial encounter for closed fracture: Secondary | ICD-10-CM | POA: Diagnosis not present

## 2015-07-03 DIAGNOSIS — Y9289 Other specified places as the place of occurrence of the external cause: Secondary | ICD-10-CM | POA: Diagnosis not present

## 2015-07-03 DIAGNOSIS — I10 Essential (primary) hypertension: Secondary | ICD-10-CM | POA: Diagnosis not present

## 2015-07-03 DIAGNOSIS — S52692A Other fracture of lower end of left ulna, initial encounter for closed fracture: Secondary | ICD-10-CM | POA: Diagnosis not present

## 2015-07-03 DIAGNOSIS — Z7982 Long term (current) use of aspirin: Secondary | ICD-10-CM | POA: Insufficient documentation

## 2015-07-03 DIAGNOSIS — S4992XA Unspecified injury of left shoulder and upper arm, initial encounter: Secondary | ICD-10-CM | POA: Diagnosis present

## 2015-07-03 DIAGNOSIS — S62102A Fracture of unspecified carpal bone, left wrist, initial encounter for closed fracture: Secondary | ICD-10-CM

## 2015-07-03 DIAGNOSIS — Y998 Other external cause status: Secondary | ICD-10-CM | POA: Diagnosis not present

## 2015-07-03 DIAGNOSIS — Y9389 Activity, other specified: Secondary | ICD-10-CM | POA: Insufficient documentation

## 2015-07-03 DIAGNOSIS — W19XXXA Unspecified fall, initial encounter: Secondary | ICD-10-CM

## 2015-07-03 DIAGNOSIS — S52592A Other fractures of lower end of left radius, initial encounter for closed fracture: Secondary | ICD-10-CM | POA: Insufficient documentation

## 2015-07-03 DIAGNOSIS — Z79899 Other long term (current) drug therapy: Secondary | ICD-10-CM | POA: Insufficient documentation

## 2015-07-03 DIAGNOSIS — S8001XA Contusion of right knee, initial encounter: Secondary | ICD-10-CM | POA: Diagnosis not present

## 2015-07-03 NOTE — ED Notes (Signed)
Pt from home via ems after suffering unwitnessed fall in her bedroom/bathroom. EMS states no LOC, pt not sure. Pt has obvious deformity to left wrist and it is splinted. Pt also c/o pain to right knee and left shoulder. Pt has previous falls and hip fx that have left her left leg shortened.

## 2015-07-03 NOTE — ED Provider Notes (Addendum)
[facility] Emergency Department Provider Note  ____________________________________________  Time seen: Approximately 10:18 AM  I have reviewed the triage vital signs and the nursing notes.   HISTORY  Chief Complaint Fall    HPI [name] is a 81 y.o. female who reports she fell. She had told her daughter that she had gone to the bedside commode and was standing up reaching for a wiped clean her hands. She lost her balance and fell. She does not believe she passed out. Daughter does not believe he passed out either. Patient does not have a headache or any bumps on the head. Patient complains of pain in the left wrist pain and swelling in the left shoulder and pain in the right knee. Patient has had a previous femur fracture and surgery on that right knee to repair it. Patient also had a recent fall with pelvic fracture acetabular fracture. Patient is able move her hips well without any pain. Patient has a frequent history of frequent falls especially since she is at her stroke.   Past Medical History  Diagnosis Date  . Anxiety   . HTN (hypertension)   . Stroke   . Depression   . Aortic stenosis   . ASD (atrial septal defect)   . Osteoporosis, postmenopausal   . AVM (arteriovenous malformation)     pulmonary, s/p cautery  . Anxiety   . RA (rheumatoid arthritis)   . Chronic obstruct airways disease   . Bronchitis     Patient Active Problem List   Diagnosis Date Noted  . Pressure ulcer 04/28/2015  . Acetabular fracture 04/27/2015    Past Surgical History  Procedure Laterality Date  . Cholecystectomy    . Femur fracture surgery    . Total abdominal hysterectomy      Current Outpatient Rx  Name  Route  Sig  Dispense  Refill  . ALPRAZolam (XANAX) 0.25 MG tablet   Oral   Take 1 tablet (0.25 mg total) by mouth 2 (two) times daily as needed for anxiety.   30 tablet   0   . aspirin EC 81 MG EC tablet   Oral   Take 1 tablet (81 mg total)  by mouth daily.   100 tablet   0   . Calcium Carbonate-Vitamin D (CALCIUM-VITAMIN D) 500-200 MG-UNIT per tablet   Oral   Take 1 tablet by mouth daily.         . Cholecalciferol (VITAMIN D-3) 1000 UNITS CAPS   Oral   Take 1 capsule by mouth daily.         Marland Kitchen ipratropium-albuterol (DUONEB) 0.5-2.5 (3) MG/3ML SOLN   Nebulization   Take 3 mLs by nebulization 4 (four) times daily.   360 mL   5   . levothyroxine (SYNTHROID, LEVOTHROID) 25 MCG tablet   Oral   Take 25 mcg by mouth daily before breakfast.         . metoCLOPramide (REGLAN) 5 MG tablet   Oral   Take 5 mg by mouth 3 (three) times daily before meals.         . mirtazapine (REMERON) 30 MG tablet   Oral   Take 30 mg by mouth at bedtime.         . pantoprazole (PROTONIX) 40 MG tablet   Oral   Take 40 mg by mouth 2 (two) times daily.         . traMADol (ULTRAM) 50 MG tablet   Oral   Take  by mouth every 6 (six) hours as needed.         Marland Kitchen acetaminophen (TYLENOL) 325 MG tablet   Oral   Take 2 tablets (650 mg total) by mouth every 6 (six) hours as needed for mild pain (or Fever >/= 101).   100 tablet   0   . fluticasone (FLONASE) 50 MCG/ACT nasal spray   Each Nare   Place 2 sprays into both nostrils daily as needed. For allergies      0   . guaiFENesin (MUCINEX) 600 MG 12 hr tablet   Oral   Take 1 tablet (600 mg total) by mouth 2 (two) times daily.   60 tablet   5   . heparin 5000 UNIT/ML injection   Subcutaneous   Inject 1 mL (5,000 Units total) into the skin every 8 (eight) hours.   30 mL   1   . ondansetron (ZOFRAN) 4 MG tablet   Oral   Take 1 tablet (4 mg total) by mouth every 6 (six) hours as needed for nausea.   20 tablet   0   . oxyCODONE (OXY IR/ROXICODONE) 5 MG immediate release tablet   Oral   Take 1 tablet (5 mg total) by mouth every 4 (four) hours as needed for moderate pain.   30 tablet   0   . polyethylene glycol (MIRALAX / GLYCOLAX) packet   Oral   Take 17 g by mouth  daily as needed for mild constipation.   14 each   0     Allergies Review of patient's allergies indicates no known allergies.  Family History  Problem Relation Age of Onset  . CAD Mother   . Stroke Mother   . Lung cancer Father   . Rheum arthritis Other     Social History Social History  Substance Use Topics  . Smoking status: Never Smoker   . Smokeless tobacco: Never Used  . Alcohol Use: No    Review of Systems Constitutional: No fever/chills Eyes: No visual changes. ENT: No sore throat. Cardiovascular: Denies chest pain. Respiratory: Denies shortness of breath. Gastrointestinal: No abdominal pain.  No nausea, no vomiting.  No diarrhea.  No constipation. Genitourinary: Negative for dysuria. Musculoskeletal: Negative for back pain. Skin: Negative for rash. Neurological: Negative for headaches, new focal weakness or numbness.  10-point ROS otherwise negative.  ____________________________________________   PHYSICAL EXAM:  VITAL SIGNS: ED Triage Vitals  Enc Vitals Group     BP 07/03/15 0943 156/58 mmHg     Pulse Rate 07/03/15 0943 93     Resp --      Temp 07/03/15 0943 98 F (36.7 C)     Temp Source 07/03/15 0943 Oral     SpO2 07/03/15 0943 94 %     Weight 07/03/15 0943 105 lb (47.628 kg)     Height 07/03/15 0943 5\' 6"  (1.676 m)     Head Cir --      Peak Flow --      Pain Score 07/03/15 0945 8     Pain Loc --      Pain Edu? --      Excl. in GC? --     Constitutional: Alert and oriented. Well appearing and in no acute distress. Eyes: Conjunctivae are normal. PERRL. EOMI. Head: Atraumatic. Nose: No congestion/rhinnorhea. Mouth/Throat: Mucous membranes are moist.  Oropharynx non-erythematous. Neck: No stridor.  No cervical spine tenderness to palpation. Cardiovascular: Normal rate, regular rhythm. Grossly normal heart sounds.  Good peripheral  circulation. Chest: No rib tenderness or bruising Respiratory: Normal respiratory effort.  No retractions.  Lungs CTAB. Gastrointestinal: Soft and nontender. No distention. No abdominal bruits. No CVA tenderness. Musculoskeletal: Patient has no obvious deformity left wrist. Patient has bruising and swelling in the anterior part of the left shoulder. Patient's left elbow was normal. The rest the patient's left arm is normal too. Patient's right knee is bruised swollen and somewhat tender No joint effusions. Neurologic:  Normal speech and language. No gross focal neurologic deficits are appreciated. No gait instability. Skin:  Skin is warm, dry and intact. No rash noted. Psychiatric: Mood and affect are normal. Speech and behavior are normal.  ____________________________________________   LABS (all labs ordered are listed, but only abnormal results are displayed)  Labs Reviewed - No data to display ____________________________________________  EKG  EKG read and interpreted by me shows normal sinus rhythm at a rate of 95 frequent premature supraventricular contractions. Right bundle-branch block. No acute changes. ____________________________________________  RADIOLOGY  X-ray shows a minimally displaced fracture of the clavicle and a comminuted angulated wrist fracture. ____________________________________________   PROCEDURES Discussed with Dr. Hyacinth Meeker who recommends a sling for the shoulder and the splint for the wrist he will see her in the office tomorrow and see if the swelling will allow cast to be placed there is not much swelling going on right this minute since the x-ray is negative patient is able to move the knee although it has some pain in it I will not further impair her ability to be move I putting a knee immobilizer on the knee. We can further evaluate the knee tomorrow ____________________________________________   INITIAL IMPRESSION / ASSESSMENT AND PLAN / ED COURSE  Pertinent labs & imaging results that were available during my care of the patient were reviewed by me and  considered in my medical decision making (see chart for details).   ____________________________________________   FINAL CLINICAL IMPRESSION(S) / ED DIAGNOSES  Final diagnoses:  Fall, initial encounter  Clavicle fracture, left, closed, initial encounter  Fracture of wrist, left, closed, initial encounter  Right knee pain      Arnaldo Natal, MD 07/03/15 1220 Patient has tramadol at home which she will use for the pain.  Arnaldo Natal, MD 07/03/15 [phone]

## 2015-07-03 NOTE — ED Notes (Signed)
Patient transported to X-ray 

## 2015-07-03 NOTE — ED Notes (Signed)
Pt assisted with bedpan. NAD noted.

## 2015-07-03 NOTE — ED Notes (Addendum)
Pt from home via ems after suffering unwitnessed fall; EMS states no LOC (daughter agrees), but pt states she doesn't remember falling. Pt has obvious deformity to left wrist, positive cap refill in left hand. Pt alert & oriented with warm, dry skin. Pt unable to give pain number, but deduced through discussion. Pain  mostly upon movement.

## 2015-07-10 ENCOUNTER — Inpatient Hospital Stay
Admission: EM | Admit: 2015-07-10 | Discharge: 2015-07-11 | DRG: 190 | Disposition: A | Discharge status: Skilled Nursing Facility | Payer: Medicare Other | Attending: Internal Medicine | Admitting: Internal Medicine

## 2015-07-10 ENCOUNTER — Emergency Department: Payer: Medicare Other

## 2015-07-10 DIAGNOSIS — Z8673 Personal history of transient ischemic attack (TIA), and cerebral infarction without residual deficits: Secondary | ICD-10-CM | POA: Diagnosis not present

## 2015-07-10 DIAGNOSIS — D649 Anemia, unspecified: Secondary | ICD-10-CM | POA: Diagnosis present

## 2015-07-10 DIAGNOSIS — F329 Major depressive disorder, single episode, unspecified: Secondary | ICD-10-CM | POA: Diagnosis present

## 2015-07-10 DIAGNOSIS — J209 Acute bronchitis, unspecified: Secondary | ICD-10-CM | POA: Diagnosis present

## 2015-07-10 DIAGNOSIS — M069 Rheumatoid arthritis, unspecified: Secondary | ICD-10-CM | POA: Diagnosis present

## 2015-07-10 DIAGNOSIS — J440 Chronic obstructive pulmonary disease with acute lower respiratory infection: Secondary | ICD-10-CM | POA: Diagnosis not present

## 2015-07-10 DIAGNOSIS — I10 Essential (primary) hypertension: Secondary | ICD-10-CM | POA: Diagnosis present

## 2015-07-10 DIAGNOSIS — L89153 Pressure ulcer of sacral region, stage 3: Secondary | ICD-10-CM | POA: Diagnosis present

## 2015-07-10 DIAGNOSIS — R042 Hemoptysis: Secondary | ICD-10-CM

## 2015-07-10 DIAGNOSIS — Q211 Atrial septal defect: Secondary | ICD-10-CM | POA: Diagnosis not present

## 2015-07-10 DIAGNOSIS — W19XXXA Unspecified fall, initial encounter: Secondary | ICD-10-CM | POA: Diagnosis present

## 2015-07-10 DIAGNOSIS — R0902 Hypoxemia: Secondary | ICD-10-CM | POA: Diagnosis present

## 2015-07-10 DIAGNOSIS — M810 Age-related osteoporosis without current pathological fracture: Secondary | ICD-10-CM | POA: Diagnosis present

## 2015-07-10 DIAGNOSIS — L8993 Pressure ulcer of unspecified site, stage 3: Secondary | ICD-10-CM

## 2015-07-10 DIAGNOSIS — I350 Nonrheumatic aortic (valve) stenosis: Secondary | ICD-10-CM

## 2015-07-10 DIAGNOSIS — J441 Chronic obstructive pulmonary disease with (acute) exacerbation: Secondary | ICD-10-CM | POA: Diagnosis not present

## 2015-07-10 DIAGNOSIS — I4510 Unspecified right bundle-branch block: Secondary | ICD-10-CM | POA: Diagnosis present

## 2015-07-10 DIAGNOSIS — J449 Chronic obstructive pulmonary disease, unspecified: Secondary | ICD-10-CM

## 2015-07-10 DIAGNOSIS — R531 Weakness: Secondary | ICD-10-CM

## 2015-07-10 DIAGNOSIS — F32A Depression, unspecified: Secondary | ICD-10-CM

## 2015-07-10 DIAGNOSIS — E039 Hypothyroidism, unspecified: Secondary | ICD-10-CM

## 2015-07-10 HISTORY — DX: Hypothyroidism, unspecified: E03.9

## 2015-07-10 HISTORY — DX: Anemia, unspecified: D64.9

## 2015-07-10 HISTORY — DX: Cardiac murmur, unspecified: R01.1

## 2015-07-10 LAB — URINALYSIS COMPLETE WITH MICROSCOPIC (ARMC ONLY)
BACTERIA UA: NONE SEEN
BILIRUBIN URINE: NEGATIVE
Glucose, UA: NEGATIVE mg/dL
Ketones, ur: NEGATIVE mg/dL
LEUKOCYTES UA: NEGATIVE
Nitrite: NEGATIVE
PH: 5 (ref 5.0–8.0)
PROTEIN: NEGATIVE mg/dL
SQUAMOUS EPITHELIAL / LPF: NONE SEEN
Specific Gravity, Urine: 1.019 (ref 1.005–1.030)

## 2015-07-10 LAB — CBC WITH DIFFERENTIAL/PLATELET
BASOS PCT: 1 %
Basophils Absolute: 0.1 10*3/uL (ref 0–0.1)
EOS PCT: 23 %
Eosinophils Absolute: 2.3 10*3/uL — ABNORMAL HIGH (ref 0–0.7)
HEMATOCRIT: 29 % — AB (ref 35.0–47.0)
HEMOGLOBIN: 9.5 g/dL — AB (ref 12.0–16.0)
LYMPHS PCT: 11 %
Lymphs Abs: 1.1 10*3/uL (ref 1.0–3.6)
MCH: 33 pg (ref 26.0–34.0)
MCHC: 32.6 g/dL (ref 32.0–36.0)
MCV: 101 fL — AB (ref 80.0–100.0)
MONOS PCT: 4 %
Monocytes Absolute: 0.4 10*3/uL (ref 0.2–0.9)
NEUTROS ABS: 6.2 10*3/uL (ref 1.4–6.5)
NEUTROS PCT: 61 %
Platelets: 193 10*3/uL (ref 150–440)
RBC: 2.87 MIL/uL — ABNORMAL LOW (ref 3.80–5.20)
RDW: 14.6 % — AB (ref 11.5–14.5)
WBC: 10.1 10*3/uL (ref 3.6–11.0)

## 2015-07-10 LAB — COMPREHENSIVE METABOLIC PANEL
ALBUMIN: 3 g/dL — AB (ref 3.5–5.0)
ALK PHOS: 104 U/L (ref 38–126)
ALT: 9 U/L — ABNORMAL LOW (ref 14–54)
ANION GAP: 7 (ref 5–15)
AST: 20 U/L (ref 15–41)
BILIRUBIN TOTAL: 0.5 mg/dL (ref 0.3–1.2)
BUN: 18 mg/dL (ref 6–20)
CO2: 26 mmol/L (ref 22–32)
Calcium: 8.7 mg/dL — ABNORMAL LOW (ref 8.9–10.3)
Chloride: 108 mmol/L (ref 101–111)
Creatinine, Ser: 0.94 mg/dL (ref 0.44–1.00)
GFR, EST NON AFRICAN AMERICAN: 54 mL/min — AB (ref >60)
GLUCOSE: 158 mg/dL — AB (ref 65–99)
POTASSIUM: 4 mmol/L (ref 3.5–5.1)
Sodium: 141 mmol/L (ref 135–145)
TOTAL PROTEIN: 6.6 g/dL (ref 6.5–8.1)

## 2015-07-10 LAB — CBC
HCT: 28.7 % — ABNORMAL LOW (ref 35.0–47.0)
Hemoglobin: 9.6 g/dL — ABNORMAL LOW (ref 12.0–16.0)
MCH: 33.9 pg (ref 26.0–34.0)
MCHC: 33.3 g/dL (ref 32.0–36.0)
MCV: 101.8 fL — AB (ref 80.0–100.0)
PLATELETS: 196 10*3/uL (ref 150–440)
RBC: 2.82 MIL/uL — ABNORMAL LOW (ref 3.80–5.20)
RDW: 14.5 % (ref 11.5–14.5)
WBC: 8.2 10*3/uL (ref 3.6–11.0)

## 2015-07-10 LAB — PROTIME-INR
INR: 1.04
PROTHROMBIN TIME: 13.8 s (ref 11.4–15.0)

## 2015-07-10 LAB — EXPECTORATED SPUTUM ASSESSMENT W REFEX TO RESP CULTURE

## 2015-07-10 LAB — TYPE AND SCREEN
ABO/RH(D): AB POS
Antibody Screen: NEGATIVE

## 2015-07-10 LAB — TROPONIN I

## 2015-07-10 LAB — BRAIN NATRIURETIC PEPTIDE: B Natriuretic Peptide: 233 pg/mL — ABNORMAL HIGH (ref 0.0–100.0)

## 2015-07-10 MED ORDER — PANTOPRAZOLE SODIUM 40 MG PO TBEC
40.0000 mg | DELAYED_RELEASE_TABLET | Freq: Two times a day (BID) | ORAL | Status: DC
  Administered 2015-07-10 – 2015-07-11 (×3): 40 mg via ORAL
  Filled 2015-07-10 (×3): qty 1

## 2015-07-10 MED ORDER — OXYCODONE HCL 5 MG PO TABS
5.0000 mg | ORAL_TABLET | ORAL | Status: DC | PRN
Start: 2015-07-10 — End: 2015-07-11

## 2015-07-10 MED ORDER — ALPRAZOLAM 0.25 MG PO TABS: 0.2500 mg | ORAL_TABLET | Freq: Two times a day (BID) | ORAL | Status: DC | PRN

## 2015-07-10 MED ORDER — SENNOSIDES-DOCUSATE SODIUM 8.6-50 MG PO TABS
2.0000 | ORAL_TABLET | Freq: Every day | ORAL | Status: DC
  Administered 2015-07-10 – 2015-07-11 (×2): 2 via ORAL
  Filled 2015-07-10 (×2): qty 2

## 2015-07-10 MED ORDER — METHYLPREDNISOLONE SODIUM SUCC 40 MG IJ SOLR
40.0000 mg | Freq: Four times a day (QID) | INTRAMUSCULAR | Status: DC
  Administered 2015-07-10 – 2015-07-11 (×4): 40 mg via INTRAVENOUS
  Filled 2015-07-10 (×4): qty 1

## 2015-07-10 MED ORDER — CALCIUM CARBONATE-VITAMIN D 500-200 MG-UNIT PO TABS
1.0000 | ORAL_TABLET | Freq: Every day | ORAL | Status: DC
Start: 2015-07-10 — End: 2015-07-11
  Administered 2015-07-10 – 2015-07-11 (×2): 1 via ORAL
  Filled 2015-07-10 (×2): qty 1

## 2015-07-10 MED ORDER — IPRATROPIUM-ALBUTEROL 0.5-2.5 (3) MG/3ML IN SOLN
3.0000 mL | Freq: Four times a day (QID) | RESPIRATORY_TRACT | Status: DC
  Filled 2015-07-10: qty 3

## 2015-07-10 MED ORDER — TRAMADOL HCL 50 MG PO TABS: 50.0000 mg | ORAL_TABLET | Freq: Four times a day (QID) | ORAL | Status: DC | PRN

## 2015-07-10 MED ORDER — MIRTAZAPINE 15 MG PO TABS
30.0000 mg | ORAL_TABLET | Freq: Every day | ORAL | Status: DC
  Administered 2015-07-10: 30 mg via ORAL
  Filled 2015-07-10: qty 2

## 2015-07-10 MED ORDER — VITAMIN D 1000 UNITS PO TABS
1000.0000 [IU] | ORAL_TABLET | Freq: Every day | ORAL | Status: DC
  Administered 2015-07-10 – 2015-07-11 (×2): 1000 [IU] via ORAL
  Filled 2015-07-10 (×2): qty 1

## 2015-07-10 MED ORDER — ASPIRIN EC 81 MG PO TBEC
81.0000 mg | DELAYED_RELEASE_TABLET | Freq: Every day | ORAL | Status: DC
  Administered 2015-07-10 – 2015-07-11 (×2): 81 mg via ORAL
  Filled 2015-07-10 (×2): qty 1

## 2015-07-10 MED ORDER — ONDANSETRON HCL 4 MG/2ML IJ SOLN: 4.0000 mg | Freq: Four times a day (QID) | INTRAMUSCULAR | Status: DC | PRN

## 2015-07-10 MED ORDER — POLYETHYLENE GLYCOL 3350 17 G PO PACK: 17.0000 g | PACK | Freq: Every day | ORAL | Status: DC | PRN

## 2015-07-10 MED ORDER — ONDANSETRON HCL 4 MG PO TABS: 4.0000 mg | ORAL_TABLET | Freq: Four times a day (QID) | ORAL | Status: DC | PRN

## 2015-07-10 MED ORDER — GUAIFENESIN ER 600 MG PO TB12
600.0000 mg | ORAL_TABLET | Freq: Two times a day (BID) | ORAL | Status: DC
  Administered 2015-07-10 – 2015-07-11 (×3): 600 mg via ORAL
  Filled 2015-07-10 (×3): qty 1

## 2015-07-10 MED ORDER — LEVOTHYROXINE SODIUM 50 MCG PO TABS
25.0000 ug | ORAL_TABLET | Freq: Every day | ORAL | Status: DC
  Administered 2015-07-10 – 2015-07-11 (×2): 25 ug via ORAL
  Filled 2015-07-10 (×2): qty 1

## 2015-07-10 MED ORDER — IPRATROPIUM-ALBUTEROL 0.5-2.5 (3) MG/3ML IN SOLN
3.0000 mL | Freq: Once | RESPIRATORY_TRACT | Status: AC
  Administered 2015-07-10: 3 mL via RESPIRATORY_TRACT
  Filled 2015-07-10: qty 3

## 2015-07-10 MED ORDER — ENSURE ENLIVE PO LIQD
237.0000 mL | ORAL | Status: DC
  Administered 2015-07-10: 15:00:00 237 mL via ORAL

## 2015-07-10 MED ORDER — IPRATROPIUM-ALBUTEROL 0.5-2.5 (3) MG/3ML IN SOLN
3.0000 mL | Freq: Four times a day (QID) | RESPIRATORY_TRACT | Status: DC
  Administered 2015-07-10 – 2015-07-11 (×6): 3 mL via RESPIRATORY_TRACT
  Filled 2015-07-10 (×6): qty 3

## 2015-07-10 MED ORDER — AZITHROMYCIN 500 MG IV SOLR
500.0000 mg | INTRAVENOUS | Status: DC
  Administered 2015-07-10 – 2015-07-11 (×2): 500 mg via INTRAVENOUS
  Filled 2015-07-10 (×2): qty 500

## 2015-07-10 MED ORDER — ASPIRIN EC 81 MG PO TBEC: 81.0000 mg | DELAYED_RELEASE_TABLET | Freq: Every day | ORAL | Status: DC

## 2015-07-10 MED ORDER — ACETAMINOPHEN 325 MG PO TABS: 650.0000 mg | ORAL_TABLET | Freq: Four times a day (QID) | ORAL | Status: DC | PRN

## 2015-07-10 MED ORDER — METHYLPREDNISOLONE SODIUM SUCC 125 MG IJ SOLR
80.0000 mg | Freq: Once | INTRAMUSCULAR | Status: AC
  Administered 2015-07-10: 80 mg via INTRAVENOUS
  Filled 2015-07-10: qty 2

## 2015-07-10 NOTE — Progress Notes (Signed)
Date: 07/10/2015,   MRN# [id number] [name] 20-Sep-1928 Code Status:     Code Status Orders        Start     Ordered   07/10/15 0544  Full code   Continuous     07/10/15 0543    Advance Directive Documentation        Most Recent Value   Type of Advance Directive  Healthcare Power of Attorney, Living will   Pre-existing out of facility DNR order (yellow form or pink MOST form)     "MOST" Form in Place?       Hosp day:@LENGTHOFSTAYDAYS @ Referring MD: @ATDPROV @     PCP:      AdmissionWeight: 105 lb (47.628 kg)                 CurrentWeight: 108 lb 6.4 oz (49.17 kg)  CC: mild hemoptysis  HPI: This is an 81 year old  97 whose daughter mentioned she was coughing and noted blood in sputum. She is known to have copd, her cxr is clear. No pleurisy, calf pain, swelling, sob or bleeding elsewhere. Her sputum is clear this pm.  PMHX:   Past Medical History  Diagnosis Date  . Anxiety   . HTN (hypertension)   . Stroke   . Depression   . Aortic stenosis   . ASD (atrial septal defect)   . Osteoporosis, postmenopausal   . AVM (arteriovenous malformation)     pulmonary, s/p cautery  . Anxiety   . RA (rheumatoid arthritis)   . Chronic obstruct airways disease   . Bronchitis   . Anemia   . Hypothyroidism   . Heart murmur    Surgical Hx:  Past Surgical History  Procedure Laterality Date  . Cholecystectomy    . Femur fracture surgery    . Total abdominal hysterectomy    . Fracture surgery     Family Hx:  Family History  Problem Relation Age of Onset  . CAD Mother   . Stroke Mother   . Lung cancer Father   . Rheum arthritis Other    Social Hx:   Social History  Substance Use Topics  . Smoking status: Never Smoker   . Smokeless tobacco: Never Used  . Alcohol Use: No   Medication:    Home Medication:  No current outpatient prescriptions on file.  Current Medication: @CURMEDTAB @   Allergies:  Review of patient's allergies indicates no known  allergies.  Review of Systems: Gen:  Denies  fever, sweats, chills HEENT: Denies blurred vision, double vision, ear pain, eye pain, hearing loss, nose bleeds, sore throat Cvc:  No dizziness, chest pain or heaviness Resp:  No sob, no more hemoptysis  Gi: Denies swallowing difficulty, stomach pain, nausea or vomiting, diarrhea, constipation, bowel incontinence Gu:  Denies bladder incontinence, burning urine Ext:   No Joint pain, stiffness or swelling Skin: No skin rash, easy bruising or bleeding or hives Endoc:  No polyuria, polydipsia , polyphagia or weight change Psych: No depression, insomnia or hallucinations  Other:  All other systems negative  Physical Examination:   VS: BP 128/71 mmHg  Pulse 101  Temp(Src) 97.9 F (36.6 C) (Oral)  Resp 18  Ht 5\' 1"  (1.549 m)  Wt 108 lb 6.4 oz (49.17 kg)  BMI 20.49 kg/m2  SpO2 92%  General Appearance: No distress, eating, small frame  NeurIMPRESSION: 1. No evidence of a pulmonary embolus. 2. Mucous plugging occluding lower lobe bronchi bilaterally and extending into segmental bronchi  of both lower lobes and the left upper lobe lingula. 3. Subsegmental atelectasis of the lower lobes, left greater than right. No convincing pneumonia or pulmonary edema. 4. Pleural parenchymal scarring at the lung apices, presumed chronic. Small to moderate hiatal hernia.   Electronically Signed  By: Amie Portland M.D.  On: 04/29/2015 18:16sycho: without focal findings, mental status, speech normal, alert and oriented, cranial nerves 2-12 intact, reflexes normal and symmetric, sensation grossly normal  HEENT: PERRLA, EOM intact, no ptosis, no other lesions noticed, Mallampati: Pulmonary:.No wheezing, No rales  Sputum Production:   Cardiovascular:  Normal S1,S2.  No m/r/g.  Abdominal aorta pulsation normal.    Abdomen:Benign, Soft, non-tender, No masses, hepatosplenomegaly, No lymphadenopathy Endoc: No evident thyromegaly, no signs of acromegaly or  Cushing features Skin:   warm, no rashes, no ecchymosis  Extremities: normal, no cyanosis, clubbing, no edema, warm with normal capillary refill. Other findings:   Labs results:   Recent Labs     07/10/15  0146  07/10/15  0627  HGB  9.5*  9.6*  HCT  29.0*  28.7*  MCV  101.0*  101.8*  WBC  10.1  8.2  BUN  18   --   CREATININE  0.94   --   GLUCOSE  158*   --   CALCIUM  8.7*   --   INR  1.04   --   ,    Rad results:   Dg Chest 2 View  07/10/2015   CLINICAL DATA:  Cough. Patient reports coughing up blood for 1 hour. Wheezing.  EXAM: CHEST  2 VIEW  COMPARISON:  Radiograph 07/03/2015.  Chest CT 04/29/2015  FINDINGS: The cardiomediastinal contours are normal. There is atherosclerosis of the aortic arch. Pulmonary vasculature is normal. No consolidation, pleural effusion, or pneumothorax. No acute osseous abnormalities are seen. The bones are under mineralized. An IVC filter is noted in the included upper abdomen.  IMPRESSION: No acute pulmonary process.  No change from prior exam.   Electronically Signed   By: Rubye Oaks M.D.   On: 07/10/2015 01:58   IMPRESSION:chest ct scan 1. No evidence of a pulmonary embolus. 2. Mucous plugging occluding lower lobe bronchi bilaterally and extending into segmental bronchi of both lower lobes and the left upper lobe lingula. 3. Subsegmental atelectasis of the lower lobes, left greater than right. No convincing pneumonia or pulmonary edema. 4. Pleural parenchymal scarring at the lung apices, presumed chronic. Small to moderate hiatal hernia.  Electronically Signed  By: Amie Portland M.D.  On: 04/29/2015 18:16     Assessment and Plan: NON massive hemoptysis, clear chest xray, now resolved, most likely due to bronchitis  Plan; antibiotics, steroids,    I have personally obtained a history, examined the patient, evaluated laboratory and imaging results, formulated the assessment and plan and placed orders.  The Patient requires  high complexity decision making for assessment and support, frequent evaluation and titration of therapies, application of advanced monitoring technologies and extensive interpretation of multiple databases.   Para Cossey,M.D. Pulmonary & Critical care Medicine [facility]

## 2015-07-10 NOTE — ED Notes (Signed)
Per patient's family - patient was coughing around midnight and called daughter's name. Daughter came into room and saw some blood on bed and floor. Daughter states blood was bright red and smelled. States patient "belched" up the blood.

## 2015-07-10 NOTE — Plan of Care (Signed)
Problem: Discharge Progression Outcomes Goal: Other Discharge Outcomes/Goals Outcome: Progressing Pt worked with PT, they recommend a rehab facility, possibly [facility] for rehab.

## 2015-07-10 NOTE — Clinical Social Work Placement (Signed)
   CLINICAL SOCIAL WORK PLACEMENT  NOTE  Date:  07/10/2015  Patient Details  Name: [name] MRN: [id number] Date of Birth: 1928-03-10  Clinical Social Work is seeking post-discharge placement for this patient at the Skilled  Nursing Facility level of care (*CSW will initial, date and re-position this form in  chart as items are completed):  Yes   Patient/family provided with [facility] Clinical Social Work Department's list of facilities offering this level of care within the geographic area requested by the patient (or if unable, by the patient's family).  Yes   Patient/family informed of their freedom to choose among providers that offer the needed level of care, that participate in Medicare, Medicaid or managed care program needed by the patient, have an available bed and are willing to accept the patient.  Yes   Patient/family informed of [facility]'s ownership interest in [facility] and [facility], as well as of the fact that they are under no obligation to receive care at these facilities.  PASRR submitted to EDS on       PASRR number received on       Existing PASRR number confirmed on 07/10/15     FL2 transmitted to all facilities in geographic area requested by pt/family on 07/10/15     FL2 transmitted to all facilities within larger geographic area on       Patient informed that his/her managed care company has contracts with or will negotiate with certain facilities, including the following:            Patient/family informed of bed offers received.  Patient chooses bed at       Physician recommends and patient chooses bed at      Patient to be transferred to   on  .  Patient to be transferred to facility by       Patient family notified on   of transfer.  Name of family member notified:        PHYSICIAN Please sign FL2     Additional Comment:    _______________________________________________ Haig Prophet, LCSW 07/10/2015, 1:45  PM

## 2015-07-10 NOTE — Evaluation (Signed)
Physical Therapy Evaluation Patient Details Name: [name] MRN: [id number] DOB: 1928/01/05 Today's Date: 07/10/2015   History of Present Illness  Pt is an 81 y.o. female presenting to hospital with hemoptysis x1, cough, SOB, and wheezing; pt admitted with COPD exacerbation.  Pt was in hospital in July s/p mechanical fall 04/27/15 (R slightly displaced acute R acetabular fx non-op); pt left hospital to STR and was TDWB R LE (pt reports recent MD visit with Dr. Martha Clan and is PWB R LE now).  Pt with recent mechanical fall 07/03/15 at home sustaining comminuted impacted fx's of L distal radius and ulna (L wrist/forearm in hard cast) and also mildly displaced distal L clavicular fx (Ortho MD Hyacinth Meeker following per pt).  PMH also includes chronic anemia, htn, aortic stenosis, COPD, RA, atrial septal defect, AVM s/p pulm cath, stroke, femur fx surgery (ORIF R LE), TAH.  Clinical Impression  Currently pt demonstrates impairments with strength, balance, and limitations with functional mobility (complicated d/t PWB'ing R LE from injury from fall in July and NWB'ing L UE from injury from fall last week Thursday--see HPI above for details).  Prior to fall last week pt was ambulating with RW short distance to bathroom on her own but had family assist for longer distances (either using RW or being pushed in w/c).  Pt lives with her daughter and son-in-law and her family has been providing 24/7 assist since fall last week (but pt's cousin reports difficulties with amount of assist pt requires for transfers since fall).  Currently pt is min assist supine to sit and mod assist stand pivot transfer bed to recliner; d/t current injuries ambulation does not appear appropriate at this time.  Pt would benefit from skilled PT to address above noted impairments and functional limitations.  Recommend pt discharge to STR to improve independence with bed mobility, transfers, and w/c mobility safety when medically  appropriate.     Follow Up Recommendations SNF    Equipment Recommendations       Recommendations for Other Services       Precautions / Restrictions Precautions Precautions: Fall Required Braces or Orthoses: Other Brace/Splint Other Brace/Splint: L wrist/forearm hard cast Restrictions Weight Bearing Restrictions: Yes LUE Weight Bearing: Non weight bearing RLE Weight Bearing: Partial weight bearing RLE Partial Weight Bearing Percentage or Pounds: per pt PWB R LE      Mobility  Bed Mobility Overal bed mobility: Needs Assistance Bed Mobility: Supine to Sit;Rolling Rolling: Mod assist (logrolling to R to get on/off bed pain)   Supine to sit: Min assist;HOB elevated     General bed mobility comments: extra time to perform d/t NWB'ing L UE; vc's required for technique (R UE and L LE positioning to assist)  Transfers Overall transfer level: Needs assistance Equipment used: None Transfers: Stand Pivot Transfers   Stand pivot transfers: Mod assist       General transfer comment: vc's required for hand and feet positioning for transfer  Ambulation/Gait             General Gait Details: Deferred d/t NWB L UE (with L clavicular fx) and PWB'ing R LE (difficult to maintain WB'ing status with gait d/t impairments)  Stairs            Wheelchair Mobility    Modified Rankin (Stroke Patients Only)       Balance Overall balance assessment: Needs assistance Sitting-balance support: No upper extremity supported;Feet supported Sitting balance-Leahy Scale: Good  Pertinent Vitals/Pain Pain Assessment: No/denies pain  Vitals stable and WFL throughout treatment session (see flowsheet for details of HR and O2).    Home Living Family/patient expects to be discharged to:: Private residence Living Arrangements: Children (Daughter and son-in-law's home) Available Help at Discharge: Family Type of Home:  House Home Access: Stairs to enter Entrance Stairs-Rails: Left Entrance Stairs-Number of Steps: 2 steps Home Layout: One level Home Equipment: Environmental consultant - 2 wheels;Bedside commode;Wheelchair - manual;Hospital bed Additional Comments: family has been providing 24/7 care since fall last week but has been having some difficulties with amount of assist pt requires for transfers    Prior Function Level of Independence: Needs assistance   Gait / Transfers Assistance Needed: SBA using RW for longer distances in home and in community (was independent shorter distances with RW to bathroom); pt had someone push her in manual w/c for longer distances in home and community  ADL's / Homemaking Assistance Needed: sponge baths; assist for meals; cleaning; laundry        Hand Dominance        Extremity/Trunk Assessment   Upper Extremity Assessment: RUE deficits/detail;LUE deficits/detail RUE Deficits / Details: ROM and strength WFL         Lower Extremity Assessment: RLE deficits/detail RLE Deficits / Details: R hip flexion, knee flexion/extension at least 3/5 (deferred MMT d/t h/o healing fx); R DF at least 4/5 LLE Deficits / Details: ROM and strength WFL     Communication   Communication: HOH  Cognition Arousal/Alertness: Awake/alert Behavior During Therapy: WFL for tasks assessed/performed Overall Cognitive Status: Within Functional Limits for tasks assessed                      General Comments General comments (skin integrity, edema, etc.): L wrist/forearm hard cast in place  Nursing cleared pt for participation in physical therapy.  Pt agreeable to PT session.  Pt's cousin present during session.    Exercises        Assessment/Plan    PT Assessment Patient needs continued PT services  PT Diagnosis Difficulty walking   PT Problem List Decreased strength;Decreased activity tolerance;Decreased balance;Decreased mobility;Decreased knowledge of use of DME  PT Treatment  Interventions DME instruction;Gait training;Stair training;Functional mobility training;Therapeutic activities;Therapeutic exercise;Balance training;Patient/family education;Wheelchair mobility training   PT Goals (Current goals can be found in the Care Plan section) Acute Rehab PT Goals Patient Stated Goal: To improve transfers PT Goal Formulation: With patient/family Time For Goal Achievement: 07/24/15 Potential to Achieve Goals: Good    Frequency Min 2X/week   Barriers to discharge Decreased caregiver support Pt's cousin reports that it has been difficult performing transfers with pt at home    Co-evaluation               End of Session Equipment Utilized During Treatment: Gait belt (pt has R soft knee brace on that she likes to use for comfort) Activity Tolerance: Patient tolerated treatment well Patient left: in chair;with call bell/phone within reach;with chair alarm set;with family/visitor present Nurse Communication: Mobility status;Weight bearing status;Precautions         Time: 1610-9604 PT Time Calculation (min) (ACUTE ONLY): 51 min   Charges:   PT Evaluation $Initial PT Evaluation Tier I: 1 Procedure PT Treatments $Therapeutic Activity: 8-22 mins   PT G CodesHendricks Limes 07/12/2015, 3:39 PM Hendricks Limes, PT [phone]

## 2015-07-10 NOTE — H&P (Signed)
[facility] [place] [facility]   PATIENT NAME: [name]    MR#:  [id number]  DATE OF BIRTH:  [birth year]  DATE OF ADMISSION:  07/10/2015  PRIMARY CARE PHYSICIAN: Marguarite Arbour, MD   REQUESTING/REFERRING PHYSICIAN: Dr. Dolores Frame  CHIEF COMPLAINT:   Chief Complaint  Patient presents with  . Cough  . Hemoptysis    HISTORY OF PRESENT ILLNESS:  [name]  is a 81 y.o. female with a known history of COPD, aortic stenosis, atrial septal defect, AVM status post pulmonary cauterization in the remote past, chronic anemia, depression, hypothyroidism presents to the emergency room with the complaints of cough with associated hemoptysis 1 around midnight. According to the patient's daughter she heard patient coughing and when she went to her room found small amount of blood coughing up hence brought to the emergency room for further evaluation. Associated shortness of breath with wheezing +. Has been noticing some increased cough with some sputum for the past few days. Denies any fever, chest pain, dizziness, nausea, vomiting, diarrhea, abdominal pain, dysuria. Also mentions she has been having some increased generalized weakness for which she has seen her primary care practitioner Dr. Judithann Sheen on Monday and home help arrangement is in process. She did have a fall recently secondary to tripping and sustained fracture to left wrist and left collarbone. In the emergency room patient was evaluated by the ED physician, found to be with stable vital signs, was mildly hypoxic with room air O2 saturations around 92-94%. Workup revealed H&H of 9.5/29.0, WBC 10.1. Chest x-ray negative for any acute cardiopulmonary pathology. EKG sinus tachycardia with ventricular rate of 10 3 bpm, right bundle branch block, no acute ischemic changes. Of note most recent H&H was 9.8/30.6 on 07/07/2015. Patient was given vigorous DuoNeb's, IV Solu-Medrol and hospitalist service was consulted for  further management. Patient at the current time is comfortably resting in the bed, denies any complaints. No further episodes of hematemesis since the initial episode.  PAST MEDICAL HISTORY:   Past Medical History  Diagnosis Date  . Anxiety   . HTN (hypertension)   . Stroke   . Depression   . Aortic stenosis   . ASD (atrial septal defect)   . Osteoporosis, postmenopausal   . AVM (arteriovenous malformation)     pulmonary, s/p cautery  . Anxiety   . RA (rheumatoid arthritis)   . Chronic obstruct airways disease   . Bronchitis   . Anemia   . Hypothyroidism   . Heart murmur     PAST SURGICAL HISTORY:   Past Surgical History  Procedure Laterality Date  . Cholecystectomy    . Femur fracture surgery    . Total abdominal hysterectomy    . Fracture surgery      SOCIAL HISTORY:   Social History  Substance Use Topics  . Smoking status: Never Smoker   . Smokeless tobacco: Never Used  . Alcohol Use: No    FAMILY HISTORY:   Family History  Problem Relation Age of Onset  . CAD Mother   . Stroke Mother   . Lung cancer Father   . Rheum arthritis Other     DRUG ALLERGIES:  No Known Allergies  REVIEW OF SYSTEMS:   Review of Systems  Constitutional: Positive for malaise/fatigue. Negative for fever and chills.  HENT: Negative for ear pain, hearing loss, nosebleeds, sore throat and tinnitus.   Eyes: Negative for blurred vision, double vision, pain, discharge and redness.  Respiratory: Positive for cough,  hemoptysis, sputum production, shortness of breath and wheezing.   Cardiovascular: Negative for chest pain, palpitations, orthopnea and leg swelling.  Gastrointestinal: Negative for nausea, vomiting, abdominal pain, diarrhea, constipation, blood in stool and melena.  Genitourinary: Negative for dysuria, urgency, frequency and hematuria.  Musculoskeletal: Negative for back pain, joint pain and neck pain.  Skin: Negative for itching and rash.  Neurological: Negative for  dizziness, tingling, sensory change, focal weakness and seizures.       Generalized weakness +  Endo/Heme/Allergies: Does not bruise/bleed easily.  Psychiatric/Behavioral: Negative for depression. The patient is nervous/anxious.     MEDICATIONS AT HOME:   Prior to Admission medications   Medication Sig Start Date End Date Taking? Authorizing Provider  acetaminophen (TYLENOL) 325 MG tablet Take 2 tablets (650 mg total) by mouth every 6 (six) hours as needed for mild pain (or Fever >/= 101). Patient taking differently: Take 500 mg by mouth every 6 (six) hours as needed for mild pain (or Fever >/= 101).  04/29/15  Yes Marguarite Arbour, MD  ALPRAZolam Prudy Feeler) 0.25 MG tablet Take 1 tablet (0.25 mg total) by mouth 2 (two) times daily as needed for anxiety. Patient taking differently: Take 0.25 mg by mouth 2 (two) times daily as needed for anxiety or sleep.  04/29/15  Yes Marguarite Arbour, MD  aspirin EC 81 MG EC tablet Take 1 tablet (81 mg total) by mouth daily. 04/29/15  Yes Marguarite Arbour, MD  Calcium Carbonate-Vitamin D (CALCIUM-VITAMIN D) 500-200 MG-UNIT per tablet Take 1 tablet by mouth daily.   Yes Historical Provider, MD  Cholecalciferol (VITAMIN D-3) 1000 UNITS CAPS Take 1 capsule by mouth daily.   Yes Historical Provider, MD  ipratropium-albuterol (DUONEB) 0.5-2.5 (3) MG/3ML SOLN Take 3 mLs by nebulization 4 (four) times daily. Patient taking differently: Take 3 mLs by nebulization 2 (two) times daily.  04/29/15  Yes Marguarite Arbour, MD  levothyroxine (SYNTHROID, LEVOTHROID) 25 MCG tablet Take 25 mcg by mouth daily before breakfast.   Yes Historical Provider, MD  metoCLOPramide (REGLAN) 5 MG tablet Take 5 mg by mouth 3 (three) times daily before meals.   Yes Historical Provider, MD  mirtazapine (REMERON) 30 MG tablet Take 30 mg by mouth at bedtime.   Yes Historical Provider, MD  pantoprazole (PROTONIX) 40 MG tablet Take 40 mg by mouth 2 (two) times daily.   Yes Historical Provider, MD   senna-docusate (SENOKOT-S) 8.6-50 MG per tablet Take 2 tablets by mouth daily.   Yes Historical Provider, MD  traMADol (ULTRAM) 50 MG tablet Take by mouth every 6 (six) hours as needed for moderate pain.    Yes Historical Provider, MD  guaiFENesin (MUCINEX) 600 MG 12 hr tablet Take 1 tablet (600 mg total) by mouth 2 (two) times daily. Patient not taking: Reported on 07/10/2015 04/29/15   Marguarite Arbour, MD  heparin 5000 UNIT/ML injection Inject 1 mL (5,000 Units total) into the skin every 8 (eight) hours. Patient not taking: Reported on 07/10/2015 04/29/15   Marguarite Arbour, MD  ondansetron (ZOFRAN) 4 MG tablet Take 1 tablet (4 mg total) by mouth every 6 (six) hours as needed for nausea. Patient not taking: Reported on 07/10/2015 04/29/15   Marguarite Arbour, MD  oxyCODONE (OXY IR/ROXICODONE) 5 MG immediate release tablet Take 1 tablet (5 mg total) by mouth every 4 (four) hours as needed for moderate pain. Patient not taking: Reported on 07/10/2015 04/29/15   Marguarite Arbour, MD  polyethylene glycol [facility] / Ethelene Hal)  packet Take 17 g by mouth daily as needed for mild constipation. Patient not taking: Reported on 07/10/2015 04/29/15   Marguarite Arbour, MD      VITAL SIGNS:  Blood pressure 109/60, pulse 107, temperature 98.2 F (36.8 C), temperature source Oral, resp. rate 18, height 5\' 6"  (1.676 m), weight 50.349 kg (111 lb), SpO2 94 %.  PHYSICAL EXAMINATION:  Physical Exam  Constitutional: She is oriented to person, place, and time. She appears well-developed and well-nourished. No distress.  HENT:  Head: Normocephalic and atraumatic.  Right Ear: External ear normal.  Left Ear: External ear normal.  Nose: Nose normal.  Mouth/Throat: Oropharynx is clear and moist. No oropharyngeal exudate.  Eyes: EOM are normal. Pupils are equal, round, and reactive to light. No scleral icterus.  Neck: Normal range of motion. Neck supple. No JVD present. No thyromegaly present.  Cardiovascular: Normal rate,  regular rhythm and intact distal pulses.  Exam reveals no friction rub.   Murmur (Systolic murmur +) heard. Respiratory: Effort normal. No respiratory distress. She has wheezes. She has no rales. She exhibits no tenderness.  GI: Soft. Bowel sounds are normal. She exhibits no distension and no mass. There is no tenderness. There is no rebound and no guarding.  Musculoskeletal: Normal range of motion. She exhibits no edema.  Left wrist and forearm in hard cast  Lymphadenopathy:    She has no cervical adenopathy.  Neurological: She is alert and oriented to person, place, and time. She has normal reflexes. She displays normal reflexes. No cranial nerve deficit. She exhibits normal muscle tone.  Skin: Skin is warm. No rash noted. No erythema.  Bruise present over left shoulder  Psychiatric: She has a normal mood and affect. Her behavior is normal. Thought content normal.   LABORATORY PANEL:   CBC  Recent Labs Lab 07/10/15 0146  WBC 10.1  HGB 9.5*  HCT 29.0*  PLT 193   ------------------------------------------------------------------------------------------------------------------  Chemistries   Recent Labs Lab 07/10/15 0146  NA 141  K 4.0  CL 108  CO2 26  GLUCOSE 158*  BUN 18  CREATININE 0.94  CALCIUM 8.7*  AST 20  ALT 9*  ALKPHOS 104  BILITOT 0.5   ------------------------------------------------------------------------------------------------------------------  Cardiac Enzymes  Recent Labs Lab 07/10/15 0146  TROPONINI <0.03   ------------------------------------------------------------------------------------------------------------------  RADIOLOGY:  Dg Chest 2 View  07/10/2015   CLINICAL DATA:  Cough. Patient reports coughing up blood for 1 hour. Wheezing.  EXAM: CHEST  2 VIEW  COMPARISON:  Radiograph 07/03/2015.  Chest CT 04/29/2015  FINDINGS: The cardiomediastinal contours are normal. There is atherosclerosis of the aortic arch. Pulmonary vasculature is  normal. No consolidation, pleural effusion, or pneumothorax. No acute osseous abnormalities are seen. The bones are under mineralized. An IVC filter is noted in the included upper abdomen.  IMPRESSION: No acute pulmonary process.  No change from prior exam.   Electronically Signed   By: 06/30/2015 M.D.   On: 07/10/2015 01:58    EKG:   Orders placed or performed during the hospital encounter of 07/10/15  . EKG 12-Lead  . EKG 12-Lead  Sinus tachycardia with ventricular rate of 10 3 bpm, right bundle branch block.  IMPRESSION AND PLAN:   81 year old Caucasian female with history of COPD, active stenosis, atrial septal defect, AVM status post pulmonary cautery in the remote past, chronic anemia was brought in with the complaints of cough with hemoptysis with associated shortness of breath and wheezing. 1. COPD exacerbation, likely secondary to 2 acute bronchitis.  2. Hemoptysis, likely secondary to cough. History of AVM status post pulmonary cautery  in the remote past. History of aortic stenosis and atrial septal defect. Patient hemodynamically stable. Plan: Admit, continue O2 supplementation, monitor O2 sats, IV Solu-Medrol, vigorous DuoNeb's, IV azithromycin, monitor H&H closely, type and screen. Consider further workup including pulmonary consultation as needed. 3. Anemia, chronic. Patient hemodynamically stable. Monitor H&H closely, type and screen. 4. Generalized weakness with history of recent fall. Physical therapy consultation requested, 5. Aortic stenosis, stable. 6. Depression, stable on home medications. Continue same 7. Hypothyroidism, stable on home medications. Continue same.    All the records are reviewed and case discussed with ED provider. Management plans discussed with the patient, family and they are in agreement.  CODE STATUS: Full code  TOTAL TIME TAKING CARE OF THIS PATIENT: 50 minutes.    Crissie Figures M.D on 07/10/2015 at 4:39 AM  Between 7am to 6pm -  Pager - [phone]  After 6pm go to [email or web address] - password [email or web address] [facility]  [facility]  Office  [phone]  CC: Primary care physician; Marguarite Arbour, MD

## 2015-07-10 NOTE — Progress Notes (Signed)
Clinical Child psychotherapist (CSW) presented bed offers to patient and daughter [name]. They chose [facility]	[place]. Kim admissions coordinator at [facility] is aware of above. CSW will continue to follow and assist as needed.   Jetta Lout, LCSWA [phone]

## 2015-07-10 NOTE — Progress Notes (Signed)
[name] is a 81 y.o. female  COPD exacerbation   SUBJECTIVE:  Pt admitted with SOB and cough with mild hemoptysis. Labs and CXR stable. Stable on RA. Breathing better now. Still weak.  ______________________________________________________________________  ROS: Review of systems is unremarkable for any active cardiac,respiratory, GI, GU, hematologic, neurologic or psychiatric systems, 10 systems reviewed.  @CMEDLIST @  Past Medical History  Diagnosis Date  . Anxiety   . HTN (hypertension)   . Stroke   . Depression   . Aortic stenosis   . ASD (atrial septal defect)   . Osteoporosis, postmenopausal   . AVM (arteriovenous malformation)     pulmonary, s/p cautery  . Anxiety   . RA (rheumatoid arthritis)   . Chronic obstruct airways disease   . Bronchitis   . Anemia   . Hypothyroidism   . Heart murmur     Past Surgical History  Procedure Laterality Date  . Cholecystectomy    . Femur fracture surgery    . Total abdominal hysterectomy    . Fracture surgery      PHYSICAL EXAM:  BP 146/62 mmHg  Pulse 101  Temp(Src) 97.8 F (36.6 C) (Oral)  Resp 18  Ht 5\' 1"  (1.549 m)  Wt 49.17 kg (108 lb 6.4 oz)  BMI 20.49 kg/m2  SpO2 92%  Wt Readings from Last 3 Encounters:  07/10/15 49.17 kg (108 lb 6.4 oz)  07/03/15 47.628 kg (105 lb)  04/28/15 51.256 kg (113 lb)            Constitutional: NAD Neck: supple, no thyromegaly Respiratory: CTA, no rales or wheezes Cardiovascular: tachy, 3/6 systolic murmur, no gallop Abdomen: soft, good BS, nontender Extremities: no edema Neuro: alert and oriented, no focal motor or sensory deficits  ASSESSMENT/PLAN:  Labs and imaging studies were reviewed  Will conitnue IV ABX and IV steroids. Pulmonology consult today. PT eval. May require SNF. Repeat labs and CXR in Am.

## 2015-07-10 NOTE — Clinical Social Work Note (Signed)
Clinical Social Work Assessment  Patient Details  Name: [name] MRN: [id number] Date of Birth: 19-Jun-1928  Date of referral:  07/10/15               Reason for consult:  Facility Placement                Permission sought to share information with:  Chartered certified accountant granted to share information::  Yes, Verbal Permission Granted  Name::      [name]::   [name]   Relationship::     Contact Information:     Housing/Transportation Living arrangements for the past 2 months:  [name] of Information:  Patient, Power of [place], Adult Children Patient Interpreter Needed:  None Criminal Activity/Legal Involvement Pertinent to Current Situation/Hospitalization:  No - Comment as needed Significant Relationships:  Adult Children, Other Family Members [name] patient's cousin. ) Lives with:  Adult Children Do you feel safe going back to the place where you live?  Yes Need for family participation in patient care:  Yes (Comment)  Care giving concerns: Patient lives with her daughter [name] [phone] and son in low in [place].   Social Worker assessment / plan: Holiday representative (CSW) received SNF consult. PT is recommending SNF. CSW met with patient and her cousin [name] was at bedside. Patient was alert and oriented and sitting in the chair. CSW introduced self and explained role of CSW department. Patient reported that she lives with her daughter [name] and went to [facility] this summer for rehab. Patient is agreeable to going to rehab again. Patient's daughter [name] called the room phone during assessment. CSW spoke with daughter on the phone. Per daughter patient was at [facility] from 04/30/15 to 05/31/15. Daughter is agreeable to SNF search and prefers [facility] or [facility]. CSW explained that patient will be in her co-pay days. CSW also discussed long term care options with daughter.  Daughter became tearful and reported that patient has lived with her for 23 years. Daughter did state that patient may need long term care however it is hard to let go. Patient already has Medicaid per [facility] records. CSW explained to daughter that patient's Medicaid will need to be changed to long term care Medicaid. CSW also explained that [facility] usually does not have any long term care beds available. Daughter verbalized her understanding and reported that she prefers patient go to [facility] just for rehab.   FL2 complete and on chart.   Employment status:  Disabled (Comment on whether or not currently receiving Disability), Retired Nurse, adult, Medicaid In [place] PT Recommendations:  [place] / Referral to community resources:  [place]  Patient/Family's Response to care: Patient and daughter are agreeable to [facility].   Patient/Family's Understanding of and Emotional Response to Diagnosis, Current Treatment, and Prognosis: Patient was pleasant throughout assessment and thanked CSW for visit.   Emotional Assessment Appearance:  Appears stated age Attitude/Demeanor/Rapport:    Affect (typically observed):  Accepting, Adaptable, Pleasant Orientation:  Oriented to Self, Oriented to Place, Oriented to  Time, Oriented to Situation Alcohol / Substance use:  Not Applicable Psych involvement (Current and /or in the community):  No (Comment)  Discharge Needs  Concerns to be addressed:  Discharge Planning Concerns Readmission within the last 30 days:  No Current discharge risk:  Dependent with Mobility Barriers to Discharge:  Continued Medical Work up  Holly Freshwater, LCSW 07/10/2015, 1:46 PM

## 2015-07-10 NOTE — ED Notes (Signed)
Pt presents to ED by EMS from home with a c/o coughing up blood since around midnight. EMS report wet sounding cough present. Pt has a hx of the same and states it was a "lung issue" at that time. Pt currently alert and slightly anxious. No acute distress noted. Pt reports having difficulty breathing currently. Slight audible wheeze present with no increased work of breathing present at this time. Denies chest pain. Blood is said to be dark brown like chocolate mild.

## 2015-07-10 NOTE — Care Management (Signed)
Admitted to this facility with the diagnosis of COPD. Lives with daughter, Antonieta Pert and son-in-law, [phone]). States her family is at home with her 24/7. Discharged from [facility] to [facility] 04/29/15. States she was at [facility] for a month. States home health was arranged when she discharged from [facility], but doesn't remember name of agency. States "they don't come anymore." "I haven't beem walking since they left.". Rolling walker and wheelchair in the home. No home oxygen. Life Alert in the home. Last Seen Dr, Judithann Sheen last Tuesday. Larey Seat prior to last admission, broken left arm. States she was light headed and dizzy and fell prior to this admission. Good appetite.  SoluMedrol IV continues. Pulmonary consult. Hgb 9.6. Physical therapy evaluation pending. Gwenette Greet RN MSN Care Management [phone]

## 2015-07-10 NOTE — ED Notes (Signed)
Dr. Reddy at bedside 

## 2015-07-10 NOTE — Progress Notes (Signed)
Initial Nutrition Assessment   INTERVENTION:   Meals and Snacks: Cater to patient preferences; will send chicken nuggets on meal tray for lunch per pt preference Medical Food Supplement Therapy: will recommend Ensure Enlive daily, each supplement provides 350 kcal and 20 grams of protein, and Magic cup BID providing 290kcals and 9g protein   NUTRITION DIAGNOSIS:   Increased nutrient needs related to wound healing as evidenced by estimated needs.  GOAL:   Patient will meet greater than or equal to 90% of their needs  MONITOR:    (Energy Intake, Skin, Pulmonary Profile)  REASON FOR ASSESSMENT:    (Pressure Ulcer)    ASSESSMENT:   Pt admitted coughing blood with COPD exacerbation likely secondary to bronchitis per MD note. Pt also with stage II coccyx pressure ulcer. Pt unavailable this am on multiple attempts.  Past Medical History  Diagnosis Date  . Anxiety   . HTN (hypertension)   . Stroke   . Depression   . Aortic stenosis   . ASD (atrial septal defect)   . Osteoporosis, postmenopausal   . AVM (arteriovenous malformation)     pulmonary, s/p cautery  . Anxiety   . RA (rheumatoid arthritis)   . Chronic obstruct airways disease   . Bronchitis   . Anemia   . Hypothyroidism   . Heart murmur     Diet Order:  Diet Heart Room service appropriate?: Yes; Fluid consistency:: Thin    Current Nutrition: Unable to clarify po intake this am.    Food/Nutrition-Related History: Pt family member reports pt eats small meals throughout the day prefers chicken nuggets and mashed potatoes at most meals. Pt family member reports pt will drink Ensure but not consistently but does like [employer].    Medications: Remeron, solumedrol, Protonix, vitamin D, Senokot  Electrolyte/Renal Profile and Glucose Profile:   Recent Labs Lab 07/10/15 0146  NA 141  K 4.0  CL 108  CO2 26  BUN 18  CREATININE 0.94  CALCIUM 8.7*  GLUCOSE 158*   Protein Profile:  Recent Labs Lab  07/10/15 0146  ALBUMIN 3.0*    Gastrointestinal Profile: Last BM:  07/09/2015   Nutrition-Focused Physical Exam Findings:  Unable to complete Nutrition-Focused physical exam at this time.    Weight Change: Per [facility] weight loss of 4% in the past 2 months.   Skin:   (Stage II coccyx pressure ulcer)   Height:   Ht Readings from Last 1 Encounters:  07/10/15 5\' 1"  (1.549 m)    Weight:   Wt Readings from Last 1 Encounters:  07/10/15 108 lb 6.4 oz (49.17 kg)   Wt Readings from Last 10 Encounters:  07/10/15 108 lb 6.4 oz (49.17 kg)  07/03/15 105 lb (47.628 kg)  04/28/15 113 lb (51.256 kg)    BMI:  Body mass index is 20.49 kg/(m^2).  Estimated Nutritional Needs:   Kcal:  BEE: 877kcals, TEE: (IF 1.2-1.4)(AF 1.2) 1263-1474kcals  Protein:  54-64g protein (1.1-1.3g/kg)   Fluid:  1225-1425mL of fluid (25-9mL/kg)   EDUCATION NEEDS:   Education needs no appropriate at this time   MODERATE Care Level  31m, RD, LDN Pager [phone]

## 2015-07-10 NOTE — ED Provider Notes (Signed)
[facility] Emergency Department Provider Note  ____________________________________________  Time seen: Approximately 1:15 AM  I have reviewed the triage vital signs and the nursing notes.   HISTORY  Chief Complaint Cough and Hemoptysis    HPI [name] is a 81 y.o. female who presents to the ED via EMS from home with a chief complaint of coughing up blood since around midnight. Patient lives with her daughter and son-in-law; daughter heard patient coughing a loose, rattly cough. Went into check on patient and states patient was coughing up dark red blood tinged sputum. Symptoms associated with wheezing.Denies blood thinners other than aspirin. Daughter states patient has had increasing generalized weakness over the past 2 days and is unable to ambulate even with her walker due to weakness. Daughter states patient is now requiring heavy assistance at home. Went to see PCP early this week who ordered home health, but that has not yet been set up. Denies recent travel, fevers, chills, abdominal pain, vomiting, diarrhea. Patient denies chest pain.   Past Medical History  Diagnosis Date  . Anxiety   . HTN (hypertension)   . Stroke   . Depression   . Aortic stenosis   . ASD (atrial septal defect)   . Osteoporosis, postmenopausal   . AVM (arteriovenous malformation)     pulmonary, s/p cautery  . Anxiety   . RA (rheumatoid arthritis)   . Chronic obstruct airways disease   . Bronchitis     Patient Active Problem List   Diagnosis Date Noted  . Pressure ulcer 04/28/2015  . Acetabular fracture 04/27/2015    Past Surgical History  Procedure Laterality Date  . Cholecystectomy    . Femur fracture surgery    . Total abdominal hysterectomy      Current Outpatient Rx  Name  Route  Sig  Dispense  Refill  . acetaminophen (TYLENOL) 325 MG tablet   Oral   Take 2 tablets (650 mg total) by mouth every 6 (six) hours as needed for mild pain (or Fever >/=  101).   100 tablet   0   . ALPRAZolam (XANAX) 0.25 MG tablet   Oral   Take 1 tablet (0.25 mg total) by mouth 2 (two) times daily as needed for anxiety.   30 tablet   0   . aspirin EC 81 MG EC tablet   Oral   Take 1 tablet (81 mg total) by mouth daily.   100 tablet   0   . Calcium Carbonate-Vitamin D (CALCIUM-VITAMIN D) 500-200 MG-UNIT per tablet   Oral   Take 1 tablet by mouth daily.         . Cholecalciferol (VITAMIN D-3) 1000 UNITS CAPS   Oral   Take 1 capsule by mouth daily.         . fluticasone (FLONASE) 50 MCG/ACT nasal spray   Each Nare   Place 2 sprays into both nostrils daily as needed. For allergies      0   . guaiFENesin (MUCINEX) 600 MG 12 hr tablet   Oral   Take 1 tablet (600 mg total) by mouth 2 (two) times daily.   60 tablet   5   . heparin 5000 UNIT/ML injection   Subcutaneous   Inject 1 mL (5,000 Units total) into the skin every 8 (eight) hours.   30 mL   1   . ipratropium-albuterol (DUONEB) 0.5-2.5 (3) MG/3ML SOLN   Nebulization   Take 3 mLs by nebulization 4 (four) times  daily.   360 mL   5   . levothyroxine (SYNTHROID, LEVOTHROID) 25 MCG tablet   Oral   Take 25 mcg by mouth daily before breakfast.         . metoCLOPramide (REGLAN) 5 MG tablet   Oral   Take 5 mg by mouth 3 (three) times daily before meals.         . mirtazapine (REMERON) 30 MG tablet   Oral   Take 30 mg by mouth at bedtime.         . ondansetron (ZOFRAN) 4 MG tablet   Oral   Take 1 tablet (4 mg total) by mouth every 6 (six) hours as needed for nausea.   20 tablet   0   . oxyCODONE (OXY IR/ROXICODONE) 5 MG immediate release tablet   Oral   Take 1 tablet (5 mg total) by mouth every 4 (four) hours as needed for moderate pain.   30 tablet   0   . pantoprazole (PROTONIX) 40 MG tablet   Oral   Take 40 mg by mouth 2 (two) times daily.         . polyethylene glycol (MIRALAX / GLYCOLAX) packet   Oral   Take 17 g by mouth daily as needed for mild  constipation.   14 each   0   . traMADol (ULTRAM) 50 MG tablet   Oral   Take by mouth every 6 (six) hours as needed.           Allergies Review of patient's allergies indicates no known allergies.  Family History  Problem Relation Age of Onset  . CAD Mother   . Stroke Mother   . Lung cancer Father   . Rheum arthritis Other     Social History Social History  Substance Use Topics  . Smoking status: Never Smoker   . Smokeless tobacco: Never Used  . Alcohol Use: No    Review of Systems Constitutional: No fever/chills Eyes: No visual changes. ENT: No sore throat. Cardiovascular: Denies chest pain. Respiratory: Positive for cough productive of bloody sputum and shortness of breath. Gastrointestinal: No abdominal pain.  No nausea, no vomiting.  No diarrhea.  No constipation. Genitourinary: Negative for dysuria. Musculoskeletal: Negative for back pain. Skin: Negative for rash. Neurological: Negative for headaches, focal weakness or numbness.  10-point ROS otherwise negative.  ____________________________________________   PHYSICAL EXAM:  VITAL SIGNS: ED Triage Vitals  Enc Vitals Group     BP --      Pulse Rate 07/10/15 0112 103     Resp 07/10/15 0112 20     Temp 07/10/15 0112 98.3 F (36.8 C)     Temp Source 07/10/15 0112 Oral     SpO2 07/10/15 0112 91 %     Weight 07/10/15 0112 105 lb (47.628 kg)     Height 07/10/15 0112 5\' 8"  (1.727 m)     Head Cir --      Peak Flow --      Pain Score --      Pain Loc --      Pain Edu? --      Excl. in GC? --     Constitutional: Alert and oriented. Well appearing and in no acute distress. Eyes: Conjunctivae are normal. PERRL. EOMI. Head: Atraumatic. Nose: No congestion/rhinnorhea. Mouth/Throat: Mucous membranes are moist.  Oropharynx non-erythematous. Neck: No stridor.   Cardiovascular: Normal rate, regular rhythm. III/VI SEM.  Good peripheral circulation. Respiratory: Increased respiratory effort.  No  retractions. Lungs with scattered wheezing, rales and rhonchi. Gastrointestinal: Soft and nontender. No distention. No abdominal bruits. No CVA tenderness. Musculoskeletal: No lower extremity tenderness nor edema.  No joint effusions. Neurologic:  Normal speech and language. No gross focal neurologic deficits are appreciated.  Skin:  Skin is warm, dry and intact. No rash noted. Psychiatric: Mood and affect are normal. Speech and behavior are normal.  ____________________________________________   LABS (all labs ordered are listed, but only abnormal results are displayed)  Labs Reviewed  CBC WITH DIFFERENTIAL/PLATELET - Abnormal; Notable for the following:    RBC 2.87 (*)    Hemoglobin 9.5 (*)    HCT 29.0 (*)    MCV 101.0 (*)    RDW 14.6 (*)    Eosinophils Absolute 2.3 (*)    All other components within normal limits  COMPREHENSIVE METABOLIC PANEL - Abnormal; Notable for the following:    Glucose, Bld 158 (*)    Calcium 8.7 (*)    Albumin 3.0 (*)    ALT 9 (*)    GFR calc non Af Amer 54 (*)    All other components within normal limits  BRAIN NATRIURETIC PEPTIDE - Abnormal; Notable for the following:    B Natriuretic Peptide 233.0 (*)    All other components within normal limits  URINALYSIS COMPLETEWITH MICROSCOPIC ([facility] ONLY) - Abnormal; Notable for the following:    Color, Urine YELLOW (*)    APPearance CLEAR (*)    Hgb urine dipstick 2+ (*)    All other components within normal limits  CULTURE, BLOOD (ROUTINE X 2)  CULTURE, BLOOD (ROUTINE X 2)  TROPONIN I  PROTIME-INR   ____________________________________________  EKG  ED ECG REPORT I, Makhiya Coburn J, the attending physician, personally viewed and interpreted this ECG.   Date: 07/10/2015  EKG Time: 0118  Rate: 103  Rhythm: sinus tachycardia  Axis: Normal  Intervals:right bundle branch block  ST&T Change: Nonspecific  ____________________________________________  RADIOLOGY  Chest 2 view (viewed by me,  interpreted per Dr. Manus Gunning): No acute pulmonary process. No change from prior exam. ____________________________________________   PROCEDURES  Procedure(s) performed: None  Critical Care performed: No  ____________________________________________   INITIAL IMPRESSION / ASSESSMENT AND PLAN / ED COURSE  Pertinent labs & imaging results that were available during my care of the patient were reviewed by me and considered in my medical decision making (see chart for details).  81 year old female who presents with hemoptysis and generalized weakness, unable to ambulate at home. Room air sats 91%. Will place patient on nasal cannula oxygen, administer nebulizer treatment, obtain screening labs, chest x-ray and reassess.  ----------------------------------------- 2:37 AM on 07/10/2015 -----------------------------------------  Review of chart reveals patient had CTA chest in July 2016 which demonstrated following: 1. No evidence of a pulmonary embolus. 2. Mucous plugging occluding lower lobe bronchi bilaterally and extending into segmental bronchi of both lower lobes and the left upper lobe lingula. 3. Subsegmental atelectasis of the lower lobes, left greater than right. No convincing pneumonia or pulmonary edema. 4. Pleural parenchymal scarring at the lung apices, presumed chronic. Small to moderate hiatal hernia.  ----------------------------------------- 3:54 AM on 07/10/2015 -----------------------------------------  Patient's resting in no acute distress. Discussed with hospitalist to evaluate patient in the emergency department for admission. ____________________________________________   FINAL CLINICAL IMPRESSION(S) / ED DIAGNOSES  Final diagnoses:  Weakness generalized  Chronic obstructive pulmonary disease, unspecified COPD, unspecified chronic bronchitis type  Hemoptysis  Hypoxia      Irean Hong, MD 07/10/15 [phone]

## 2015-07-11 ENCOUNTER — Encounter
Admission: RE | Admit: 2015-07-11 | Discharge: 2015-07-11 | Disposition: A | Discharge status: Home / Self Care | Payer: Medicare Other | Source: Ambulatory Visit | Attending: Internal Medicine | Admitting: Internal Medicine

## 2015-07-11 LAB — CBC WITH DIFFERENTIAL/PLATELET
Basophils Absolute: 0 10*3/uL (ref 0–0.1)
Basophils Relative: 0 %
Eosinophils Absolute: 0 10*3/uL (ref 0–0.7)
Eosinophils Relative: 1 %
HEMATOCRIT: 28.3 % — AB (ref 35.0–47.0)
HEMOGLOBIN: 9.3 g/dL — AB (ref 12.0–16.0)
LYMPHS ABS: 1.3 10*3/uL (ref 1.0–3.6)
Lymphocytes Relative: 17 %
MCH: 32.9 pg (ref 26.0–34.0)
MCHC: 32.9 g/dL (ref 32.0–36.0)
MCV: 100.2 fL — AB (ref 80.0–100.0)
MONOS PCT: 2 %
Monocytes Absolute: 0.2 10*3/uL (ref 0.2–0.9)
NEUTROS ABS: 6.1 10*3/uL (ref 1.4–6.5)
NEUTROS PCT: 80 %
Platelets: 216 10*3/uL (ref 150–440)
RBC: 2.82 MIL/uL — ABNORMAL LOW (ref 3.80–5.20)
RDW: 14.6 % — ABNORMAL HIGH (ref 11.5–14.5)
WBC: 7.7 10*3/uL (ref 3.6–11.0)

## 2015-07-11 LAB — BASIC METABOLIC PANEL
Anion gap: 7 (ref 5–15)
BUN: 23 mg/dL — AB (ref 6–20)
CHLORIDE: 107 mmol/L (ref 101–111)
CO2: 29 mmol/L (ref 22–32)
CREATININE: 0.82 mg/dL (ref 0.44–1.00)
Calcium: 8.8 mg/dL — ABNORMAL LOW (ref 8.9–10.3)
GFR calc Af Amer: >60 mL/min (ref >60)
GFR calc non Af Amer: >60 mL/min (ref >60)
Glucose, Bld: 178 mg/dL — ABNORMAL HIGH (ref 65–99)
Potassium: 3.7 mmol/L (ref 3.5–5.1)
Sodium: 143 mmol/L (ref 135–145)

## 2015-07-11 MED ORDER — OXYCODONE HCL 5 MG PO TABS: 5.0000 mg | ORAL_TABLET | ORAL | Status: DC | PRN

## 2015-07-11 MED ORDER — PREDNISONE 10 MG PO TABS: ORAL_TABLET | ORAL | Status: DC

## 2015-07-11 MED ORDER — AZITHROMYCIN 250 MG PO TABS: 250.0000 mg | ORAL_TABLET | Freq: Every day | ORAL | Status: DC

## 2015-07-11 MED ORDER — PREDNISONE 50 MG PO TABS
60.0000 mg | ORAL_TABLET | Freq: Every day | ORAL | Status: DC
  Administered 2015-07-11: 60 mg via ORAL
  Filled 2015-07-11: qty 1

## 2015-07-11 MED ORDER — ENSURE ENLIVE PO LIQD: 237.0000 mL | ORAL | Status: AC

## 2015-07-11 MED ORDER — ONDANSETRON HCL 4 MG PO TABS: 4.0000 mg | ORAL_TABLET | Freq: Four times a day (QID) | ORAL | Status: DC | PRN

## 2015-07-11 MED ORDER — CEFUROXIME AXETIL 250 MG PO TABS
250.0000 mg | ORAL_TABLET | Freq: Two times a day (BID) | ORAL | Status: DC
  Administered 2015-07-11: 250 mg via ORAL
  Filled 2015-07-11: qty 1

## 2015-07-11 MED ORDER — CEFUROXIME AXETIL 250 MG PO TABS: 250.0000 mg | ORAL_TABLET | Freq: Two times a day (BID) | ORAL | Status: DC

## 2015-07-11 NOTE — Progress Notes (Signed)
Patient is medically stable for D/C to [facility] today. Per Kim admissions coordinator at [facility] patient is going to room 219-B. RN will call report at [phone] and arrange EMS after 2 pm. Clinical Social Worker (CSW) prepared D/C packet and sent D/C Summary to [facility] via carefinder. Patient is aware of above. Patient's daughter Marylu Lund is at bedside and aware of above. Please reconsult if future social work needs arise. CSW signing off.   Jetta Lout, LCSWA [phone]

## 2015-07-11 NOTE — Clinical Social Work Placement (Signed)
   CLINICAL SOCIAL WORK PLACEMENT  NOTE  Date:  07/11/2015  Patient Details  Name: [name] MRN: [id number] Date of Birth: 1928/07/30  Clinical Social Work is seeking post-discharge placement for this patient at the Skilled  Nursing Facility level of care (*CSW will initial, date and re-position this form in  chart as items are completed):  Yes   Patient/family provided with [place] Clinical Social Work Department's list of facilities offering this level of care within the geographic area requested by the patient (or if unable, by the patient's family).  Yes   Patient/family informed of their freedom to choose among providers that offer the needed level of care, that participate in Medicare, Medicaid or managed care program needed by the patient, have an available bed and are willing to accept the patient.  Yes   Patient/family informed of 's ownership interest in [facility] and [facility], as well as of the fact that they are under no obligation to receive care at these facilities.  PASRR submitted to EDS on       PASRR number received on       Existing PASRR number confirmed on 07/10/15     FL2 transmitted to all facilities in geographic area requested by pt/family on 07/10/15     FL2 transmitted to all facilities within larger geographic area on       Patient informed that his/her managed care company has contracts with or will negotiate with certain facilities, including the following:        Yes   Patient/family informed of bed offers received.  Patient chooses bed at  [facility] )     Physician recommends and patient chooses bed at      Patient to be transferred to  [facility] ) on 07/11/15.  Patient to be transferred to facility by  [facility] )     Patient family notified on 07/11/15 of transfer.  Name of family member notified:   ([name] is at bedside and aware of D/C. )     PHYSICIAN        Additional Comment:    _______________________________________________ Haig Prophet, LCSW 07/11/2015, 11:15 AM

## 2015-07-11 NOTE — Progress Notes (Signed)
Spoke with [name], [facility] rep at [phone], to notify of non-emergent EMS transport.  Auth notification reference given as [id number].   Service date range good from 07/11/15 - 10/09/15.   Gap exception requested to determine if services can be considered at an in-network level.

## 2015-07-11 NOTE — Plan of Care (Signed)
Problem: Discharge Progression Outcomes Goal: Other Discharge Outcomes/Goals Plan of care progress to goal: - No complaints of pain. - Continues ABX - Continues  IV soulmedrol. - Continues breathing tx. - Will continue to monitor.

## 2015-07-11 NOTE — Discharge Instructions (Signed)
Needs PT at [facility]

## 2015-07-11 NOTE — Progress Notes (Signed)
MD making rounds. Discharge orders received. Social Worker facilitating discharge to [facility]. IV removed. VSS. No s/s of distress noted. Denies pain. Denies needs. Report called to Victorino Dike, Charity fundraiser at [facility]. No unanswered questions. EMS called for transport. Awaiting EMS for approximately three hours. Phone calls made to EMS dispatch for updated reports. Family notified of updates. EMS on Unit for transport. Discharge Packet given to EMS transporter. Belongings sent with Marylu Lund, patient's daughter.

## 2015-07-11 NOTE — Discharge Summary (Signed)
[name], is a 81 y.o. female  DOB Aug 19, 1928  MRN [id number].  Admission date:  07/10/2015  Admitting Physician  Crissie Figures, MD  Discharge Date:  07/11/2015   Primary MD  [name],[name], MD  Recommendations for primary care physician for things to follow:     Admission Diagnosis  Weakness generalized [R53.1] Hypoxia [R09.02] Hemoptysis [R04.2] Chronic obstructive pulmonary disease, unspecified COPD, unspecified chronic bronchitis type [J44.9]   Discharge Diagnosis  Weakness generalized [R53.1] Hypoxia [R09.02] Hemoptysis [R04.2] Chronic obstructive pulmonary disease, unspecified COPD, unspecified chronic bronchitis type [J44.9]  anemia  Principal Problem:   COPD exacerbation Active Problems:   Hemoptysis   Chronic anemia   Generalized weakness   HTN (hypertension)   Aortic stenosis   Depression   Hypothyroidism   Pressure ulcer stage III      Past Medical History  Diagnosis Date  . Anxiety   . HTN (hypertension)   . Stroke   . Depression   . Aortic stenosis   . ASD (atrial septal defect)   . Osteoporosis, postmenopausal   . AVM (arteriovenous malformation)     pulmonary, s/p cautery  . Anxiety   . RA (rheumatoid arthritis)   . Chronic obstruct airways disease   . Bronchitis   . Anemia   . Hypothyroidism   . Heart murmur     Past Surgical History  Procedure Laterality Date  . Cholecystectomy    . Femur fracture surgery    . Total abdominal hysterectomy    . Fracture surgery         History of present illness and  Hospital Course:     Kindly see H&P for history of present illness and admission details, please review complete Labs, Consult reports and Test reports for all details in brief  HPI  from the history and physical done on the day of admission    Hospital Course    Pt admitted with cough and minimal hemoptysis. CXR cleared. Started on  IV ABX and IV steroids. Seen by Pulmonology. Felt to have COPD flare with bronchitis. Afebrile. WBC normal. Stable on RA. Weak due to recent fall. Seen by PT. SNF recommended.   Discharge Condition: stable   Follow UP  Follow-up Information    Follow up with [name],[name], MD In 1 week.   Specialty:  Internal Medicine   Contact information:   [address] [place] [address] [phone]         Discharge Instructions  and  Discharge Medications   See below     Medication List    STOP taking these medications        heparin 5000 UNIT/ML injection      TAKE these medications        acetaminophen 325 MG tablet  Commonly known as:  TYLENOL  Take 2 tablets (650 mg total) by mouth every 6 (six) hours as needed for mild pain (or Fever >/= 101).     ALPRAZolam 0.25 MG tablet  Commonly  known as:  XANAX  Take 1 tablet (0.25 mg total) by mouth 2 (two) times daily as needed for anxiety.     aspirin 81 MG EC tablet  Take 1 tablet (81 mg total) by mouth daily.     azithromycin 250 MG tablet  Commonly known as:  ZITHROMAX  Take 1 tablet (250 mg total) by mouth daily.     calcium-vitamin D 500-200 MG-UNIT per tablet  Take 1 tablet by mouth daily.     cefUROXime 250 MG tablet  Commonly known as:  CEFTIN  Take 1 tablet (250 mg total) by mouth 2 (two) times daily with a meal.     feeding supplement (ENSURE ENLIVE) Liqd  Take 237 mLs by mouth daily.     guaiFENesin 600 MG 12 hr tablet  Commonly known as:  MUCINEX  Take 1 tablet (600 mg total) by mouth 2 (two) times daily.     ipratropium-albuterol 0.5-2.5 (3) MG/3ML Soln  Commonly known as:  DUONEB  Take 3 mLs by nebulization 4 (four) times daily.     levothyroxine 25 MCG tablet  Commonly known as:  SYNTHROID, LEVOTHROID  Take 25 mcg by mouth daily before breakfast.     metoCLOPramide 5 MG tablet  Commonly known as:  REGLAN  Take 5 mg by mouth 3 (three) times daily before meals.     mirtazapine 30  MG tablet  Commonly known as:  REMERON  Take 30 mg by mouth at bedtime.     ondansetron 4 MG tablet  Commonly known as:  ZOFRAN  Take 1 tablet (4 mg total) by mouth every 6 (six) hours as needed for nausea.     oxyCODONE 5 MG immediate release tablet  Commonly known as:  Oxy IR/ROXICODONE  Take 1 tablet (5 mg total) by mouth every 4 (four) hours as needed for moderate pain.     pantoprazole 40 MG tablet  Commonly known as:  PROTONIX  Take 40 mg by mouth 2 (two) times daily.     polyethylene glycol packet  Commonly known as:  MIRALAX / GLYCOLAX  Take 17 g by mouth daily as needed for mild constipation.     predniSONE 10 MG tablet  Commonly known as:  DELTASONE  Taper 6-6-5-5-4-4-3-3-2-2-1-1-off     senna-docusate 8.6-50 MG per tablet  Commonly known as:  Senokot-S  Take 2 tablets by mouth daily.     traMADol 50 MG tablet  Commonly known as:  ULTRAM  Take by mouth every 6 (six) hours as needed for moderate pain.     Vitamin D-3 1000 UNITS Caps  Take 1 capsule by mouth daily.          Diet and Activity recommendation: See Discharge Instructions above   Consults obtained - Pulmonology   Major procedures and Radiology Reports - PLEASE review detailed and final reports for all details, in brief -   See below   Dg Chest 2 View  07/10/2015   CLINICAL DATA:  Cough. Patient reports coughing up blood for 1 hour. Wheezing.  EXAM: CHEST  2 VIEW  COMPARISON:  Radiograph 07/03/2015.  Chest CT 04/29/2015  FINDINGS: The cardiomediastinal contours are normal. There is atherosclerosis of the aortic arch. Pulmonary vasculature is normal. No consolidation, pleural effusion, or pneumothorax. No acute osseous abnormalities are seen. The bones are under mineralized. An IVC filter is noted in the included upper abdomen.  IMPRESSION: No acute pulmonary process.  No change from prior exam.   Electronically Signed  By: Rubye Oaks M.D.   On: 07/10/2015 01:58   Dg Chest 2  View  07/03/2015   CLINICAL DATA:  Unwitnessed fall.  EXAM: CHEST  2 VIEW  COMPARISON:  April 28, 2015.  FINDINGS: The heart size and mediastinal contours are within normal limits. Both lungs are clear. No pneumothorax or pleural effusion is noted. The visualized skeletal structures are unremarkable.  IMPRESSION: No active cardiopulmonary disease.   Electronically Signed   By: Lupita Raider, M.D.   On: 07/03/2015 11:14   Dg Wrist Complete Left  07/03/2015   CLINICAL DATA:  Status post unwitnessed fall with left wrist deformity and pain.  EXAM: LEFT WRIST - COMPLETE 3+ VIEW  COMPARISON:  None.  FINDINGS: There are comminuted fractures of the distal radial and ulnar shafts. There is mild overriding of the fracture fragments. There is dorsal angulation of the distal fracture fragment of the ulna there radiocarpal articulation appears preserved. There is an associated soft tissue swelling. Intercarpal articulations are poorly evaluated due to positioning. Vascular calcifications are seen.  IMPRESSION: Comminuted impacted fractures of the distal radius and ulna.   Electronically Signed   By: Ted Mcalpine M.D.   On: 07/03/2015 11:21   Dg Shoulder Left  07/03/2015   CLINICAL DATA:  Recent unwitnessed fall with left shoulder pain, initial encounter  EXAM: LEFT SHOULDER - 2+ VIEW  COMPARISON:  None.  FINDINGS: There is a mildly displaced fracture of the distal clavicle identified. Mild degenerative changes arising from the acromion are seen. The proximal humerus is within normal limits. The underlying bony thorax is unremarkable.  IMPRESSION: Mildly displaced distal left clavicular fracture   Electronically Signed   By: Alcide Clever M.D.   On: 07/03/2015 11:21   Dg Knee Complete 4 Views Right  07/03/2015   CLINICAL DATA:  Unwitnessed fall with injury of the left wrist and report of pain the right knee  EXAM: RIGHT KNEE - COMPLETE 4+ VIEW  COMPARISON:  None in PACs  FINDINGS: There is chronic deformity of the  distal right femoral meta diaphysis from previous fracture which been treated with intra- medullary rod and cortical screws. No acute fracture is observed. There is mild degenerative narrowing of the lateral joint compartment. The tibial plateaus are intact and proximal fibula is normal. There are vascular calcifications.  IMPRESSION: There is chronic deformity of distal femoral shaft. There is no acute fracture nor dislocation. There are degenerative changes of the knee joint.   Electronically Signed   By: David  [place] M.D.   On: 07/03/2015 11:21    Micro Results   See below  Recent Results (from the past 240 hour(s))  Culture, blood (routine x 2)     Status: None (Preliminary result)   Collection Time: 07/10/15  1:46 AM  Result Value Ref Range Status   Specimen Description BLOOD RIGHT ANTECUBITAL  Final   Special Requests BOTTLES DRAWN AEROBIC AND ANAEROBIC  Final   Culture NO GROWTH 1 DAY  Final   Report Status PENDING  Incomplete  Culture, blood (routine x 2)     Status: None (Preliminary result)   Collection Time: 07/10/15  1:48 AM  Result Value Ref Range Status   Specimen Description BLOOD RIGHT HAND  Final   Special Requests   Final    BOTTLES DRAWN AEROBIC AND ANAEROBIC ,   Culture NO GROWTH 1 DAY  Final   Report Status PENDING  Incomplete  Culture, sputum-assessment  Status: None   Collection Time: 07/10/15 10:01 AM  Result Value Ref Range Status   Specimen Description SPUTUM  Final   Special Requests NONE  Final   Sputum evaluation THIS SPECIMEN IS ACCEPTABLE FOR SPUTUM CULTURE  Final   Report Status 07/10/2015 FINAL  Final       Today   Subjective:   Purvis Sheffield today has no headache,no chest abdominal pain,no new weakness tingling or numbness, feels much better. Agrees to SNF placement  Objective:   Blood pressure 132/58, pulse 96, temperature 97.9 F (36.6 C), temperature source Oral, resp. rate 18, height 5\' 1"  (1.549 m),  weight 48.943 kg (107 lb 14.4 oz), SpO2 97 %.   Intake/Output Summary (Last 24 hours) at 07/11/15 0709 Last data filed at 07/10/15 1700  Gross per 24 hour  Intake    480 ml  Output    750 ml  Net   -270 ml    Exam Awake Alert, Oriented x 3, No new F.N deficits, Normal affect [place].AT,PERRAL Supple Neck,No JVD, No cervical lymphadenopathy appriciated.  Symmetrical Chest wall movement, Good air movement bilaterally, CTAB RRR,No Gallops,Rubs or new Murmurs, No Parasternal Heave +ve B.Sounds, Abd Soft, Non tender, No organomegaly appriciated, No rebound -guarding or rigidity. No Cyanosis, Clubbing or edema, No new Rash or bruise  Data Review   CBC w Diff: Lab Results  Component Value Date   WBC 7.7 07/11/2015   WBC 7.6 02/06/2015   HGB 9.3* 07/11/2015   HGB 11.7* 02/06/2015   HCT 28.3* 07/11/2015   HCT 35.4 02/06/2015   PLT 216 07/11/2015   PLT 147* 02/06/2015   LYMPHOPCT 17 07/11/2015   LYMPHOPCT 24.1 02/06/2015   MONOPCT 2 07/11/2015   MONOPCT 5.8 02/06/2015   EOSPCT 1 07/11/2015   EOSPCT 1.9 02/06/2015   BASOPCT 0 07/11/2015   BASOPCT 0.3 02/06/2015    CMP: Lab Results  Component Value Date   NA 143 07/11/2015   NA 142 02/06/2015   K 3.7 07/11/2015   K 3.5 02/06/2015   CL 107 07/11/2015   CL 103 02/06/2015   CO2 29 07/11/2015   CO2 31 02/06/2015   BUN 23* 07/11/2015   BUN 19 02/06/2015   CREATININE 0.82 07/11/2015   CREATININE 0.98 02/06/2015   PROT 6.6 07/10/2015   PROT 7.1 04/06/2014   ALBUMIN 3.0* 07/10/2015   ALBUMIN 3.2* 04/06/2014   BILITOT 0.5 07/10/2015   BILITOT 0.3 04/06/2014   ALKPHOS 104 07/10/2015   ALKPHOS 78 04/06/2014   AST 20 07/10/2015   AST 24 04/06/2014   ALT 9* 07/10/2015   ALT 12 04/06/2014  .   Total Time in preparing paper work, data evaluation and todays exam - 45 minutes  [name],[name] M.D on 07/11/2015 at 7:09 AM

## 2015-07-14 LAB — CULTURE, RESPIRATORY

## 2015-07-14 LAB — CULTURE, RESPIRATORY W GRAM STAIN

## 2015-07-15 LAB — CULTURE, BLOOD (ROUTINE X 2)
CULTURE: NO GROWTH
CULTURE: NO GROWTH

## 2015-07-24 ENCOUNTER — Other Ambulatory Visit: Payer: Self-pay | Admitting: Internal Medicine

## 2015-07-24 ENCOUNTER — Other Ambulatory Visit: Payer: Self-pay | Admitting: Unknown Physician Specialty

## 2015-07-24 DIAGNOSIS — T17308A Unspecified foreign body in larynx causing other injury, initial encounter: Secondary | ICD-10-CM

## 2015-07-26 ENCOUNTER — Encounter
Admission: RE | Admit: 2015-07-26 | Discharge: 2015-07-26 | Disposition: A | Discharge status: Home / Self Care | Payer: Medicare Other | Source: Ambulatory Visit | Attending: Internal Medicine | Admitting: Internal Medicine

## 2015-07-29 ENCOUNTER — Ambulatory Visit
Admission: RE | Admit: 2015-07-29 | Discharge: 2015-07-29 | Disposition: A | Discharge status: Home / Self Care | Payer: Medicare Other | Source: Ambulatory Visit | Attending: Internal Medicine | Admitting: Internal Medicine

## 2015-07-29 DIAGNOSIS — R1310 Dysphagia, unspecified: Secondary | ICD-10-CM | POA: Insufficient documentation

## 2015-07-29 DIAGNOSIS — K228 Other specified diseases of esophagus: Secondary | ICD-10-CM | POA: Insufficient documentation

## 2015-07-29 DIAGNOSIS — K219 Gastro-esophageal reflux disease without esophagitis: Secondary | ICD-10-CM | POA: Diagnosis not present

## 2015-07-29 DIAGNOSIS — K449 Diaphragmatic hernia without obstruction or gangrene: Secondary | ICD-10-CM | POA: Insufficient documentation

## 2015-07-29 DIAGNOSIS — T17308A Unspecified foreign body in larynx causing other injury, initial encounter: Secondary | ICD-10-CM

## 2016-03-12 ENCOUNTER — Encounter: Payer: Self-pay | Admitting: Emergency Medicine

## 2016-03-12 ENCOUNTER — Emergency Department: Payer: Medicare Other

## 2016-03-12 ENCOUNTER — Inpatient Hospital Stay
Admission: EM | Admit: 2016-03-12 | Discharge: 2016-03-19 | DRG: 190 | Disposition: A | Discharge status: Home Health | Payer: Medicare Other | Attending: Internal Medicine | Admitting: Internal Medicine

## 2016-03-12 DIAGNOSIS — Z8249 Family history of ischemic heart disease and other diseases of the circulatory system: Secondary | ICD-10-CM | POA: Diagnosis not present

## 2016-03-12 DIAGNOSIS — J441 Chronic obstructive pulmonary disease with (acute) exacerbation: Secondary | ICD-10-CM | POA: Diagnosis present

## 2016-03-12 DIAGNOSIS — R1319 Other dysphagia: Secondary | ICD-10-CM | POA: Diagnosis present

## 2016-03-12 DIAGNOSIS — B961 Klebsiella pneumoniae [K. pneumoniae] as the cause of diseases classified elsewhere: Secondary | ICD-10-CM | POA: Diagnosis present

## 2016-03-12 DIAGNOSIS — N179 Acute kidney failure, unspecified: Secondary | ICD-10-CM

## 2016-03-12 DIAGNOSIS — Z681 Body mass index (BMI) 19 or less, adult: Secondary | ICD-10-CM

## 2016-03-12 DIAGNOSIS — A498 Other bacterial infections of unspecified site: Secondary | ICD-10-CM

## 2016-03-12 DIAGNOSIS — J9622 Acute and chronic respiratory failure with hypercapnia: Secondary | ICD-10-CM | POA: Diagnosis not present

## 2016-03-12 DIAGNOSIS — Z801 Family history of malignant neoplasm of trachea, bronchus and lung: Secondary | ICD-10-CM | POA: Diagnosis not present

## 2016-03-12 DIAGNOSIS — R34 Anuria and oliguria: Secondary | ICD-10-CM | POA: Diagnosis present

## 2016-03-12 DIAGNOSIS — K219 Gastro-esophageal reflux disease without esophagitis: Secondary | ICD-10-CM | POA: Diagnosis present

## 2016-03-12 DIAGNOSIS — Z823 Family history of stroke: Secondary | ICD-10-CM | POA: Diagnosis not present

## 2016-03-12 DIAGNOSIS — E440 Moderate protein-calorie malnutrition: Secondary | ICD-10-CM

## 2016-03-12 DIAGNOSIS — L89153 Pressure ulcer of sacral region, stage 3: Secondary | ICD-10-CM | POA: Diagnosis present

## 2016-03-12 DIAGNOSIS — R32 Unspecified urinary incontinence: Secondary | ICD-10-CM | POA: Diagnosis present

## 2016-03-12 DIAGNOSIS — I350 Nonrheumatic aortic (valve) stenosis: Secondary | ICD-10-CM | POA: Diagnosis present

## 2016-03-12 DIAGNOSIS — R059 Cough, unspecified: Secondary | ICD-10-CM

## 2016-03-12 DIAGNOSIS — I517 Cardiomegaly: Secondary | ICD-10-CM

## 2016-03-12 DIAGNOSIS — I10 Essential (primary) hypertension: Secondary | ICD-10-CM | POA: Diagnosis present

## 2016-03-12 DIAGNOSIS — Q211 Atrial septal defect: Secondary | ICD-10-CM | POA: Diagnosis not present

## 2016-03-12 DIAGNOSIS — Z86718 Personal history of other venous thrombosis and embolism: Secondary | ICD-10-CM | POA: Diagnosis not present

## 2016-03-12 DIAGNOSIS — M810 Age-related osteoporosis without current pathological fracture: Secondary | ICD-10-CM | POA: Diagnosis present

## 2016-03-12 DIAGNOSIS — J440 Chronic obstructive pulmonary disease with acute lower respiratory infection: Principal | ICD-10-CM | POA: Diagnosis present

## 2016-03-12 DIAGNOSIS — B964 Proteus (mirabilis) (morganii) as the cause of diseases classified elsewhere: Secondary | ICD-10-CM | POA: Diagnosis present

## 2016-03-12 DIAGNOSIS — R000 Tachycardia, unspecified: Secondary | ICD-10-CM

## 2016-03-12 DIAGNOSIS — Z66 Do not resuscitate: Secondary | ICD-10-CM | POA: Diagnosis present

## 2016-03-12 DIAGNOSIS — E46 Unspecified protein-calorie malnutrition: Secondary | ICD-10-CM | POA: Diagnosis present

## 2016-03-12 DIAGNOSIS — L8990 Pressure ulcer of unspecified site, unspecified stage: Secondary | ICD-10-CM | POA: Diagnosis present

## 2016-03-12 DIAGNOSIS — Z7982 Long term (current) use of aspirin: Secondary | ICD-10-CM | POA: Diagnosis not present

## 2016-03-12 DIAGNOSIS — J9621 Acute and chronic respiratory failure with hypoxia: Secondary | ICD-10-CM | POA: Diagnosis present

## 2016-03-12 DIAGNOSIS — R05 Cough: Secondary | ICD-10-CM

## 2016-03-12 DIAGNOSIS — J209 Acute bronchitis, unspecified: Secondary | ICD-10-CM

## 2016-03-12 DIAGNOSIS — F419 Anxiety disorder, unspecified: Secondary | ICD-10-CM | POA: Diagnosis present

## 2016-03-12 DIAGNOSIS — E441 Mild protein-calorie malnutrition: Secondary | ICD-10-CM | POA: Diagnosis present

## 2016-03-12 DIAGNOSIS — R0603 Acute respiratory distress: Secondary | ICD-10-CM

## 2016-03-12 DIAGNOSIS — J189 Pneumonia, unspecified organism: Secondary | ICD-10-CM | POA: Diagnosis present

## 2016-03-12 DIAGNOSIS — F329 Major depressive disorder, single episode, unspecified: Secondary | ICD-10-CM | POA: Diagnosis present

## 2016-03-12 DIAGNOSIS — R531 Weakness: Secondary | ICD-10-CM

## 2016-03-12 DIAGNOSIS — K5900 Constipation, unspecified: Secondary | ICD-10-CM | POA: Diagnosis present

## 2016-03-12 DIAGNOSIS — E039 Hypothyroidism, unspecified: Secondary | ICD-10-CM | POA: Diagnosis present

## 2016-03-12 DIAGNOSIS — J9600 Acute respiratory failure, unspecified whether with hypoxia or hypercapnia: Secondary | ICD-10-CM

## 2016-03-12 DIAGNOSIS — M069 Rheumatoid arthritis, unspecified: Secondary | ICD-10-CM | POA: Diagnosis present

## 2016-03-12 DIAGNOSIS — J9690 Respiratory failure, unspecified, unspecified whether with hypoxia or hypercapnia: Secondary | ICD-10-CM

## 2016-03-12 DIAGNOSIS — I340 Nonrheumatic mitral (valve) insufficiency: Secondary | ICD-10-CM

## 2016-03-12 DIAGNOSIS — J9601 Acute respiratory failure with hypoxia: Secondary | ICD-10-CM | POA: Diagnosis not present

## 2016-03-12 DIAGNOSIS — R0600 Dyspnea, unspecified: Secondary | ICD-10-CM | POA: Diagnosis not present

## 2016-03-12 DIAGNOSIS — Z8673 Personal history of transient ischemic attack (TIA), and cerebral infarction without residual deficits: Secondary | ICD-10-CM | POA: Diagnosis not present

## 2016-03-12 DIAGNOSIS — J449 Chronic obstructive pulmonary disease, unspecified: Secondary | ICD-10-CM

## 2016-03-12 DIAGNOSIS — D696 Thrombocytopenia, unspecified: Secondary | ICD-10-CM | POA: Diagnosis present

## 2016-03-12 LAB — CBC WITH DIFFERENTIAL/PLATELET
BASOS ABS: 0.1 10*3/uL (ref 0–0.1)
Eosinophils Absolute: 0.8 10*3/uL — ABNORMAL HIGH (ref 0–0.7)
HCT: 37.8 % (ref 35.0–47.0)
Hemoglobin: 12.6 g/dL (ref 12.0–16.0)
Lymphs Abs: 1.8 10*3/uL (ref 1.0–3.6)
MCH: 33.5 pg (ref 26.0–34.0)
MCHC: 33.4 g/dL (ref 32.0–36.0)
MCV: 100.3 fL — AB (ref 80.0–100.0)
MONO ABS: 0.5 10*3/uL (ref 0.2–0.9)
Monocytes Relative: 4 %
NEUTROS ABS: 8 10*3/uL — AB (ref 1.4–6.5)
Neutrophils Relative %: 72 %
PLATELETS: 144 10*3/uL — AB (ref 150–440)
RBC: 3.77 MIL/uL — AB (ref 3.80–5.20)
RDW: 14.1 % (ref 11.5–14.5)
WBC: 11.1 10*3/uL — AB (ref 3.6–11.0)

## 2016-03-12 LAB — URINALYSIS COMPLETE WITH MICROSCOPIC (ARMC ONLY)
BILIRUBIN URINE: NEGATIVE
GLUCOSE, UA: 50 mg/dL — AB
KETONES UR: NEGATIVE mg/dL
LEUKOCYTES UA: NEGATIVE
NITRITE: NEGATIVE
PH: 6 (ref 5.0–8.0)
Protein, ur: NEGATIVE mg/dL
SPECIFIC GRAVITY, URINE: 1.01 (ref 1.005–1.030)
Squamous Epithelial / LPF: NONE SEEN

## 2016-03-12 LAB — COMPREHENSIVE METABOLIC PANEL
ALBUMIN: 3.9 g/dL (ref 3.5–5.0)
ALK PHOS: 56 U/L (ref 38–126)
ALT: 11 U/L — ABNORMAL LOW (ref 14–54)
ANION GAP: 9 (ref 5–15)
AST: 17 U/L (ref 15–41)
BILIRUBIN TOTAL: 0.6 mg/dL (ref 0.3–1.2)
BUN: 10 mg/dL (ref 6–20)
CALCIUM: 9.4 mg/dL (ref 8.9–10.3)
CO2: 26 mmol/L (ref 22–32)
Chloride: 103 mmol/L (ref 101–111)
Creatinine, Ser: 0.77 mg/dL (ref 0.44–1.00)
GFR calc Af Amer: >60 mL/min (ref >60)
GLUCOSE: 174 mg/dL — AB (ref 65–99)
POTASSIUM: 3.7 mmol/L (ref 3.5–5.1)
Sodium: 138 mmol/L (ref 135–145)
TOTAL PROTEIN: 6.9 g/dL (ref 6.5–8.1)

## 2016-03-12 LAB — BLOOD GAS, ARTERIAL
ALLENS TEST (PASS/FAIL): POSITIVE — AB
Acid-Base Excess: 3 mmol/L (ref 0.0–3.0)
Bicarbonate: 29.8 mEq/L — ABNORMAL HIGH (ref 21.0–28.0)
DELIVERY SYSTEMS: POSITIVE
EXPIRATORY PAP: 5
FIO2: 0.6
INSPIRATORY PAP: 10
MECHANICAL RATE: 10
O2 Saturation: 95.1 %
PCO2 ART: 54 mmHg — AB (ref 32.0–48.0)
Patient temperature: 37
pH, Arterial: 7.35 (ref 7.350–7.450)
pO2, Arterial: 80 mmHg — ABNORMAL LOW (ref 83.0–108.0)

## 2016-03-12 LAB — TROPONIN I: Troponin I: <0.03 ng/mL (ref <0.031)

## 2016-03-12 LAB — BRAIN NATRIURETIC PEPTIDE: B NATRIURETIC PEPTIDE 5: 241 pg/mL — AB (ref 0.0–100.0)

## 2016-03-12 MED ORDER — PANTOPRAZOLE SODIUM 40 MG PO TBEC
40.0000 mg | DELAYED_RELEASE_TABLET | Freq: Two times a day (BID) | ORAL | Status: DC
  Administered 2016-03-13: 40 mg via ORAL
  Filled 2016-03-12: qty 1

## 2016-03-12 MED ORDER — INSULIN ASPART 100 UNIT/ML ~~LOC~~ SOLN
0.0000 [IU] | SUBCUTANEOUS | Status: DC
  Administered 2016-03-13 (×2): 3 [IU] via SUBCUTANEOUS
  Filled 2016-03-12 (×2): qty 3

## 2016-03-12 MED ORDER — IPRATROPIUM-ALBUTEROL 0.5-2.5 (3) MG/3ML IN SOLN
3.0000 mL | Freq: Four times a day (QID) | RESPIRATORY_TRACT | Status: DC
  Administered 2016-03-13 – 2016-03-17 (×20): 3 mL via RESPIRATORY_TRACT
  Filled 2016-03-12 (×20): qty 3

## 2016-03-12 MED ORDER — ONDANSETRON HCL 4 MG/2ML IJ SOLN
4.0000 mg | Freq: Four times a day (QID) | INTRAMUSCULAR | Status: DC
  Administered 2016-03-13 – 2016-03-17 (×11): 4 mg via INTRAVENOUS
  Filled 2016-03-12 (×12): qty 2

## 2016-03-12 MED ORDER — LEVOFLOXACIN IN D5W 500 MG/100ML IV SOLN
500.0000 mg | Freq: Once | INTRAVENOUS | Status: DC
Start: 2016-03-13 — End: 2016-03-13
  Filled 2016-03-12: qty 100

## 2016-03-12 MED ORDER — SODIUM CHLORIDE 0.9 % IV BOLUS (SEPSIS)
500.0000 mL | Freq: Once | INTRAVENOUS | Status: AC
  Administered 2016-03-12: 500 mL via INTRAVENOUS

## 2016-03-12 MED ORDER — LEVOTHYROXINE SODIUM 25 MCG PO TABS
25.0000 ug | ORAL_TABLET | Freq: Every day | ORAL | Status: DC
  Filled 2016-03-12 (×2): qty 1

## 2016-03-12 MED ORDER — SODIUM CHLORIDE 0.9 % IV BOLUS (SEPSIS): 500.0000 mL | Freq: Once | INTRAVENOUS | Status: DC

## 2016-03-12 MED ORDER — METHYLPREDNISOLONE SODIUM SUCC 125 MG IJ SOLR
60.0000 mg | Freq: Two times a day (BID) | INTRAMUSCULAR | Status: DC
  Administered 2016-03-13: 60 mg via INTRAVENOUS
  Filled 2016-03-12: qty 2

## 2016-03-12 MED ORDER — ASPIRIN EC 81 MG PO TBEC
81.0000 mg | DELAYED_RELEASE_TABLET | Freq: Every day | ORAL | Status: DC
  Administered 2016-03-15 – 2016-03-19 (×5): 81 mg via ORAL
  Filled 2016-03-12 (×5): qty 1

## 2016-03-12 MED ORDER — LEVOFLOXACIN IN D5W 250 MG/50ML IV SOLN: 250.0000 mg | INTRAVENOUS | Status: DC

## 2016-03-12 MED ORDER — AZITHROMYCIN 500 MG IV SOLR
500.0000 mg | Freq: Once | INTRAVENOUS | Status: AC
  Administered 2016-03-12: 500 mg via INTRAVENOUS
  Filled 2016-03-12: qty 500

## 2016-03-12 MED ORDER — ALPRAZOLAM 0.25 MG PO TABS: 0.2500 mg | ORAL_TABLET | Freq: Two times a day (BID) | ORAL | Status: DC | PRN

## 2016-03-12 MED ORDER — POLYETHYLENE GLYCOL 3350 17 G PO PACK
17.0000 g | PACK | Freq: Every day | ORAL | Status: DC
  Administered 2016-03-16 – 2016-03-19 (×4): 17 g via ORAL
  Filled 2016-03-12 (×5): qty 1

## 2016-03-12 MED ORDER — ALBUTEROL SULFATE (2.5 MG/3ML) 0.083% IN NEBU
INHALATION_SOLUTION | RESPIRATORY_TRACT | Status: AC
  Administered 2016-03-12: 2.5 mg
  Filled 2016-03-12: qty 3

## 2016-03-12 MED ORDER — MIRTAZAPINE 15 MG PO TABS
30.0000 mg | ORAL_TABLET | Freq: Every day | ORAL | Status: DC
  Administered 2016-03-13 – 2016-03-18 (×6): 30 mg via ORAL
  Filled 2016-03-12 (×6): qty 2

## 2016-03-12 MED ORDER — METHYLPREDNISOLONE SODIUM SUCC 125 MG IJ SOLR
125.0000 mg | Freq: Once | INTRAMUSCULAR | Status: AC
  Administered 2016-03-12: 125 mg via INTRAVENOUS
  Filled 2016-03-12: qty 2

## 2016-03-12 MED ORDER — SODIUM CHLORIDE 0.9 % IV SOLN: 250.0000 mL | INTRAVENOUS | Status: DC | PRN

## 2016-03-12 MED ORDER — SODIUM CHLORIDE 0.9 % IV BOLUS (SEPSIS)
250.0000 mL | Freq: Once | INTRAVENOUS | Status: AC
  Administered 2016-03-12: 250 mL via INTRAVENOUS

## 2016-03-12 MED ORDER — ENSURE ENLIVE PO LIQD: 237.0000 mL | ORAL | Status: DC

## 2016-03-12 MED ORDER — DEXTROSE 5 % IV SOLN
1.0000 g | Freq: Once | INTRAVENOUS | Status: AC
  Administered 2016-03-12: 1 g via INTRAVENOUS
  Filled 2016-03-12: qty 10

## 2016-03-12 MED ORDER — ENOXAPARIN SODIUM 40 MG/0.4ML ~~LOC~~ SOLN
40.0000 mg | SUBCUTANEOUS | Status: DC
  Administered 2016-03-13: 40 mg via SUBCUTANEOUS
  Filled 2016-03-12: qty 0.4

## 2016-03-12 MED ORDER — BUDESONIDE 0.25 MG/2ML IN SUSP
0.2500 mg | Freq: Two times a day (BID) | RESPIRATORY_TRACT | Status: DC
  Administered 2016-03-12 – 2016-03-14 (×4): 0.25 mg via RESPIRATORY_TRACT
  Filled 2016-03-12 (×4): qty 2

## 2016-03-12 MED ORDER — IPRATROPIUM-ALBUTEROL 0.5-2.5 (3) MG/3ML IN SOLN: 3.0000 mL | RESPIRATORY_TRACT | Status: DC | PRN

## 2016-03-12 NOTE — Progress Notes (Signed)
Pharmacy Antibiotic Note  MARGARETHE VIRGEN is a 82 y.o. female admitted on 03/12/2016 with COPD exacerbation/bronchitis.  Pharmacy has been consulted for Levaquin dosing.  Plan: Levaquin 500 mg x1 followed by 250 mg q 24 hours ordered.   Height: 5\' 6"  (167.6 cm) Weight: 100 lb (45.36 kg) IBW/kg (Calculated) : 59.3  Temp (24hrs), Avg:99 F (37.2 C), Min:99 F (37.2 C), Max:99 F (37.2 C)   Recent Labs Lab 03/12/16 1857  WBC 11.1*  CREATININE 0.77    Estimated Creatinine Clearance: 36.8 mL/min (by C-G formula based on Cr of 0.77).    No Known Allergies  Antimicrobials this admission: Levaquin  >>    >>   Dose adjustments this admission:   Microbiology results: 5/19 BCx: pending 5/19 UCx: pending  5/19 Sputum: pending    5/19 CXR: COPD, no infiltrate 5/19 UA: (-)  Thank you for allowing pharmacy to be a part of this patient's care.  Moranda Billiot S 03/12/2016 11:19 PM

## 2016-03-12 NOTE — ED Notes (Signed)
Pt not tolerating removal of Bipap, placed back on at this time by RN. RT called and notified as well. MD Mcshane at bedside at this time

## 2016-03-12 NOTE — ED Notes (Signed)
RT at bedside.

## 2016-03-12 NOTE — ED Notes (Signed)
Resp called regarding art stick. Will be down momentarily.

## 2016-03-12 NOTE — ED Notes (Signed)
Bipap removed, placed on 5L via nasal canula, saturation 93%

## 2016-03-12 NOTE — ED Notes (Signed)
Per MD Mcshane, remove Bipap at this time and assess how pt tolerates. RN at bedside at this time.

## 2016-03-12 NOTE — ED Provider Notes (Addendum)
----------------------------------------- 6:48 PM on 03/12/2016 -----------------------------------------  [facility] Emergency Department Provider Note  ____________________________________________   I have reviewed the triage vital signs and the nursing notes.   HISTORY  Chief Complaint Shortness of Breath    HPI [name] is a 82 y.o. female with a history of COPD, aortic stenosis, hypertension, CVA, depression, AST in the past, anxiety, bronchitis, , and multiple medical problems presents today with acute shortness of breath over the last few days. According to EMS patient has been having a fever and cough for the last 2-3 days. Patient agrees with this history. Difficult for her to give more of a history because she is acutely in respiratory distress. Level V chart caveat      Past Medical History  Diagnosis Date  . Anxiety   . HTN (hypertension)   . Stroke (HCC)   . Depression   . Aortic stenosis   . ASD (atrial septal defect)   . Osteoporosis, postmenopausal   . AVM (arteriovenous malformation)     pulmonary, s/p cautery  . Anxiety   . RA (rheumatoid arthritis) (HCC)   . Chronic obstruct airways disease (HCC)   . Bronchitis   . Anemia   . Hypothyroidism   . Heart murmur     Patient Active Problem List   Diagnosis Date Noted  . COPD exacerbation (HCC) 07/10/2015  . Hemoptysis 07/10/2015  . Chronic anemia 07/10/2015  . Generalized weakness 07/10/2015  . HTN (hypertension) 07/10/2015  . Aortic stenosis 07/10/2015  . Depression 07/10/2015  . Hypothyroidism 07/10/2015  . Pressure ulcer stage III (HCC) 07/10/2015  . Pressure ulcer 04/28/2015  . Acetabular fracture (HCC) 04/27/2015    Past Surgical History  Procedure Laterality Date  . Cholecystectomy    . Femur fracture surgery    . Total abdominal hysterectomy    . Fracture surgery      Current Outpatient Rx  Name  Route  Sig  Dispense  Refill  .  acetaminophen (TYLENOL) 325 MG tablet   Oral   Take 2 tablets (650 mg total) by mouth every 6 (six) hours as needed for mild pain (or Fever >/= 101). Patient taking differently: Take 500 mg by mouth every 6 (six) hours as needed for mild pain (or Fever >/= 101).    100 tablet   0   . ALPRAZolam (XANAX) 0.25 MG tablet   Oral   Take 1 tablet (0.25 mg total) by mouth 2 (two) times daily as needed for anxiety. Patient taking differently: Take 0.25 mg by mouth 2 (two) times daily as needed for anxiety or sleep.    30 tablet   0   . aspirin EC 81 MG EC tablet   Oral   Take 1 tablet (81 mg total) by mouth daily.   100 tablet   0   . azithromycin (ZITHROMAX) 250 MG tablet   Oral   Take 1 tablet (250 mg total) by mouth daily.   5 each   0   . Calcium Carbonate-Vitamin D (CALCIUM-VITAMIN D) 500-200 MG-UNIT per tablet   Oral   Take 1 tablet by mouth daily.         . cefUROXime (CEFTIN) 250 MG tablet   Oral   Take 1 tablet (250 mg total) by mouth 2 (two) times daily with a meal.   20 tablet   0   . Cholecalciferol (VITAMIN D-3) 1000 UNITS CAPS   Oral   Take 1 capsule by  mouth daily.         . feeding supplement, ENSURE ENLIVE, (ENSURE ENLIVE) LIQD   Oral   Take 237 mLs by mouth daily.   237 mL   12   . guaiFENesin (MUCINEX) 600 MG 12 hr tablet   Oral   Take 1 tablet (600 mg total) by mouth 2 (two) times daily. Patient not taking: Reported on 07/10/2015   60 tablet   5   . ipratropium-albuterol (DUONEB) 0.5-2.5 (3) MG/3ML SOLN   Nebulization   Take 3 mLs by nebulization 4 (four) times daily. Patient taking differently: Take 3 mLs by nebulization 2 (two) times daily.    360 mL   5   . levothyroxine (SYNTHROID, LEVOTHROID) 25 MCG tablet   Oral   Take 25 mcg by mouth daily before breakfast.         . metoCLOPramide (REGLAN) 5 MG tablet   Oral   Take 5 mg by mouth 3 (three) times daily before meals.         . mirtazapine (REMERON) 30 MG tablet   Oral   Take  30 mg by mouth at bedtime.         . ondansetron (ZOFRAN) 4 MG tablet   Oral   Take 1 tablet (4 mg total) by mouth every 6 (six) hours as needed for nausea.   20 tablet   0   . oxyCODONE (OXY IR/ROXICODONE) 5 MG immediate release tablet   Oral   Take 1 tablet (5 mg total) by mouth every 4 (four) hours as needed for moderate pain.   30 tablet   0   . pantoprazole (PROTONIX) 40 MG tablet   Oral   Take 40 mg by mouth 2 (two) times daily.         . polyethylene glycol (MIRALAX / GLYCOLAX) packet   Oral   Take 17 g by mouth daily as needed for mild constipation. Patient not taking: Reported on 07/10/2015   14 each   0   . predniSONE (DELTASONE) 10 MG tablet      Taper 6-6-5-5-4-4-3-3-2-2-1-1-off   42 tablet   0   . senna-docusate (SENOKOT-S) 8.6-50 MG per tablet   Oral   Take 2 tablets by mouth daily.         . traMADol (ULTRAM) 50 MG tablet   Oral   Take by mouth every 6 (six) hours as needed for moderate pain.            Allergies Review of patient's allergies indicates no known allergies.  Family History  Problem Relation Age of Onset  . CAD Mother   . Stroke Mother   . Lung cancer Father   . Rheum arthritis Other     Social History Social History  Substance Use Topics  . Smoking status: Never Smoker   . Smokeless tobacco: Never Used  . Alcohol Use: No    Review of Systems Constitutional: Positive fever him a low-grade/chills Eyes: No visual changes. ENT: No sore throat. No stiff neck no neck pain Cardiovascular: Denies chest pain. Respiratory: Positive shortness of breath. Gastrointestinal:   no vomiting.  No diarrhea.  No constipation. Genitourinary: Negative for dysuria. Musculoskeletal: Negative lower extremity swelling Skin: Negative for rash. Neurological: Negative for headaches, focal weakness or numbness. 10-point ROS otherwise negative.  ____________________________________________   PHYSICAL EXAM:  VITAL SIGNS: ED Triage  Vitals  Enc Vitals Group     BP 03/12/16 1843 167/91 mmHg  Pulse Rate 03/12/16 1843 134     Resp 03/12/16 1843 36     Temp 03/12/16 1843 99 F (37.2 C)     Temp Source 03/12/16 1843 Oral     SpO2 03/12/16 1843 89 %     Weight 03/12/16 1843 100 lb (45.36 kg)     Height 03/12/16 1843 5\' 6"  (1.676 m)     Head Cir --      Peak Flow --      Pain Score --      Pain Loc --      Pain Edu? --      Excl. in GC? --     Constitutional: Alert and orientedAcutely ill-appearing working very hard to breathe Eyes: Conjunctivae are normal. PERRL. EOMI. Head: Atraumatic. Nose: No congestion/rhinnorhea. Mouth/Throat: Mucous membranes are moist.  Oropharynx non-erythematous. Neck: No stridor.   Nontender with no meningismus Cardiovascular: Normal rate, regular rhythm. Positive heart. Murmur.  Good peripheral circulation. Respiratory: Increased respiratory effort, diminished in the bases significant bibasilar wheeze rhonchi is also appreciated. Abdominal: Soft and nontender. No distention. No guarding no rebound Back:  There is no focal tenderness or step off there is no midline tenderness there are no lesions noted. there is no CVA tenderness Musculoskeletal: No lower extremity tenderness. No joint effusions, no DVT signs strong distal pulses no edema Neurologic:  Normal speech and language. No gross focal neurologic deficits are appreciated.  Skin:  Skin is warm, dry and intact. No rash noted. Psychiatric: Mood and affect are normal. Speech and behavior are normal.  ____________________________________________   LABS (all labs ordered are listed, but only abnormal results are displayed)  Labs Reviewed  CULTURE, BLOOD (ROUTINE X 2)  CULTURE, BLOOD (ROUTINE X 2)  URINE CULTURE  BRAIN NATRIURETIC PEPTIDE  TROPONIN I  COMPREHENSIVE METABOLIC PANEL  CBC WITH DIFFERENTIAL/PLATELET  URINALYSIS COMPLETEWITH MICROSCOPIC ([facility] ONLY)   ____________________________________________  EKG  I  personally interpreted any EKGs ordered by me or triage  ____________________________________________  RADIOLOGY  I reviewed any imaging ordered by me or triage that were performed during my shift and, if possible, patient and/or family made aware of any abnormal findings. ____________________________________________   PROCEDURES  Procedure(s) performed: None  Critical Care performed: CRITICAL CARE Performed by:   Total critical care time: 55 minutes  Critical care time was exclusive of separately billable procedures and treating other patients.  Critical care was necessary to treat or prevent imminent or life-threatening deterioration.  Critical care was time spent personally by me on the following activities: development of treatment plan with patient and/or surrogate as well as nursing, discussions with consultants, evaluation of patient's response to treatment, examination of patient, obtaining history from patient or surrogate, ordering and performing treatments and interventions, ordering and review of laboratory studies, ordering and review of radiographic studies, pulse oximetry and re-evaluation of patient's condition.   ____________________________________________   INITIAL IMPRESSION / ASSESSMENT AND PLAN / ED COURSE  Pertinent labs & imaging results that were available during my care of the patient were reviewed by me and considered in my medical decision making (see chart for details).  Patient acutely ill today with what is likely COPD/infectious pathology. Chest x-ray is pending giving her IV fluid, we will give her nebulizer treatments, antibiotics empirically, chest x-ray is pending, will obtain blood cultures, given the patient's degree of respiratory distress we will order BiPAP to see if that helps, we have called respiratory for this. Give her steroids and reassessed.Jeanmarie Plant    -----------------------------------------  8:05 PM on  03/12/2016 -----------------------------------------  Multiple different reassessments of the patient reveal patient is greatly improved she is much more comfortable she is not working hard to breathe heart rate is coming down despite the nebs which have tended to increase it in the past, respiratory rate in the low 30s at this time which is an improvement, oxygen saturation some mid 90s and she is doing much better in terms of her overall appearance.  ----------------------------------------- 9:23 PM on 03/12/2016 -----------------------------------------  Patient looks much better at this time breathing very comfortably, lungs are not quite clear with no wheezes greatly diminished she would like to try a trial off BiPAP and we will do so.  ----------------------------------------- 9:44 PM on 03/12/2016 -----------------------------------------  Patient's serum working very hard off BiPAP wheeze continues, discussed with eICU and they will admit the patient. Replacing the BiPAP. Still does not require intubation does well on the BiPAP. Patient is not febrile, she does not have a markedly elevated white count, she has no infiltrate, and her BNP is somewhat elevated. We are giving her ginger IV fluids but there is no evidence of sepsis. ____________________________________________   FINAL CLINICAL IMPRESSION(S) / ED DIAGNOSES  Final diagnoses:  Cough      This chart was dictated using voice recognition software.  Despite best efforts to proofread,  errors can occur which can change meaning.     Jeanmarie Plant, MD 03/12/16 1851  Jeanmarie Plant, MD 03/12/16 2006  Jeanmarie Plant, MD 03/12/16 2123  Jeanmarie Plant, MD 03/12/16 8119  Jeanmarie Plant, MD 03/12/16 1478  Jeanmarie Plant, MD 03/12/16 2146

## 2016-03-12 NOTE — ED Notes (Signed)
Respiratory Therapy notified and arrived. At bedside

## 2016-03-12 NOTE — ED Notes (Signed)
Pt arrived via ems from home with complaints of shortness of breath. Fire Dept first on scene and put pt on 15L O2. EMS arrived and started 1 neb treatment en route. EMS BP 113/70, HR 130, Blood Glucose 161. On arrived pt taken off oxygen and saturation was 86% pt placed on 4L and saturation increased to 89%.

## 2016-03-12 NOTE — H&P (Signed)
PULMONARY / CRITICAL CARE MEDICINE   Name: [name] MRN: [id number] DOB: 05-09-1928    ADMISSION DATE:  03/12/2016    REFERRING MD:  ED  CHIEF COMPLAINT:  Dyspnea and wheezing  HISTORY OF PRESENT ILLNESS:   History is obtained from ED records as patient is on continuous BiPAP and unable to speak clearly. This is an 82 year old Caucasian female with a past medical history of anxiety, hypertension, CVA, aortic stenosis, COPD, bronchitis, hypothyroidism and anemia who presents with shortness of breath. Based on ED records, EMS was called because patient was noted to have progressive shortness of breath. Upon EMS arrival, patient was hypoxic and was 1 nebulizer treatment nebulizer treatment and placed on oxygen. Upon arrival in the ED, her O2 saturation was 89% on 4 L. Patient had a fever and cough for the last 2-3 days. She reports persistent dyspnea despite treatments receiving the ED. She is now on continuous BiPAP. She denies chest pain, abdominal pain, headache, dizziness, but reports cough with mild sputum production, fever and nausea. She was febrile in the ED, but remained tachycardic with heart rate in the 120s. Her chest x-ray shows no infiltrates, but shows emphysematous changes consistent with COPD She is not on home oxygen. She is on Spiriva and prednisone and reports taking all medications as prescribed.  PAST MEDICAL HISTORY :  She  has a past medical history of Anxiety; HTN (hypertension); Stroke [facility]); Depression; Aortic stenosis; ASD (atrial septal defect); Osteoporosis, postmenopausal; AVM (arteriovenous malformation); Anxiety; RA (rheumatoid arthritis) (HCC); Chronic obstruct airways disease (HCC); Bronchitis; Anemia; Hypothyroidism; and Heart murmur.  PAST SURGICAL HISTORY: She  has past surgical history that includes Cholecystectomy; Femur fracture surgery; Total abdominal hysterectomy; and Fracture surgery.  No Known Allergies  No current facility-administered  medications on file prior to encounter.   Current Outpatient Prescriptions on File Prior to Encounter  Medication Sig  . acetaminophen (TYLENOL) 325 MG tablet Take 2 tablets (650 mg total) by mouth every 6 (six) hours as needed for mild pain (or Fever >/= 101). (Patient taking differently: Take 500 mg by mouth every 6 (six) hours as needed for mild pain (or Fever >/= 101). )  . ALPRAZolam (XANAX) 0.25 MG tablet Take 1 tablet (0.25 mg total) by mouth 2 (two) times daily as needed for anxiety. (Patient taking differently: Take 0.25 mg by mouth 2 (two) times daily as needed for anxiety or sleep. )  . aspirin EC 81 MG EC tablet Take 1 tablet (81 mg total) by mouth daily.  . Calcium Carbonate-Vitamin D (CALCIUM-VITAMIN D) 500-200 MG-UNIT per tablet Take 1 tablet by mouth daily.  . cefUROXime (CEFTIN) 250 MG tablet Take 1 tablet (250 mg total) by mouth 2 (two) times daily with a meal.  . Cholecalciferol (VITAMIN D-3) 1000 UNITS CAPS Take 1 capsule by mouth daily.  . feeding supplement, ENSURE ENLIVE, (ENSURE ENLIVE) LIQD Take 237 mLs by mouth daily.  Marland Kitchen ipratropium-albuterol (DUONEB) 0.5-2.5 (3) MG/3ML SOLN Take 3 mLs by nebulization 4 (four) times daily. (Patient taking differently: Take 3 mLs by nebulization 2 (two) times daily. )  . levothyroxine (SYNTHROID, LEVOTHROID) 25 MCG tablet Take 25 mcg by mouth daily before breakfast.  . metoCLOPramide (REGLAN) 5 MG tablet Take 5 mg by mouth 3 (three) times daily before meals.  . mirtazapine (REMERON) 30 MG tablet Take 30 mg by mouth at bedtime.  . pantoprazole (PROTONIX) 40 MG tablet Take 40 mg by mouth 2 (two) times daily.  . predniSONE (DELTASONE) 10 MG  tablet Taper 6-6-5-5-4-4-3-3-2-2-1-1-off  . azithromycin (ZITHROMAX) 250 MG tablet Take 1 tablet (250 mg total) by mouth daily. (Patient not taking: Reported on 03/12/2016)  . guaiFENesin (MUCINEX) 600 MG 12 hr tablet Take 1 tablet (600 mg total) by mouth 2 (two) times daily. (Patient not taking: Reported on  07/10/2015)  . ondansetron (ZOFRAN) 4 MG tablet Take 1 tablet (4 mg total) by mouth every 6 (six) hours as needed for nausea. (Patient not taking: Reported on 03/12/2016)  . polyethylene glycol (MIRALAX / GLYCOLAX) packet Take 17 g by mouth daily as needed for mild constipation. (Patient not taking: Reported on 07/10/2015)    FAMILY HISTORY:  Her has no family status information on file.   SOCIAL HISTORY: She  reports that she has never smoked. She has never used smokeless tobacco. She reports that she does not drink alcohol or use illicit drugs.  REVIEW OF SYSTEMS:   Deferred due to worsening dyspnea when talking on BiPAP  SUBJECTIVE:   VITAL SIGNS: BP 165/91 mmHg  Pulse 110  Temp(Src) 99 F (37.2 C) (Oral)  Resp 26  Ht 5\' 6"  (1.676 m)  Wt 100 lb (45.36 kg)  BMI 16.15 kg/m2  SpO2 95%  HEMODYNAMICS:    VENTILATOR SETTINGS: Vent Mode:  [-]  FiO2 (%):  [60 %] 60 %  INTAKE / OUTPUT:    PHYSICAL EXAMINATION: General: Cachectic and chronically ill-looking Neuro: Alert and oriented 2, follows basic commands HEENT: PERRLA, oral mucosa moist and pink,  trachea midline Cardiovascular: Tachycardic with heart rate 110-112, S1, S2 audible. No murmur, gallop or regurg Lungs:  Labored, diminished breath sounds in all lung fields, expiratory and inspiratory wheezes in all lung fields Abdomen: Benign Musculoskeletal:  Age-related gait abnormalities, ambulates with assistive device, no visible joint deformities. Extremities: +2 pulses bilaterally, no edema Skin:  Warm and dry  LABS:  BMET  Recent Labs Lab 03/12/16 1857  NA 138  K 3.7  CL 103  CO2 26  BUN 10  CREATININE 0.77  GLUCOSE 174*    Electrolytes  Recent Labs Lab 03/12/16 1857  CALCIUM 9.4    CBC  Recent Labs Lab 03/12/16 1857  WBC 11.1*  HGB 12.6  HCT 37.8  PLT 144*    Coag's No results for input(s): APTT, INR in the last 168 hours.  Sepsis Markers No results for input(s): LATICACIDVEN,  PROCALCITON, O2SATVEN in the last 168 hours.  ABG  Recent Labs Lab 03/12/16 2033  PHART 7.35  PCO2ART 54*  PO2ART 80*    Liver Enzymes  Recent Labs Lab 03/12/16 1857  AST 17  ALT 11*  ALKPHOS 56  BILITOT 0.6  ALBUMIN 3.9    Cardiac Enzymes  Recent Labs Lab 03/12/16 1857  TROPONINI <0.03    Glucose No results for input(s): GLUCAP in the last 168 hours.  Imaging Dg Chest Port 1 View  03/12/2016  CLINICAL DATA:  COPD, aortic stenosis, hypertension, stroke, today presents with acute shortness of breath, fever and cough for 2-3 days EXAM: PORTABLE CHEST 1 VIEW COMPARISON:  Portable exam 1847 hours compared to 07/10/2015 FINDINGS: Upper normal heart size. Mediastinal contours and pulmonary vascularity normal. Emphysematous changes consistent with COPD. No acute infiltrate, pleural effusion or pneumothorax. Diffuse osseous demineralization. IMPRESSION: COPD changes without acute infiltrate. Electronically Signed   By: Ulyses Southward M.D.   On: 03/12/2016 19:05    STUDIES:  2-D echo  CULTURES: 03/12/2016 Blood cultures 2 Urine culture Sputum culture if patient is able to expectorate  ANTIBIOTICS:  Levaquin started 03/13/2016  SIGNIFICANT EVENTS: 03/12/2016: Admitted with acute on chronic respiratory failure, acute COPD exacerbation, acute on chronic bronchitis  LINES/TUBES: Peripheral IVs  DISCUSSION: 82 year old Caucasian female presenting with acute on chronic respiratory failure secondary to possible bronchitis, and acute OPD exacerbation necessitating continuous BiPAP  ASSESSMENT / PLAN:  PULMONARY A: Acute on chronic respiratory failure Acute COPD exacerbation. Acute on chronic bronchitis P:   -Nebulized bronchodilators and steroids -IV steroids -Daily chest x-ray when necessary -Empiric antibiotics. -Respiratory cultures -Continuous BiPAP and titrate to nasal cannula or high flow nasal cannula as tolerated  CARDIOVASCULAR A:  History of aortic  stenosis. History of hypertension Tachycardia-likely related to multiple doses of bronchodilators P:  -2-D echo to evaluate left ventricular function -Hemodynamics per ICU protocol  RENAL A:   Urinary incontinence Hypomagnesemia P:   -Monitor and replace electrolytes, magnesium sulfate 2 g IV 1 now -Monitor renal function  GASTROINTESTINAL A:   History of GERD Protein - calorie malnutrition P:   -Protonix 40 mg by mouth twice daily  HEMATOLOGIC A:   Thrombocytopenia P:  -Trend CBC -Lovenox for VTE prophylaxis  INFECTIOUS A:   Acute on chronic bronchitis P:   -Levaquin as scheduled -Follow-up cultures  ENDOCRINE A:   Hypothyroidism  P:   -Resume home dose of Synthroid  -Check TSH  NEUROLOGIC A:   History of CVA History of depression and anxiety P:   RASS goal: N/A -Monitor neurological status -Resume home antidepressants and anxiolytics   Disposition and family update: No family at bedside. CODE STATUS explained to patient and she indicated that she did not want to be intubated or be submitted to CPR for cardiac arrest. Given that she is in acute distress and having difficulty speaking. I will verify CODE STATUS with her family before changing her status to DO NOT RESUSCITATE. Further changes in patient's treatment plan pending clinical course and diagnostics   Critical care time spent titrating BiPAP, reviewing history and labs, CXR, and ABG interpretation is 55 minutes  Magdalene S. [facility] ANP-BC Pulmonary and Critical Care Medicine [facility] Pager [phone] or [phone]  03/12/2016, 10:19 PM  PCCM ATTENDING ATTESTATION: I have evaluated patient with ANP Luci Bank, reviewed database in its entirety and discussed care plan in detail. In addition, this patient was discussed on multidisciplinary rounds.   Important exam findings: Tachypneic and stridorous/wheezing when BiPAP is removed CXR is without acute changes Very thin, frail  appearing  Major problems addressed by PCCM team: Acute respiratory failure History of "COPD" but lifelong nonsmoker - no PFTs to confirm diagnosis. 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 We noted her baseline frailty and declining health status. She has previously executed a Living Will. After discussion regarding options in the event of cardiac or respiratory arrest, she has elected to be DNR/DNI     Billy Fischer, MD PCCM service Mobile [phone] Pager [phone]

## 2016-03-13 ENCOUNTER — Inpatient Hospital Stay (HOSPITAL_COMMUNITY)
Admit: 2016-03-13 | Discharge: 2016-03-13 | Disposition: A | Discharge status: Home / Self Care | Payer: Medicare Other | Attending: Adult Health | Admitting: Adult Health

## 2016-03-13 ENCOUNTER — Inpatient Hospital Stay: Payer: Medicare Other

## 2016-03-13 DIAGNOSIS — R0600 Dyspnea, unspecified: Secondary | ICD-10-CM

## 2016-03-13 LAB — MAGNESIUM: Magnesium: 1.3 mg/dL — ABNORMAL LOW (ref 1.7–2.4)

## 2016-03-13 LAB — CBC
HEMATOCRIT: 37.8 % (ref 35.0–47.0)
Hemoglobin: 12.9 g/dL (ref 12.0–16.0)
MCH: 34.1 pg — AB (ref 26.0–34.0)
MCHC: 34.1 g/dL (ref 32.0–36.0)
MCV: 99.9 fL (ref 80.0–100.0)
PLATELETS: 128 10*3/uL — AB (ref 150–440)
RBC: 3.79 MIL/uL — ABNORMAL LOW (ref 3.80–5.20)
RDW: 13.7 % (ref 11.5–14.5)
WBC: 10.8 10*3/uL (ref 3.6–11.0)

## 2016-03-13 LAB — GLUCOSE, CAPILLARY
GLUCOSE-CAPILLARY: 108 mg/dL — AB (ref 65–99)
GLUCOSE-CAPILLARY: 92 mg/dL (ref 65–99)
GLUCOSE-CAPILLARY: 96 mg/dL (ref 65–99)
Glucose-Capillary: 100 mg/dL — ABNORMAL HIGH (ref 65–99)
Glucose-Capillary: 104 mg/dL — ABNORMAL HIGH (ref 65–99)
Glucose-Capillary: 163 mg/dL — ABNORMAL HIGH (ref 65–99)
Glucose-Capillary: 175 mg/dL — ABNORMAL HIGH (ref 65–99)

## 2016-03-13 LAB — MRSA PCR SCREENING: MRSA by PCR: POSITIVE — AB

## 2016-03-13 LAB — BLOOD GAS, ARTERIAL
Acid-Base Excess: 3.3 mmol/L — ABNORMAL HIGH (ref 0.0–3.0)
Allens test (pass/fail): POSITIVE — AB
Bicarbonate: 31.2 mEq/L — ABNORMAL HIGH (ref 21.0–28.0)
DELIVERY SYSTEMS: POSITIVE
EXPIRATORY PAP: 6
FIO2: 0.6
INSPIRATORY PAP: 12
Mechanical Rate: 12
O2 SAT: 96.4 %
PATIENT TEMPERATURE: 37
PCO2 ART: 62 mmHg — AB (ref 32.0–48.0)
PO2 ART: 92 mmHg (ref 83.0–108.0)
pH, Arterial: 7.31 — ABNORMAL LOW (ref 7.350–7.450)

## 2016-03-13 LAB — FIBRIN DERIVATIVES D-DIMER (ARMC ONLY): FIBRIN DERIVATIVES D-DIMER (ARMC): 1966 — AB (ref 0–499)

## 2016-03-13 LAB — PROTIME-INR
INR: 1.02
Prothrombin Time: 13.6 seconds (ref 11.4–15.0)

## 2016-03-13 LAB — ECHOCARDIOGRAM COMPLETE
Height: 66 in
WEIGHTICAEL: 1600 [oz_av]

## 2016-03-13 LAB — BASIC METABOLIC PANEL
Anion gap: 7 (ref 5–15)
BUN: 8 mg/dL (ref 6–20)
CALCIUM: 9.2 mg/dL (ref 8.9–10.3)
CO2: 29 mmol/L (ref 22–32)
Chloride: 100 mmol/L — ABNORMAL LOW (ref 101–111)
Creatinine, Ser: 0.74 mg/dL (ref 0.44–1.00)
GFR calc Af Amer: >60 mL/min (ref >60)
GLUCOSE: 149 mg/dL — AB (ref 65–99)
POTASSIUM: 4.1 mmol/L (ref 3.5–5.1)
Sodium: 136 mmol/L (ref 135–145)

## 2016-03-13 LAB — APTT: aPTT: 34 seconds (ref 24–36)

## 2016-03-13 LAB — TSH: TSH: 0.854 u[IU]/mL (ref 0.350–4.500)

## 2016-03-13 LAB — TROPONIN I
TROPONIN I: 0.03 ng/mL (ref <0.031)
Troponin I: 0.03 ng/mL (ref <0.031)

## 2016-03-13 LAB — HEPARIN LEVEL (UNFRACTIONATED): Heparin Unfractionated: 0.55 IU/mL (ref 0.30–0.70)

## 2016-03-13 LAB — PHOSPHORUS: Phosphorus: 3.3 mg/dL (ref 2.5–4.6)

## 2016-03-13 MED ORDER — MUPIROCIN 2 % EX OINT
1.0000 "application " | TOPICAL_OINTMENT | Freq: Two times a day (BID) | CUTANEOUS | Status: AC
  Administered 2016-03-13 – 2016-03-17 (×9): 1 via NASAL
  Filled 2016-03-13 (×2): qty 22

## 2016-03-13 MED ORDER — IPRATROPIUM-ALBUTEROL 0.5-2.5 (3) MG/3ML IN SOLN: 3.0000 mL | RESPIRATORY_TRACT | Status: DC | PRN

## 2016-03-13 MED ORDER — LACTATED RINGERS IV SOLN
INTRAVENOUS | Status: DC
  Administered 2016-03-13 – 2016-03-15 (×3): via INTRAVENOUS

## 2016-03-13 MED ORDER — HEPARIN (PORCINE) IN NACL 100-0.45 UNIT/ML-% IJ SOLN
700.0000 [IU]/h | INTRAMUSCULAR | Status: DC
  Administered 2016-03-13: 700 [IU]/h via INTRAVENOUS
  Filled 2016-03-13 (×2): qty 250

## 2016-03-13 MED ORDER — MAGNESIUM SULFATE 2 GM/50ML IV SOLN
2.0000 g | Freq: Once | INTRAVENOUS | Status: AC
  Administered 2016-03-13: 2 g via INTRAVENOUS
  Filled 2016-03-13: qty 50

## 2016-03-13 MED ORDER — CETYLPYRIDINIUM CHLORIDE 0.05 % MT LIQD
7.0000 mL | Freq: Two times a day (BID) | OROMUCOSAL | Status: DC
  Administered 2016-03-13 – 2016-03-18 (×11): 7 mL via OROMUCOSAL

## 2016-03-13 MED ORDER — METHYLPREDNISOLONE SODIUM SUCC 40 MG IJ SOLR
40.0000 mg | Freq: Two times a day (BID) | INTRAMUSCULAR | Status: AC
  Administered 2016-03-13 – 2016-03-16 (×7): 40 mg via INTRAVENOUS
  Filled 2016-03-13 (×7): qty 1

## 2016-03-13 MED ORDER — CHLORHEXIDINE GLUCONATE 0.12 % MT SOLN
15.0000 mL | Freq: Two times a day (BID) | OROMUCOSAL | Status: DC
  Administered 2016-03-13 – 2016-03-19 (×13): 15 mL via OROMUCOSAL
  Filled 2016-03-13 (×13): qty 15

## 2016-03-13 MED ORDER — CHLORHEXIDINE GLUCONATE CLOTH 2 % EX PADS
6.0000 | MEDICATED_PAD | Freq: Every day | CUTANEOUS | Status: AC
  Administered 2016-03-13 – 2016-03-16 (×4): 6 via TOPICAL

## 2016-03-13 MED ORDER — HEPARIN BOLUS VIA INFUSION
2700.0000 [IU] | Freq: Once | INTRAVENOUS | Status: AC
  Administered 2016-03-13: 2700 [IU] via INTRAVENOUS
  Filled 2016-03-13: qty 2700

## 2016-03-13 MED ORDER — MORPHINE SULFATE (PF) 2 MG/ML IV SOLN
1.0000 mg | INTRAVENOUS | Status: DC | PRN
  Administered 2016-03-13 (×3): 2 mg via INTRAVENOUS
  Filled 2016-03-13 (×3): qty 1

## 2016-03-13 NOTE — Progress Notes (Signed)
*  PRELIMINARY RESULTS* °Echocardiogram °2D Echocardiogram has been performed. ° °[name] °03/13/2016, 3:13 PM °

## 2016-03-13 NOTE — Progress Notes (Signed)
Pt continues having labored breathing still on BI-PAP. Pt given morphine to help her rest. Suzetta Timko E 2:46 PM 03/13/2016

## 2016-03-13 NOTE — Progress Notes (Addendum)
ANTICOAGULATION CONSULT NOTE - Initial Consult  Pharmacy Consult for Heparin  Indication: pulmonary embolus  No Known Allergies  Patient Measurements: Height: 5\' 6"  (167.6 cm) Weight: 100 lb (45.36 kg) IBW/kg (Calculated) : 59.3 Heparin Dosing Weight: 45.4 kg   Vital Signs: Temp: 99.5 F (37.5 C) (05/20 2000) Temp Source: Axillary (05/20 2000) BP: 139/78 mmHg (05/20 2300) Pulse Rate: 120 (05/20 2300)  Labs:  Recent Labs  03/12/16 1857 03/13/16 0453 03/13/16 0926 03/13/16 1226 03/13/16 2248  HGB 12.6 12.9  --   --   --   HCT 37.8 37.8  --   --   --   PLT 144* 128*  --   --   --   APTT  --   --   --  34  --   LABPROT  --   --   --  13.6  --   INR  --   --   --  1.02  --   HEPARINUNFRC  --   --   --   --  0.55  CREATININE 0.77 0.74  --   --   --   TROPONINI <0.03 0.03 0.03  --   --     Estimated Creatinine Clearance: 36.8 mL/min (by C-G formula based on Cr of 0.74).   Medical History: Past Medical History  Diagnosis Date  . Anxiety   . HTN (hypertension)   . Stroke (HCC)   . Depression   . Aortic stenosis   . ASD (atrial septal defect)   . Osteoporosis, postmenopausal   . AVM (arteriovenous malformation)     pulmonary, s/p cautery  . Anxiety   . RA (rheumatoid arthritis) (HCC)   . Chronic obstruct airways disease (HCC)   . Bronchitis   . Anemia   . Hypothyroidism   . Heart murmur     Medications:  Prescriptions prior to admission  Medication Sig Dispense Refill Last Dose  . acetaminophen (TYLENOL) 325 MG tablet Take 2 tablets (650 mg total) by mouth every 6 (six) hours as needed for mild pain (or Fever >/= 101). (Patient taking differently: Take 500 mg by mouth every 6 (six) hours as needed for mild pain (or Fever >/= 101). ) 100 tablet 0 03/12/2016 at Unknown time  . ALPRAZolam (XANAX) 0.25 MG tablet Take 1 tablet (0.25 mg total) by mouth 2 (two) times daily as needed for anxiety. (Patient taking differently: Take 0.25 mg by mouth 2 (two) times daily  as needed for anxiety or sleep. ) 30 tablet 0 PRN at PRN  . aspirin EC 81 MG EC tablet Take 1 tablet (81 mg total) by mouth daily. 100 tablet 0 03/12/2016 at Unknown time  . Calcium Carbonate-Vitamin D (CALCIUM-VITAMIN D) 500-200 MG-UNIT per tablet Take 1 tablet by mouth daily.   03/12/2016 at Unknown time  . cefUROXime (CEFTIN) 250 MG tablet Take 1 tablet (250 mg total) by mouth 2 (two) times daily with a meal. 20 tablet 0 03/12/2016 at Unknown time  . Cholecalciferol (VITAMIN D-3) 1000 UNITS CAPS Take 1 capsule by mouth daily.   03/12/2016 at Unknown time  . feeding supplement, ENSURE ENLIVE, (ENSURE ENLIVE) LIQD Take 237 mLs by mouth daily. 237 mL 12 03/12/2016 at Unknown time  . ipratropium-albuterol (DUONEB) 0.5-2.5 (3) MG/3ML SOLN Take 3 mLs by nebulization 4 (four) times daily. (Patient taking differently: Take 3 mLs by nebulization 2 (two) times daily. ) 360 mL 5 03/12/2016 at Unknown time  . levothyroxine (SYNTHROID, LEVOTHROID) 25 MCG tablet  Take 25 mcg by mouth daily before breakfast.   03/12/2016 at Unknown time  . metoCLOPramide (REGLAN) 5 MG tablet Take 5 mg by mouth 3 (three) times daily before meals.   03/12/2016 at Unknown time  . mirtazapine (REMERON) 30 MG tablet Take 30 mg by mouth at bedtime.   03/11/2016 at Unknown time  . Multiple Vitamin (MULTIVITAMIN WITH MINERALS) TABS tablet Take 1 tablet by mouth daily.   03/12/2016 at Unknown time  . pantoprazole (PROTONIX) 40 MG tablet Take 40 mg by mouth 2 (two) times daily.   03/12/2016 at Unknown time  . predniSONE (DELTASONE) 10 MG tablet Taper 6-6-5-5-4-4-3-3-2-2-1-1-off 42 tablet 0 03/12/2016 at Unknown time  . SPIRIVA HANDIHALER 18 MCG inhalation capsule Place 1 capsule into inhaler and inhale daily.   03/12/2016 at Unknown time  . azithromycin (ZITHROMAX) 250 MG tablet Take 1 tablet (250 mg total) by mouth daily. (Patient not taking: Reported on 03/12/2016) 5 each 0 Not Taking at Unknown time  . guaiFENesin (MUCINEX) 600 MG 12 hr tablet Take 1  tablet (600 mg total) by mouth 2 (two) times daily. (Patient not taking: Reported on 07/10/2015) 60 tablet 5 Not Taking at Unknown time  . ondansetron (ZOFRAN) 4 MG tablet Take 1 tablet (4 mg total) by mouth every 6 (six) hours as needed for nausea. (Patient not taking: Reported on 03/12/2016) 20 tablet 0 Not Taking at Unknown time  . polyethylene glycol (MIRALAX / GLYCOLAX) packet Take 17 g by mouth daily as needed for mild constipation. (Patient not taking: Reported on 07/10/2015) 14 each 0 Not Taking at Unknown time  . senna-docusate (SENOKOT-S) 8.6-50 MG tablet Take 2 tablets by mouth daily.   Unknown at Unknown time    Assessment: Pharmacy consulted to dose heparin in this 82 year old female suspected PE.  Pt received lovenox 40 mg SQ X 1 on 5/20 @ 00:30.   Will still bolus this pt.  CrCl = 36.8 ml/min  Goal of Therapy:  Heparin level 0.3-0.7 units/ml Monitor platelets by anticoagulation protocol: Yes   Plan:  Give 2700 units bolus x 1 Start heparin infusion at 700 units/hr  Will order baseline aPTT and INR. Will order HL 8 hrs after start of drip.   5/20 PM heparin level 0.55. Recheck heparin level with AM labs to confirm. CBC already ordered by MD.  5/21 AM heparin level 0.46. Continue current regimen. Recheck CBC and heparin level tomorrow AM.  Travaris Kosh S 03/13/2016,11:36 PM

## 2016-03-13 NOTE — Progress Notes (Signed)
Initial Nutrition Assessment  DOCUMENTATION CODES:   Non-severe (moderate) malnutrition in context of chronic illness  INTERVENTION:  -Await diet advancement as medically able; pt may benefit from smaller, more frequent meals -Pt drinks Ensure as outpatient, likes Vanilla. Recommend sending BID once diet advanced   NUTRITION DIAGNOSIS:   Malnutrition related to chronic illness as evidenced by mild depletion of body fat, mild depletion of muscle mass.  GOAL:   Patient will meet greater than or equal to 90% of their needs  MONITOR:   Diet advancement, Labs, Weight trends  REASON FOR ASSESSMENT:    (Underweight)    ASSESSMENT:   82 yo female admitted with acute on chronic respiratory failure with acute COPD exacerbation, currently on continuous Bipap. Trial of [place] failed this AM per Christina RN  Pt currently NPO as unable to safely take po, although pt asking for breakfast on visit today. Pt wears dentures, does not have at present but reports family is brining for her. Pt currently on Bipap, asked yes or no questions mostly. Pt reports appetite has not been good. Did not take full diet history due to respiratory status.  Reports she has lost some weight and states UBW is 105 pounds. Current wt recorded at 100 pounds.     Past Medical History  Diagnosis Date  . Anxiety   . HTN (hypertension)   . Stroke (HCC)   . Depression   . Aortic stenosis   . ASD (atrial septal defect)   . Osteoporosis, postmenopausal   . AVM (arteriovenous malformation)     pulmonary, s/p cautery  . Anxiety   . RA (rheumatoid arthritis) (HCC)   . Chronic obstruct airways disease (HCC)   . Bronchitis   . Anemia   . Hypothyroidism   . Heart murmur     Diet Order:  Diet NPO time specified Except for: Sips with Meds  Skin:  Reviewed, no issues  Last BM:  no documented BM since admisison  Height:   Ht Readings from Last 1 Encounters:  03/12/16 5\' 6"  (1.676 m)    Weight: per pt report,  weight down by 4.7% but unsure of time frame.  Wt Readings from Last 1 Encounters:  03/12/16 100 lb (45.36 kg)    Ideal Body Weight:  59 kg  BMI:  Body mass index is 16.15 kg/(m^2).  Estimated Nutritional Needs:   Kcal:  1350-1575 kcals   Protein:  54-63 g  Fluid:  >/= 1.3 L  EDUCATION NEEDS:   No education needs identified at this time  03/14/16 MS, RD, LDN [phone] Pager  [phone] Weekend/On-Call Pager

## 2016-03-13 NOTE — Progress Notes (Signed)
ANTICOAGULATION CONSULT NOTE - Initial Consult  Pharmacy Consult for Heparin  Indication: pulmonary embolus  No Known Allergies  Patient Measurements: Height: 5\' 6"  (167.6 cm) Weight: 100 lb (45.36 kg) IBW/kg (Calculated) : 59.3 Heparin Dosing Weight: 45.4 kg   Vital Signs: Temp: 97 F (36.1 C) (05/20 0800) Temp Source: Axillary (05/20 0800) BP: 149/70 mmHg (05/20 0800) Pulse Rate: 101 (05/20 0800)  Labs:  Recent Labs  03/12/16 1857 03/13/16 0453 03/13/16 0926  HGB 12.6 12.9  --   HCT 37.8 37.8  --   PLT 144* 128*  --   CREATININE 0.77 0.74  --   TROPONINI <0.03 0.03 0.03    Estimated Creatinine Clearance: 36.8 mL/min (by C-G formula based on Cr of 0.74).   Medical History: Past Medical History  Diagnosis Date  . Anxiety   . HTN (hypertension)   . Stroke (HCC)   . Depression   . Aortic stenosis   . ASD (atrial septal defect)   . Osteoporosis, postmenopausal   . AVM (arteriovenous malformation)     pulmonary, s/p cautery  . Anxiety   . RA (rheumatoid arthritis) (HCC)   . Chronic obstruct airways disease (HCC)   . Bronchitis   . Anemia   . Hypothyroidism   . Heart murmur     Medications:  Prescriptions prior to admission  Medication Sig Dispense Refill Last Dose  . acetaminophen (TYLENOL) 325 MG tablet Take 2 tablets (650 mg total) by mouth every 6 (six) hours as needed for mild pain (or Fever >/= 101). (Patient taking differently: Take 500 mg by mouth every 6 (six) hours as needed for mild pain (or Fever >/= 101). ) 100 tablet 0 03/12/2016 at Unknown time  . ALPRAZolam (XANAX) 0.25 MG tablet Take 1 tablet (0.25 mg total) by mouth 2 (two) times daily as needed for anxiety. (Patient taking differently: Take 0.25 mg by mouth 2 (two) times daily as needed for anxiety or sleep. ) 30 tablet 0 PRN at PRN  . aspirin EC 81 MG EC tablet Take 1 tablet (81 mg total) by mouth daily. 100 tablet 0 03/12/2016 at Unknown time  . Calcium Carbonate-Vitamin D (CALCIUM-VITAMIN  D) 500-200 MG-UNIT per tablet Take 1 tablet by mouth daily.   03/12/2016 at Unknown time  . cefUROXime (CEFTIN) 250 MG tablet Take 1 tablet (250 mg total) by mouth 2 (two) times daily with a meal. 20 tablet 0 03/12/2016 at Unknown time  . Cholecalciferol (VITAMIN D-3) 1000 UNITS CAPS Take 1 capsule by mouth daily.   03/12/2016 at Unknown time  . feeding supplement, ENSURE ENLIVE, (ENSURE ENLIVE) LIQD Take 237 mLs by mouth daily. 237 mL 12 03/12/2016 at Unknown time  . ipratropium-albuterol (DUONEB) 0.5-2.5 (3) MG/3ML SOLN Take 3 mLs by nebulization 4 (four) times daily. (Patient taking differently: Take 3 mLs by nebulization 2 (two) times daily. ) 360 mL 5 03/12/2016 at Unknown time  . levothyroxine (SYNTHROID, LEVOTHROID) 25 MCG tablet Take 25 mcg by mouth daily before breakfast.   03/12/2016 at Unknown time  . metoCLOPramide (REGLAN) 5 MG tablet Take 5 mg by mouth 3 (three) times daily before meals.   03/12/2016 at Unknown time  . mirtazapine (REMERON) 30 MG tablet Take 30 mg by mouth at bedtime.   03/11/2016 at Unknown time  . Multiple Vitamin (MULTIVITAMIN WITH MINERALS) TABS tablet Take 1 tablet by mouth daily.   03/12/2016 at Unknown time  . pantoprazole (PROTONIX) 40 MG tablet Take 40 mg by mouth 2 (two) times  daily.   03/12/2016 at Unknown time  . predniSONE (DELTASONE) 10 MG tablet Taper 6-6-5-5-4-4-3-3-2-2-1-1-off 42 tablet 0 03/12/2016 at Unknown time  . SPIRIVA HANDIHALER 18 MCG inhalation capsule Place 1 capsule into inhaler and inhale daily.   03/12/2016 at Unknown time  . azithromycin (ZITHROMAX) 250 MG tablet Take 1 tablet (250 mg total) by mouth daily. (Patient not taking: Reported on 03/12/2016) 5 each 0 Not Taking at Unknown time  . guaiFENesin (MUCINEX) 600 MG 12 hr tablet Take 1 tablet (600 mg total) by mouth 2 (two) times daily. (Patient not taking: Reported on 07/10/2015) 60 tablet 5 Not Taking at Unknown time  . ondansetron (ZOFRAN) 4 MG tablet Take 1 tablet (4 mg total) by mouth every 6  (six) hours as needed for nausea. (Patient not taking: Reported on 03/12/2016) 20 tablet 0 Not Taking at Unknown time  . polyethylene glycol (MIRALAX / GLYCOLAX) packet Take 17 g by mouth daily as needed for mild constipation. (Patient not taking: Reported on 07/10/2015) 14 each 0 Not Taking at Unknown time  . senna-docusate (SENOKOT-S) 8.6-50 MG tablet Take 2 tablets by mouth daily.   Unknown at Unknown time    Assessment: Pharmacy consulted to dose heparin in this 82 year old female suspected PE.  Pt received lovenox 40 mg SQ X 1 on 5/20 @ 00:30.   Will still bolus this pt.  CrCl = 36.8 ml/min  Goal of Therapy:  Heparin level 0.3-0.7 units/ml Monitor platelets by anticoagulation protocol: Yes   Plan:  Give 2700 units bolus x 1 Start heparin infusion at 700 units/hr  Will order baseline aPTT and INR. Will order HL 8 hrs after start of drip.   Shaquoia Miers D 03/13/2016,11:59 AM

## 2016-03-13 NOTE — Progress Notes (Signed)
Pt taken of BI-PAP for mouth swab. Pt placed on 4 liters of oxygen via nasal cannula. Pt O2 sats remaind in the 90's while of BI-PAP but patient started having increase work of breathing and she felt short of breath. Eusevio Schriver E 11:18 AM 03/13/2016

## 2016-03-14 ENCOUNTER — Inpatient Hospital Stay: Payer: Medicare Other

## 2016-03-14 DIAGNOSIS — J9622 Acute and chronic respiratory failure with hypercapnia: Secondary | ICD-10-CM

## 2016-03-14 LAB — URINE CULTURE: Culture: NO GROWTH

## 2016-03-14 LAB — CBC
HEMATOCRIT: 37 % (ref 35.0–47.0)
HEMOGLOBIN: 12.5 g/dL (ref 12.0–16.0)
MCH: 34 pg (ref 26.0–34.0)
MCHC: 33.8 g/dL (ref 32.0–36.0)
MCV: 100.6 fL — ABNORMAL HIGH (ref 80.0–100.0)
Platelets: 123 10*3/uL — ABNORMAL LOW (ref 150–440)
RBC: 3.67 MIL/uL — ABNORMAL LOW (ref 3.80–5.20)
RDW: 14.2 % (ref 11.5–14.5)
WBC: 10.1 10*3/uL (ref 3.6–11.0)

## 2016-03-14 LAB — COMPREHENSIVE METABOLIC PANEL
ALBUMIN: 3.9 g/dL (ref 3.5–5.0)
ALK PHOS: 52 U/L (ref 38–126)
ALT: 18 U/L (ref 14–54)
AST: 33 U/L (ref 15–41)
Anion gap: 9 (ref 5–15)
BILIRUBIN TOTAL: 0.5 mg/dL (ref 0.3–1.2)
BUN: 19 mg/dL (ref 6–20)
CALCIUM: 9 mg/dL (ref 8.9–10.3)
CO2: 29 mmol/L (ref 22–32)
Chloride: 100 mmol/L — ABNORMAL LOW (ref 101–111)
Creatinine, Ser: 1.01 mg/dL — ABNORMAL HIGH (ref 0.44–1.00)
GFR calc Af Amer: 57 mL/min — ABNORMAL LOW (ref >60)
GFR calc non Af Amer: 49 mL/min — ABNORMAL LOW (ref >60)
GLUCOSE: 97 mg/dL (ref 65–99)
Potassium: 4.7 mmol/L (ref 3.5–5.1)
Sodium: 138 mmol/L (ref 135–145)
TOTAL PROTEIN: 6.9 g/dL (ref 6.5–8.1)

## 2016-03-14 LAB — HEPARIN LEVEL (UNFRACTIONATED): Heparin Unfractionated: 0.46 IU/mL (ref 0.30–0.70)

## 2016-03-14 LAB — GLUCOSE, CAPILLARY
Glucose-Capillary: 92 mg/dL (ref 65–99)
Glucose-Capillary: 99 mg/dL (ref 65–99)
Glucose-Capillary: 129 mg/dL — ABNORMAL HIGH (ref 65–99)

## 2016-03-14 LAB — MAGNESIUM: MAGNESIUM: 1.9 mg/dL (ref 1.7–2.4)

## 2016-03-14 MED ORDER — ALBUTEROL SULFATE (2.5 MG/3ML) 0.083% IN NEBU: 2.5000 mg | INHALATION_SOLUTION | RESPIRATORY_TRACT | Status: DC | PRN

## 2016-03-14 MED ORDER — PANTOPRAZOLE SODIUM 40 MG IV SOLR
40.0000 mg | INTRAVENOUS | Status: DC
  Administered 2016-03-14 – 2016-03-17 (×4): 40 mg via INTRAVENOUS
  Filled 2016-03-14 (×4): qty 40

## 2016-03-14 MED ORDER — PANTOPRAZOLE SODIUM 40 MG PO TBEC: 40.0000 mg | DELAYED_RELEASE_TABLET | Freq: Every day | ORAL | Status: DC

## 2016-03-14 MED ORDER — ENOXAPARIN SODIUM 30 MG/0.3ML ~~LOC~~ SOLN: 30.0000 mg | SUBCUTANEOUS | Status: DC

## 2016-03-14 MED ORDER — LEVOTHYROXINE SODIUM 100 MCG IV SOLR
12.5000 ug | Freq: Every day | INTRAVENOUS | Status: DC
  Administered 2016-03-14 – 2016-03-17 (×4): 12.5 ug via INTRAVENOUS
  Filled 2016-03-14 (×5): qty 5

## 2016-03-14 MED ORDER — ENOXAPARIN SODIUM 30 MG/0.3ML ~~LOC~~ SOLN
30.0000 mg | SUBCUTANEOUS | Status: DC
  Administered 2016-03-14 – 2016-03-16 (×3): 30 mg via SUBCUTANEOUS
  Filled 2016-03-14 (×3): qty 0.3

## 2016-03-14 MED ORDER — DOXYCYCLINE HYCLATE 100 MG IV SOLR
100.0000 mg | Freq: Two times a day (BID) | INTRAVENOUS | Status: DC
  Administered 2016-03-14 – 2016-03-16 (×6): 100 mg via INTRAVENOUS
  Filled 2016-03-14 (×9): qty 100

## 2016-03-14 MED ORDER — BUDESONIDE 0.25 MG/2ML IN SUSP
0.2500 mg | Freq: Four times a day (QID) | RESPIRATORY_TRACT | Status: DC
  Administered 2016-03-14 – 2016-03-17 (×14): 0.25 mg via RESPIRATORY_TRACT
  Filled 2016-03-14 (×14): qty 2

## 2016-03-14 NOTE — Progress Notes (Signed)
PULMONARY / CRITICAL CARE MEDICINE   Name: [name] MRN: [id number] DOB: 1928-10-03    ADMISSION DATE:  03/12/2016    PT PROFILE:   Frail 66 F with PMH of aortic stenosis, "COPD" (never smoked), DVT (s/p IVC filter placement 2013), RA admitted 05/19 to [facility] service via ED with acute on chronic respiratory failure, severe respiratory distress requiring BiPAP. Pt is DNR/DNI  MAJOR EVENTS/TEST RESULTS: 05/20 admitted with diagnoses of acute on chronic respiratory failure, likely AECOPD, concern for PE 05/20 TTE: moderate concentric hypertrophy. LVEF 55% to 60%. Severe AS. Mild MR. Mild TR. PA pressures not estimated 05/20 LE venous US: no DVT 05/20 Goals of care discussion with patient, daughter and son. DNR. Continue full medical care short of intubation or ACLS 05/21 Full dose heparin transitioned to LMWH @ DVT prophylaxis dose  INDWELLING DEVICES::   MICRO DATA: MRSA PCR 05/20 >> POS Urine 05/19 >> NEG  Resp 05/20 >>  Blood 05/19 >>    ANTIMICROBIALS:  Ceftriaxone 05/19 X 1 Azithro 05/19 X 1 Levofloxacin 05/19 >> 05/20 Doxycycline 05/21 >>    SUBJ:  Less dyspneic but still unable to come off BiPAP for more than 30 mins   PHYSICAL EXAMINATION: General: frail, dyspneic, RASS 0, + F/C Neuro: No focal deficits HEENT: NCAT Cardiovascular: Reg, + systolic M Lungs: labored when off BiPAP. Diffuse scattered coarse wheezes Abdomen: scaphoid, soft, +BS Extremities: warm, no edema  LABS:  BMET  Recent Labs Lab 03/12/16 1857 03/13/16 0453 03/14/16 0509  NA 138 136 138  K 3.7 4.1 4.7  CL 103 100* 100*  CO2 26 29 29   BUN 10 8 19   CREATININE 0.77 0.74 1.01*  GLUCOSE 174* 149* 97    Electrolytes  Recent Labs Lab 03/12/16 1857 03/13/16 0453 03/14/16 0509  CALCIUM 9.4 9.2 9.0  MG  --  1.3* 1.9  PHOS  --  3.3  --     CBC  Recent Labs Lab 03/12/16 1857 03/13/16 0453 03/14/16 0509  WBC 11.1* 10.8 10.1  HGB 12.6 12.9 12.5  HCT 37.8 37.8 37.0   PLT 144* 128* 123*    Coag's  Recent Labs Lab 03/13/16 1226  APTT 34  INR 1.02    Sepsis Markers No results for input(s): LATICACIDVEN, PROCALCITON, O2SATVEN in the last 168 hours.  ABG  Recent Labs Lab 03/12/16 2033 03/13/16 0359  PHART 7.35 7.31*  PCO2ART 54* 62*  PO2ART 80* 92    Liver Enzymes  Recent Labs Lab 03/12/16 1857 03/14/16 0509  AST 17 33  ALT 11* 18  ALKPHOS 56 52  BILITOT 0.6 0.5  ALBUMIN 3.9 3.9    Cardiac Enzymes  Recent Labs Lab 03/12/16 1857 03/13/16 0453 03/13/16 0926  TROPONINI <0.03 0.03 0.03    Glucose  Recent Labs Lab 03/13/16 0533 03/13/16 1140 03/13/16 1811 03/13/16 2023 03/13/16 2345 03/14/16 0736  GLUCAP 163* 100* 108* 92 96 92    CXR: NACPD   ASSESSMENT / PLAN: PULMONARY A: Acute on chronic respiratory failure History of COPD - never smoked. Suspect chronic obstructive asthma Acute bronchospasm H/O DVT, s/p IVC filter placement in 2013. Not on chronic anticoagulation P:   Cont PRN BiPAP Cont Supplemental O2 Cont Systemic steroids Cont Nebulized steroids and bronchodilators Doubt PE - change full dose heparin back to enoxaparin DNI  CARDIOVASCULAR A:  Severe aortic stenosis History of hypertension Sinus tachycardia - reactive P:  Monitor BP and rhythm DNR  RENAL A:   AKI (Cr 0.74 > 1.01),  oliguric P:   Monitor BMET intermittently Monitor I/Os Correct electrolytes as indicated LR @ 50 cc/hr initiated 05/21  GASTROINTESTINAL A:   History of GERD Chronic PPI use Protein-calorie malnutrition Dysphagia due to labored breathing P:   SUP: IV PPI Begin nutrition once off BiPAP  HEMATOLOGIC A:   Mild thrombocytopenia P:  DVT px: LMWH Monitor CBC intermittently Transfuse per usual guidelines   INFECTIOUS A:   Acute bronchitis P:   Monitor temp, WBC count Micro and abx as above  ENDOCRINE A:   Hypothyroidism  P:   Continue L-thyroxine  Changed to IV 05/21. Change back to  PO when able  NEUROLOGIC A:   History of CVA History of depression and anxiety P:   RASS goal: 0 Cont alprazolam, mirtazapine if/when able to take PO meds   Billy Fischer, MD PCCM service Mobile [phone] Pager [phone]   03/14/2016, 11:56 AM

## 2016-03-15 DIAGNOSIS — E440 Moderate protein-calorie malnutrition: Secondary | ICD-10-CM

## 2016-03-15 DIAGNOSIS — J441 Chronic obstructive pulmonary disease with (acute) exacerbation: Secondary | ICD-10-CM

## 2016-03-15 DIAGNOSIS — J189 Pneumonia, unspecified organism: Secondary | ICD-10-CM

## 2016-03-15 DIAGNOSIS — R0600 Dyspnea, unspecified: Secondary | ICD-10-CM

## 2016-03-15 LAB — CBC
HCT: 32.8 % — ABNORMAL LOW (ref 35.0–47.0)
Hemoglobin: 11 g/dL — ABNORMAL LOW (ref 12.0–16.0)
MCH: 34 pg (ref 26.0–34.0)
MCHC: 33.6 g/dL (ref 32.0–36.0)
MCV: 101.3 fL — ABNORMAL HIGH (ref 80.0–100.0)
PLATELETS: 116 10*3/uL — AB (ref 150–440)
RBC: 3.23 MIL/uL — AB (ref 3.80–5.20)
RDW: 13.7 % (ref 11.5–14.5)
WBC: 6.2 10*3/uL (ref 3.6–11.0)

## 2016-03-15 LAB — BLOOD GAS, ARTERIAL
ACID-BASE EXCESS: 8.3 mmol/L — AB (ref 0.0–3.0)
Allens test (pass/fail): POSITIVE — AB
BICARBONATE: 32.8 meq/L — AB (ref 21.0–28.0)
DELIVERY SYSTEMS: POSITIVE
Expiratory PAP: 5
FIO2: 0.35
Inspiratory PAP: 16
O2 Saturation: 99.3 %
PATIENT TEMPERATURE: 37
PCO2 ART: 44 mmHg (ref 32.0–48.0)
PH ART: 7.48 — AB (ref 7.350–7.450)
RATE: 10 resp/min
pO2, Arterial: 137 mmHg — ABNORMAL HIGH (ref 83.0–108.0)

## 2016-03-15 LAB — GLUCOSE, CAPILLARY
GLUCOSE-CAPILLARY: 131 mg/dL — AB (ref 65–99)
GLUCOSE-CAPILLARY: 204 mg/dL — AB (ref 65–99)
Glucose-Capillary: 106 mg/dL — ABNORMAL HIGH (ref 65–99)
Glucose-Capillary: 163 mg/dL — ABNORMAL HIGH (ref 65–99)

## 2016-03-15 LAB — BASIC METABOLIC PANEL
Anion gap: 8 (ref 5–15)
BUN: 21 mg/dL — AB (ref 6–20)
CO2: 31 mmol/L (ref 22–32)
Calcium: 8.6 mg/dL — ABNORMAL LOW (ref 8.9–10.3)
Chloride: 98 mmol/L — ABNORMAL LOW (ref 101–111)
Creatinine, Ser: 0.85 mg/dL (ref 0.44–1.00)
Glucose, Bld: 108 mg/dL — ABNORMAL HIGH (ref 65–99)
POTASSIUM: 4 mmol/L (ref 3.5–5.1)
SODIUM: 137 mmol/L (ref 135–145)

## 2016-03-15 LAB — EXPECTORATED SPUTUM ASSESSMENT W REFEX TO RESP CULTURE

## 2016-03-15 MED ORDER — SODIUM CHLORIDE 0.9 % IV SOLN
INTRAVENOUS | Status: DC
  Administered 2016-03-15 – 2016-03-17 (×3): via INTRAVENOUS

## 2016-03-15 MED ORDER — ENSURE ENLIVE PO LIQD
237.0000 mL | Freq: Three times a day (TID) | ORAL | Status: DC
  Administered 2016-03-15 – 2016-03-16 (×4): 237 mL via ORAL

## 2016-03-15 NOTE — Plan of Care (Signed)
Problem: ICU Phase Progression Outcomes Goal: Dyspnea controlled at rest Outcome: Progressing Currently no dyspnea at rest.  Tolerating ADLs and turning in bed well.  On O2 4L [place] when off BiPap for bathing and toileting, maintaining adequate SaO2 on [place].

## 2016-03-15 NOTE — Progress Notes (Signed)
PULMONARY / CRITICAL CARE MEDICINE   Name: [name] MRN: [id number] DOB: May 16, 1928    ADMISSION DATE:  03/12/2016    PT PROFILE:   Frail 48 F with PMH of aortic stenosis, "COPD" (never smoked), DVT (s/p IVC filter placement 2013), RA admitted 05/19 to [facility] service via ED with acute on chronic respiratory failure, severe respiratory distress requiring BiPAP. Pt is DNR/DNI  MAJOR EVENTS/TEST RESULTS: 05/20 admitted with diagnoses of acute on chronic respiratory failure, likely AECOPD, concern for PE 05/20 TTE: moderate concentric hypertrophy. LVEF 55% to 60%. Severe AS. Mild MR. Mild TR. PA pressures not estimated 05/20 LE venous US: no DVT 05/20 Goals of care discussion with patient, daughter and son. DNR. Continue full medical care short of intubation or ACLS 05/21 Full dose heparin transitioned to LMWH @ DVT prophylaxis dose 5/22  Still requiring bipap, intermittent episodes of confusion.   INDWELLING DEVICES::   MICRO DATA: MRSA PCR 05/20 >> POS  Urine 05/19 >> NEG  Resp 05/20 >>  Blood 05/19 >>    ANTIMICROBIALS:  Ceftriaxone 05/19 X 1 Azithro 05/19 X 1 Levofloxacin 05/19 >> 05/20 Doxycycline 05/21 >>    SUBJ:  Less dyspneic but still unable to come off BiPAP for more than 30 mins   PHYSICAL EXAMINATION: General: frail, dyspneic, RASS 0, + F/C Neuro: No focal deficits HEENT: NCAT Cardiovascular: Reg, + systolic M Lungs: labored when off BiPAP. Diffuse scattered coarse wheezes Abdomen: scaphoid, soft, +BS Extremities: warm, no edema  LABS:  BMET  Recent Labs Lab 03/13/16 0453 03/14/16 0509 03/15/16 0519  NA 136 138 137  K 4.1 4.7 4.0  CL 100* 100* 98*  CO2 29 29 31   BUN 8 19 21*  CREATININE 0.74 1.01* 0.85  GLUCOSE 149* 97 108*    Electrolytes  Recent Labs Lab 03/13/16 0453 03/14/16 0509 03/15/16 0519  CALCIUM 9.2 9.0 8.6*  MG 1.3* 1.9  --   PHOS 3.3  --   --     CBC  Recent Labs Lab 03/13/16 0453 03/14/16 0509  03/15/16 0519  WBC 10.8 10.1 6.2  HGB 12.9 12.5 11.0*  HCT 37.8 37.0 32.8*  PLT 128* 123* 116*    Coag's  Recent Labs Lab 03/13/16 1226  APTT 34  INR 1.02    Sepsis Markers No results for input(s): LATICACIDVEN, PROCALCITON, O2SATVEN in the last 168 hours.  ABG  Recent Labs Lab 03/12/16 2033 03/13/16 0359 03/15/16 0920  PHART 7.35 7.31* 7.48*  PCO2ART 54* 62* 44  PO2ART 80* 92 137*    Liver Enzymes  Recent Labs Lab 03/12/16 1857 03/14/16 0509  AST 17 33  ALT 11* 18  ALKPHOS 56 52  BILITOT 0.6 0.5  ALBUMIN 3.9 3.9    Cardiac Enzymes  Recent Labs Lab 03/12/16 1857 03/13/16 0453 03/13/16 0926  TROPONINI <0.03 0.03 0.03    Glucose  Recent Labs Lab 03/13/16 2345 03/14/16 0736 03/14/16 1159 03/14/16 1647 03/15/16 0014 03/15/16 0739  GLUCAP 96 92 99 129* 131* 106*    CXR: NACPD   ASSESSMENT / PLAN: PULMONARY A: Acute on chronic respiratory failure History of COPD - never smoked. Suspect chronic obstructive asthma Acute bronchospasm H/O DVT, s/p IVC filter placement in 2013. Not on chronic anticoagulation P:   Cont PRN BiPAP Cont Supplemental O2 Maintain O2 sat 88-94% Cont Systemic steroids Cont Nebulized steroids and bronchodilators Doubt PE - change full dose heparin back to enoxaparin DNI  CARDIOVASCULAR A:  Severe aortic stenosis History of hypertension Sinus  tachycardia - reactive P:  Monitor BP and rhythm DNR  RENAL A:   AKI (Cr 0.74 > 1.01), oliguric P:   Monitor BMET intermittently Monitor I/Os Correct electrolytes as indicated LR @ 50 cc/hr initiated 05/21  GASTROINTESTINAL A:   History of GERD Chronic PPI use Protein-calorie malnutrition Dysphagia due to labored breathing P:   SUP: IV PPI Begin nutrition once off BiPAP  HEMATOLOGIC A:   Mild thrombocytopenia P:  DVT px: LMWH Monitor CBC intermittently Transfuse per usual guidelines   INFECTIOUS A:   Acute bronchitis P:   Monitor temp, WBC  count Micro and abx as above  ENDOCRINE A:   Hypothyroidism  P:   Continue L-thyroxine  Changed to IV 05/21. Change back to PO when able  NEUROLOGIC A:   History of CVA History of depression and anxiety P:   RASS goal: 0 Cont alprazolam PRN, mirtazapine if/when able to take PO meds  I have personally obtained a history, examined the patient, evaluated laboratory and imaging results, formulated the assessment and plan and placed orders. CRITICAL CARE: The patient is critically ill with multiple organ systems failure and requires high complexity decision making for assessment and support, frequent evaluation and titration of therapies, application of advanced monitoring technologies and extensive interpretation of multiple databases. Critical Care Time devoted to patient care services described in this note is 35 minutes.    Stephanie Acre, MD [place] [facility] Pager [phone] (please enter 7-digits) On Call Pager - [phone] (please enter 7-digits)       03/15/2016, 9:48 AM

## 2016-03-16 LAB — GLUCOSE, CAPILLARY
GLUCOSE-CAPILLARY: 119 mg/dL — AB (ref 65–99)
Glucose-Capillary: 106 mg/dL — ABNORMAL HIGH (ref 65–99)
Glucose-Capillary: 176 mg/dL — ABNORMAL HIGH (ref 65–99)

## 2016-03-16 MED ORDER — PREDNISONE 20 MG PO TABS: 20.0000 mg | ORAL_TABLET | Freq: Every day | ORAL | Status: DC

## 2016-03-16 MED ORDER — PREDNISONE 5 MG PO TABS: 5.0000 mg | ORAL_TABLET | Freq: Every day | ORAL | Status: DC

## 2016-03-16 MED ORDER — PREDNISONE 10 MG PO TABS: 10.0000 mg | ORAL_TABLET | Freq: Every day | ORAL | Status: DC

## 2016-03-16 MED ORDER — PREDNISONE 20 MG PO TABS
30.0000 mg | ORAL_TABLET | Freq: Every day | ORAL | Status: AC
  Administered 2016-03-17 – 2016-03-19 (×3): 30 mg via ORAL
  Filled 2016-03-16 (×3): qty 1

## 2016-03-16 NOTE — Progress Notes (Signed)
Patient alert with times of confusion. Nasal canula with no complaints of sob. Tolerating diet and using bedpan. Patient being tx to floor.

## 2016-03-16 NOTE — Progress Notes (Signed)
PULMONARY / CRITICAL CARE MEDICINE   Name: [name] MRN: [id number] DOB: January 15, 1928    ADMISSION DATE:  03/12/2016    PT PROFILE:   Frail 6 F with PMH of aortic stenosis, "COPD" (never smoked), DVT (s/p IVC filter placement 2013), RA admitted 05/19 to [facility] service via ED with acute on chronic respiratory failure, severe respiratory distress requiring BiPAP. Pt is DNR/DNI  MAJOR EVENTS/TEST RESULTS: 05/20 admitted with diagnoses of acute on chronic respiratory failure, likely AECOPD, concern for PE 05/20 TTE: moderate concentric hypertrophy. LVEF 55% to 60%. Severe AS. Mild MR. Mild TR. PA pressures not estimated 05/20 LE venous US: no DVT 05/20 Goals of care discussion with patient, daughter and son. DNR. Continue full medical care short of intubation or ACLS 05/21 Full dose heparin transitioned to LMWH @ DVT prophylaxis dose 5/22  Still requiring bipap, intermittent episodes of confusion.  5/23  Improving respiratory status, back to [place] during the day.   INDWELLING DEVICES::   MICRO DATA: MRSA PCR 05/20 >> POS  Urine 05/19 >> NEG  Resp 05/20 >>  Blood 05/19 >>    ANTIMICROBIALS:  Ceftriaxone 05/19 X 1 Azithro 05/19 X 1 Levofloxacin 05/19 >> 05/20 Doxycycline (10 days total) 05/21 >>    SUBJ:  Less dyspneic, tolerated being off bipap most the day yesterday.  Tolerated bipap overnight.    PHYSICAL EXAMINATION: General: frail, dyspneic, RASS 0, + F/C Neuro: No focal deficits HEENT: NCAT Cardiovascular: Reg, + systolic M Lungs: improved breathing, shallow BS, no use of accessory muscles.  Abdomen: scaphoid, soft, +BS Extremities: warm, no edema  LABS:  BMET  Recent Labs Lab 03/13/16 0453 03/14/16 0509 03/15/16 0519  NA 136 138 137  K 4.1 4.7 4.0  CL 100* 100* 98*  CO2 29 29 31   BUN 8 19 21*  CREATININE 0.74 1.01* 0.85  GLUCOSE 149* 97 108*    Electrolytes  Recent Labs Lab 03/13/16 0453 03/14/16 0509 03/15/16 0519  CALCIUM 9.2 9.0 8.6*   MG 1.3* 1.9  --   PHOS 3.3  --   --     CBC  Recent Labs Lab 03/13/16 0453 03/14/16 0509 03/15/16 0519  WBC 10.8 10.1 6.2  HGB 12.9 12.5 11.0*  HCT 37.8 37.0 32.8*  PLT 128* 123* 116*    Coag's  Recent Labs Lab 03/13/16 1226  APTT 34  INR 1.02    Sepsis Markers No results for input(s): LATICACIDVEN, PROCALCITON, O2SATVEN in the last 168 hours.  ABG  Recent Labs Lab 03/12/16 2033 03/13/16 0359 03/15/16 0920  PHART 7.35 7.31* 7.48*  PCO2ART 54* 62* 44  PO2ART 80* 92 137*    Liver Enzymes  Recent Labs Lab 03/12/16 1857 03/14/16 0509  AST 17 33  ALT 11* 18  ALKPHOS 56 52  BILITOT 0.6 0.5  ALBUMIN 3.9 3.9    Cardiac Enzymes  Recent Labs Lab 03/12/16 1857 03/13/16 0453 03/13/16 0926  TROPONINI <0.03 0.03 0.03    Glucose  Recent Labs Lab 03/14/16 1647 03/15/16 0014 03/15/16 0739 03/15/16 1549 03/15/16 2349 03/16/16 0723  GLUCAP 129* 131* 106* 204* 163* 119*    CXR: NACPD   ASSESSMENT / PLAN: PULMONARY A: Acute on chronic respiratory failure History of COPD - never smoked. Suspect chronic obstructive asthma Acute bronchospasm H/O DVT, s/p IVC filter placement in 2013. Not on chronic anticoagulation P:   Cont Bipap at night, while inpt only.  Cont Supplemental O2 Maintain O2 sat 88-94% Cont Systemic steroids, transition to PO steroids (  30mg , taper over 14 days) Cont Nebulized steroids and bronchodilators Doubt PE - change full dose heparin back to enoxaparin ICS/flutter valve DNI  CARDIOVASCULAR A:  Severe aortic stenosis History of hypertension Sinus tachycardia - reactive P:  Monitor BP and rhythm DNR  RENAL A:   AKI (Cr 0.74 > 1.01), oliguric P:   Monitor BMET intermittently Monitor I/Os Correct electrolytes as indicated LR @ 50 cc/hr initiated 05/21  GASTROINTESTINAL A:   History of GERD Chronic PPI use Protein-calorie malnutrition Dysphagia due to labored breathing P:   SUP: IV PPI Begin nutrition  once off BiPAP  HEMATOLOGIC A:   Mild thrombocytopenia P:  DVT px: LMWH Monitor CBC intermittently Transfuse per usual guidelines   INFECTIOUS A:   Acute bronchitis P:   Monitor temp, WBC count Micro and abx as above  ENDOCRINE A:   Hypothyroidism  P:   Continue L-thyroxine  Changed to IV 05/21. Change back to PO when able  NEUROLOGIC A:   History of CVA History of depression and anxiety P:   RASS goal: 0 Cont alprazolam PRN, mirtazapine if/when able to take PO meds  Stable to transfer to Med-Surg floor. Spoke with Dr. 6/21, hospitalist will take over care on 5/24 AM. PCCM will follow on consults.   I have personally obtained a history, examined the patient, evaluated laboratory and imaging results, formulated the assessment and plan and placed orders. Pulmonary Care Time devoted to patient care services described in this note is 35 minutes.    6/24, MD [place] [facility] Pager [phone] (please enter 7-digits) On Call Pager - [phone] (please enter 7-digits)       03/16/2016, 11:50 AM

## 2016-03-17 LAB — CULTURE, RESPIRATORY

## 2016-03-17 LAB — CULTURE, BLOOD (ROUTINE X 2)
CULTURE: NO GROWTH
Culture: NO GROWTH

## 2016-03-17 LAB — CULTURE, RESPIRATORY W GRAM STAIN

## 2016-03-17 LAB — GLUCOSE, CAPILLARY
GLUCOSE-CAPILLARY: 109 mg/dL — AB (ref 65–99)
GLUCOSE-CAPILLARY: 199 mg/dL — AB (ref 65–99)
Glucose-Capillary: 83 mg/dL (ref 65–99)

## 2016-03-17 LAB — CREATININE, SERUM
CREATININE: 0.92 mg/dL (ref 0.44–1.00)
GFR, EST NON AFRICAN AMERICAN: 55 mL/min — AB (ref >60)

## 2016-03-17 MED ORDER — IPRATROPIUM-ALBUTEROL 0.5-2.5 (3) MG/3ML IN SOLN
3.0000 mL | Freq: Three times a day (TID) | RESPIRATORY_TRACT | Status: DC
  Administered 2016-03-18 (×3): 3 mL via RESPIRATORY_TRACT
  Filled 2016-03-17 (×5): qty 3

## 2016-03-17 MED ORDER — ONDANSETRON HCL 4 MG/2ML IJ SOLN: 4.0000 mg | Freq: Four times a day (QID) | INTRAMUSCULAR | Status: DC

## 2016-03-17 MED ORDER — BUDESONIDE 0.25 MG/2ML IN SUSP
0.2500 mg | Freq: Two times a day (BID) | RESPIRATORY_TRACT | Status: DC
  Administered 2016-03-18 (×2): 0.25 mg via RESPIRATORY_TRACT
  Filled 2016-03-17 (×3): qty 2

## 2016-03-17 MED ORDER — ENOXAPARIN SODIUM 40 MG/0.4ML ~~LOC~~ SOLN
40.0000 mg | SUBCUTANEOUS | Status: DC
  Administered 2016-03-17 – 2016-03-18 (×2): 40 mg via SUBCUTANEOUS
  Filled 2016-03-17 (×2): qty 0.4

## 2016-03-17 MED ORDER — ACETAMINOPHEN 325 MG PO TABS: 650.0000 mg | ORAL_TABLET | Freq: Four times a day (QID) | ORAL | Status: DC | PRN

## 2016-03-17 MED ORDER — SODIUM CHLORIDE 0.9 % IV SOLN
3.0000 g | Freq: Four times a day (QID) | INTRAVENOUS | Status: DC
  Administered 2016-03-17 – 2016-03-19 (×9): 3 g via INTRAVENOUS
  Filled 2016-03-17 (×12): qty 3

## 2016-03-17 MED ORDER — PANTOPRAZOLE SODIUM 40 MG PO TBEC
40.0000 mg | DELAYED_RELEASE_TABLET | Freq: Every day | ORAL | Status: DC
  Administered 2016-03-18 – 2016-03-19 (×2): 40 mg via ORAL
  Filled 2016-03-17 (×2): qty 1

## 2016-03-17 MED ORDER — ONDANSETRON HCL 4 MG PO TABS
4.0000 mg | ORAL_TABLET | Freq: Four times a day (QID) | ORAL | Status: DC
  Administered 2016-03-17 – 2016-03-18 (×4): 4 mg via ORAL
  Filled 2016-03-17 (×3): qty 1

## 2016-03-17 MED ORDER — LEVOTHYROXINE SODIUM 50 MCG PO TABS
25.0000 ug | ORAL_TABLET | Freq: Every day | ORAL | Status: DC
  Administered 2016-03-18 – 2016-03-19 (×2): 25 ug via ORAL
  Filled 2016-03-17 (×2): qty 1

## 2016-03-17 NOTE — Progress Notes (Signed)
Pt CrCl is >22ml/min. Therefore per protocol, lovenox order will be increased from 30mg  daily to 40mg  daily.  , Pharm.D Clinical Pharmacist

## 2016-03-17 NOTE — Progress Notes (Signed)
[facility] [place] at [facility]   PATIENT NAME: [name]    MR#:  [id number]  DATE OF BIRTH:  1928-08-19  SUBJECTIVE:  CHIEF COMPLAINT:  Patient is admitted to the hospital to critical care service for acute respiratory failure, patient was placed on BiPAP and antibiotics now clinically she is feeling better transferred patient to medical floor -Today during my examination patient is feeling better. Shortness of breath is significantly improved. Still coughing   REVIEW OF SYSTEMS:  CONSTITUTIONAL: No fever, fatigue  Reporting weakness.  EYES: No blurred or double vision.  EARS, NOSE, AND THROAT: No tinnitus or ear pain.  RESPIRATORY: Reports productive cough, denies  shortness of breathwhile resting ,  denies wheezing or hemoptysis.  CARDIOVASCULAR: No chest pain, orthopnea, edema.  GASTROINTESTINAL: No nausea, vomiting, diarrhea or abdominal pain.  GENITOURINARY: No dysuria, hematuria.  ENDOCRINE: No polyuria, nocturia,  HEMATOLOGY: No anemia, easy bruising or bleeding SKIN: No rash or lesion. MUSCULOSKELETAL: No joint pain or arthritis.   NEUROLOGIC: No tingling, numbness, weakness.  PSYCHIATRY: No anxiety or depression.   DRUG ALLERGIES:  No Known Allergies  VITALS:  Blood pressure 167/76, pulse 83, temperature 98.1 F (36.7 C), temperature source Oral, resp. rate 20, height 5\' 5"  (1.651 m), weight 47.129 kg (103 lb 14.4 oz), SpO2 98 %.  PHYSICAL EXAMINATION:  GENERAL:  82 y.o.-year-old patient lying in the bed with no acute distress.  EYES: Pupils equal, round, reactive to light and accommodation. No scleral icterus. Extraocular muscles intact.  HEENT: Head atraumatic, normocephalic. Oropharynx and nasopharynx clear.  NECK:  Supple, no jugular venous distention. No thyroid enlargement, no tenderness.  LUNGS: Moderate  breath sounds bilaterally, no wheezing, rales,rhonchi or crepitation. No use of accessory muscles of respiration.   CARDIOVASCULAR: S1, S2 normal. No murmurs, rubs, or gallops.  ABDOMEN: Soft, nontender, nondistended. Bowel sounds present. No organomegaly or mass.  EXTREMITIES: No pedal edema, cyanosis, or clubbing.  NEUROLOGIC: Cranial nerves II through XII are intact. Muscle strength 5/5 in all extremities. Sensation intact. Gait not checked.  PSYCHIATRIC: The patient is alert and oriented x 3.  SKIN: No obvious rash, lesion, or ulcer.    LABORATORY PANEL:   CBC  Recent Labs Lab 03/15/16 0519  WBC 6.2  HGB 11.0*  HCT 32.8*  PLT 116*   ------------------------------------------------------------------------------------------------------------------  Chemistries   Recent Labs Lab 03/14/16 0509 03/15/16 0519 03/17/16 0949  NA 138 137  --   K 4.7 4.0  --   CL 100* 98*  --   CO2 29 31  --   GLUCOSE 97 108*  --   BUN 19 21*  --   CREATININE 1.01* 0.85 0.92  CALCIUM 9.0 8.6*  --   MG 1.9  --   --   AST 33  --   --   ALT 18  --   --   ALKPHOS 52  --   --   BILITOT 0.5  --   --    ------------------------------------------------------------------------------------------------------------------  Cardiac Enzymes  Recent Labs Lab 03/13/16 0926  TROPONINI 0.03   ------------------------------------------------------------------------------------------------------------------  RADIOLOGY:  No results found.  EKG:   Orders placed or performed during the hospital encounter of 03/12/16  . ED EKG  . ED EKG    ASSESSMENT AND PLAN:    #Acute on chronic hypoxic respiratory failure secondary to COPD exacerbation and pneumonia Off BiPAP and transferred from CCU to medical floor Continue oxygen and wean off as tolerated, maintain oxygen sats from 88-94%  as recommended by pulmonology Continue steroids and wean off Discontinue doxycycline and patient is started on ampicillin sulbactam in view of sputum cultures being positive for Proteus and Klebsiella   #Sinus tachycardia is  reactive from COPD exacerbation and pneumonia Clinically improving  #Acute kidney injury resolved with IV fluids today's creatinine is at 0.92  #Essential hypertension-blood pressure is elevated titrate antihypertensives as needed  #Chronic pressure ulcer reposition patient every 2 hours   #Generalized weakness PT consult is placed for deconditioning    Provide GI and DVT prophylaxis  All the records are reviewed and case discussed with Care Management/Social Workerr. Management plans discussed with the patient, family and they are in agreement.  CODE STATUS: dnr  TOTAL TIME TAKING CARE OF THIS PATIENT: 36  minutes.   POSSIBLE D/C IN 2-3 DAYS, DEPENDING ON CLINICAL CONDITION.   Ramonita Lab M.D on 03/17/2016 at 4:27 PM  Between 7am to 6pm - Pager - [phone] After 6pm go to [email or web address] - password [email or web address] [facility]  [facility]  Office  [phone]  CC: Primary care physician; Marguarite Arbour, MD

## 2016-03-17 NOTE — Care Management Important Message (Signed)
Important Message  Patient Details  Name: [name] MRN: [id number] Date of Birth: [birth year]   Medicare Important Message Given:  Yes    Gwenette Greet, RN 03/17/2016, 11:52 AM

## 2016-03-17 NOTE — Consult Note (Signed)
Pharmacy Antibiotic Note  [name] is a 82 y.o. female admitted on 03/12/2016 with AECOPD.  Pharmacy has been consulted for amp/sul dosing.  Plan: After discussion with Dr. Amado Coe, antibiotics will be changed to amp/sul based off of cx/sens of respiratory cx.  Amp/sul 3g q 6 hours  Height: 5\' 5"  (165.1 cm) Weight: 103 lb 14.4 oz (47.129 kg) IBW/kg (Calculated) : 57  Temp (24hrs), Avg:98.3 F (36.8 C), Min:97.7 F (36.5 C), Max:98.7 F (37.1 C)   Recent Labs Lab 03/12/16 1857 03/13/16 0453 03/14/16 0509 03/15/16 0519  WBC 11.1* 10.8 10.1 6.2  CREATININE 0.77 0.74 1.01* 0.85    Estimated Creatinine Clearance: 36 mL/min (by C-G formula based on Cr of 0.85).    No Known Allergies  Antimicrobials this admission: Ceftriaxone 05/19 X 1 Azithro 05/19 X 1 Levofloxacin 05/19 >> 05/20 Doxycycline (10 days total) 05/21 >> 5/24 Unasyn 5/24>>  Dose adjustments this admission:   Microbiology results: Recent Results (from the past 240 hour(s))  Culture, blood (routine x 2)     Status: None   Collection Time: 03/12/16  6:57 PM  Result Value Ref Range Status   Specimen Description BLOOD RIGHT ASSIST CONTROL  Final   Special Requests BOTTLES DRAWN AEROBIC AND ANAEROBIC 3 CC  Final   Culture NO GROWTH 5 DAYS  Final   Report Status 03/17/2016 FINAL  Final  Culture, blood (routine x 2)     Status: None   Collection Time: 03/12/16  7:12 PM  Result Value Ref Range Status   Specimen Description BLOOD LEFT ASSIST CONTROL  Final   Special Requests BOTTLES DRAWN AEROBIC AND ANAEROBIC 1 CC  Final   Culture NO GROWTH 5 DAYS  Final   Report Status 03/17/2016 FINAL  Final  Urine culture     Status: None   Collection Time: 03/12/16  9:35 PM  Result Value Ref Range Status   Specimen Description URINE, CLEAN CATCH  Final   Special Requests NONE  Final   Culture NO GROWTH Performed at [facility]   Final   Report Status 03/14/2016 FINAL  Final  MRSA PCR Screening      Status: Abnormal   Collection Time: 03/13/16  1:56 AM  Result Value Ref Range Status   MRSA by PCR POSITIVE (A) NEGATIVE Final    Comment:        The GeneXpert MRSA Assay (FDA approved for NASAL specimens only), is one component of a comprehensive MRSA colonization surveillance program. It is not intended to diagnose MRSA infection nor to guide or monitor treatment for MRSA infections. CRITICAL RESULT CALLED TO, READ BACK BY AND VERIFIED WITH: EMMA EGWUATU AT 0321 ON 03/13/16 RWW   Culture, expectorated sputum-assessment     Status: None   Collection Time: 03/14/16  3:50 AM  Result Value Ref Range Status   Specimen Description EXPECTORATED SPUTUM  Final   Special Requests NONE  Final   Sputum evaluation THIS SPECIMEN IS ACCEPTABLE FOR SPUTUM CULTURE  Final   Report Status 03/15/2016 FINAL  Final  Culture, respiratory (NON-Expectorated)     Status: None   Collection Time: 03/14/16  3:50 AM  Result Value Ref Range Status   Specimen Description EXPECTORATED SPUTUM  Final   Special Requests NONE Reflexed from 03/16/16  Final   Gram Stain   Final    MODERATE WBC SEEN RARE GRAM POSITIVE COCCI IN PAIRS FAIR SPECIMEN - 70-80% WBCS    Culture   Final    LIGHT  GROWTH KLEBSIELLA PNEUMONIAE LIGHT GROWTH PROTEUS MIRABILIS LIGHT GROWTH Consistent with normal respiratory flora.    Report Status 03/17/2016 FINAL  Final   Organism ID, Bacteria KLEBSIELLA PNEUMONIAE  Final   Organism ID, Bacteria PROTEUS MIRABILIS  Final      Susceptibility   Klebsiella pneumoniae - MIC*    AMPICILLIN >=32 RESISTANT Resistant     CEFAZOLIN <=4 SENSITIVE Sensitive     CEFEPIME <=1 SENSITIVE Sensitive     CEFTAZIDIME <=1 SENSITIVE Sensitive     CEFTRIAXONE <=1 SENSITIVE Sensitive     CIPROFLOXACIN <=0.25 SENSITIVE Sensitive     GENTAMICIN <=1 SENSITIVE Sensitive     IMIPENEM <=0.25 SENSITIVE Sensitive     TRIMETH/SULFA <=20 SENSITIVE Sensitive     AMPICILLIN/SULBACTAM 4 SENSITIVE Sensitive     PIP/TAZO  <=4 SENSITIVE Sensitive     Extended ESBL NEGATIVE Sensitive     * LIGHT GROWTH KLEBSIELLA PNEUMONIAE   Proteus mirabilis - MIC*    AMPICILLIN >=32 RESISTANT Resistant     CEFAZOLIN 8 SENSITIVE Sensitive     CEFEPIME <=1 SENSITIVE Sensitive     CEFTAZIDIME <=1 SENSITIVE Sensitive     CEFTRIAXONE <=1 SENSITIVE Sensitive     CIPROFLOXACIN 2 INTERMEDIATE Intermediate     GENTAMICIN <=1 SENSITIVE Sensitive     IMIPENEM 8 INTERMEDIATE Intermediate     TRIMETH/SULFA >=320 RESISTANT Resistant     AMPICILLIN/SULBACTAM <=2 SENSITIVE Sensitive     PIP/TAZO <=4 SENSITIVE Sensitive     * LIGHT GROWTH PROTEUS MIRABILIS     Thank you for allowing pharmacy to be a part of this patient's care.  Olene Floss, Pharm.D Clinical Pharmacist   03/17/2016 9:26 AM

## 2016-03-18 LAB — GLUCOSE, CAPILLARY
GLUCOSE-CAPILLARY: 129 mg/dL — AB (ref 65–99)
GLUCOSE-CAPILLARY: 131 mg/dL — AB (ref 65–99)
GLUCOSE-CAPILLARY: 195 mg/dL — AB (ref 65–99)
Glucose-Capillary: 82 mg/dL (ref 65–99)

## 2016-03-18 LAB — CBC
HEMATOCRIT: 37.7 % (ref 35.0–47.0)
Hemoglobin: 12.9 g/dL (ref 12.0–16.0)
MCH: 33.8 pg (ref 26.0–34.0)
MCHC: 34.2 g/dL (ref 32.0–36.0)
MCV: 98.9 fL (ref 80.0–100.0)
Platelets: 128 10*3/uL — ABNORMAL LOW (ref 150–440)
RBC: 3.81 MIL/uL (ref 3.80–5.20)
RDW: 13.6 % (ref 11.5–14.5)
WBC: 6.7 10*3/uL (ref 3.6–11.0)

## 2016-03-18 LAB — BASIC METABOLIC PANEL
Anion gap: 7 (ref 5–15)
BUN: 25 mg/dL — AB (ref 6–20)
CHLORIDE: 99 mmol/L — AB (ref 101–111)
CO2: 37 mmol/L — AB (ref 22–32)
CREATININE: 0.92 mg/dL (ref 0.44–1.00)
Calcium: 8.7 mg/dL — ABNORMAL LOW (ref 8.9–10.3)
GFR, EST NON AFRICAN AMERICAN: 55 mL/min — AB (ref >60)
Glucose, Bld: 98 mg/dL (ref 65–99)
Potassium: 3.4 mmol/L — ABNORMAL LOW (ref 3.5–5.1)
Sodium: 143 mmol/L (ref 135–145)

## 2016-03-18 MED ORDER — ONDANSETRON HCL 4 MG PO TABS: 4.0000 mg | ORAL_TABLET | Freq: Three times a day (TID) | ORAL | Status: DC | PRN

## 2016-03-18 MED ORDER — POTASSIUM CHLORIDE CRYS ER 20 MEQ PO TBCR
20.0000 meq | EXTENDED_RELEASE_TABLET | Freq: Once | ORAL | Status: AC
  Administered 2016-03-18: 16:00:00 20 meq via ORAL
  Filled 2016-03-18: qty 1

## 2016-03-18 MED ORDER — BISACODYL 10 MG RE SUPP
10.0000 mg | Freq: Every day | RECTAL | Status: DC
  Administered 2016-03-18: 16:00:00 10 mg via RECTAL
  Filled 2016-03-18: qty 1

## 2016-03-18 MED ORDER — ONDANSETRON HCL 4 MG/2ML IJ SOLN: 4.0000 mg | Freq: Three times a day (TID) | INTRAMUSCULAR | Status: DC | PRN

## 2016-03-18 MED ORDER — LOSARTAN POTASSIUM 25 MG PO TABS
25.0000 mg | ORAL_TABLET | Freq: Every day | ORAL | Status: DC
Start: 2016-03-18 — End: 2016-03-19
  Administered 2016-03-18 – 2016-03-19 (×2): 25 mg via ORAL
  Filled 2016-03-18 (×2): qty 1

## 2016-03-18 NOTE — Care Management (Signed)
Spoke with daughter, Antonieta Pert, [phone] or [phone]). States she will be going to stay with her husband this morning, he is having a cardiac cath this morning.  Discussed that her mother will have a physical therapy evaluation this morning. Would like her mother to come back to her home, if possible with [facility] in place. Last was at [facility] September-October 2016. [facility] was in [facility] after discharging from [facility].  Gwenette Greet RN MSN CCM Care Management [phone]

## 2016-03-18 NOTE — Progress Notes (Signed)
[facility] - [place] at [facility]   PATIENT NAME: [name]    MR#:  [id number]  DATE OF BIRTH:  [birth year]  SUBJECTIVE:  CHIEF COMPLAINT:  Patient is admitted to the hospital to critical care service for acute respiratory failure, patient was placed on BiPAP and antibiotics now clinically she is feeling better transferred patient to medical floor -Today during my examination patients shortness of breath is significantly improved. Still coughingAnd feeling weak   REVIEW OF SYSTEMS:  CONSTITUTIONAL: No fever, fatigue  Reporting weakness.  EYES: No blurred or double vision.  EARS, NOSE, AND THROAT: No tinnitus or ear pain.  RESPIRATORY: Reports productive cough, denies  shortness of breathwhile resting ,  denies wheezing or hemoptysis.  CARDIOVASCULAR: No chest pain, orthopnea, edema.  GASTROINTESTINAL: No nausea, vomiting, diarrhea or abdominal pain.  GENITOURINARY: No dysuria, hematuria.  ENDOCRINE: No polyuria, nocturia,  HEMATOLOGY: No anemia, easy bruising or bleeding SKIN: No rash or lesion. MUSCULOSKELETAL: No joint pain or arthritis.   NEUROLOGIC: No tingling, numbness, weakness.  PSYCHIATRY: No anxiety or depression.   DRUG ALLERGIES:  No Known Allergies  VITALS:  Blood pressure 162/70, pulse 85, temperature 97.7 F (36.5 C), temperature source Oral, resp. rate 20, height 5\' 5"  (1.651 m), weight 47.174 kg (104 lb), SpO2 96 %.  PHYSICAL EXAMINATION:  GENERAL:  82 y.o.-year-old patient lying in the bed with no acute distress.  EYES: Pupils equal, round, reactive to light and accommodation. No scleral icterus. Extraocular muscles intact.  HEENT: Head atraumatic, normocephalic. Oropharynx and nasopharynx clear.  NECK:  Supple, no jugular venous distention. No thyroid enlargement, no tenderness.  LUNGS: Moderate  breath sounds bilaterally, no wheezing, rales,rhonchi or crepitation. No use of accessory muscles of respiration.  CARDIOVASCULAR:  S1, S2 normal. No murmurs, rubs, or gallops.  ABDOMEN: Soft, nontender, nondistended. Bowel sounds present. No organomegaly or mass.  EXTREMITIES: No pedal edema, cyanosis, or clubbing.  NEUROLOGIC: Cranial nerves II through XII are intact. Muscle strength 5/5 in all extremities. Sensation intact. Gait not checked.  PSYCHIATRIC: The patient is alert and oriented x 3.  SKIN: No obvious rash, lesion, or ulcer.    LABORATORY PANEL:   CBC  Recent Labs Lab 03/18/16 0321  WBC 6.7  HGB 12.9  HCT 37.7  PLT 128*   ------------------------------------------------------------------------------------------------------------------  Chemistries   Recent Labs Lab 03/14/16 0509  03/18/16 0321  NA 138  < > 143  K 4.7  < > 3.4*  CL 100*  < > 99*  CO2 29  < > 37*  GLUCOSE 97  < > 98  BUN 19  < > 25*  CREATININE 1.01*  < > 0.92  CALCIUM 9.0  < > 8.7*  MG 1.9  --   --   AST 33  --   --   ALT 18  --   --   ALKPHOS 52  --   --   BILITOT 0.5  --   --   < > = values in this interval not displayed. ------------------------------------------------------------------------------------------------------------------  Cardiac Enzymes  Recent Labs Lab 03/13/16 0926  TROPONINI 0.03   ------------------------------------------------------------------------------------------------------------------  RADIOLOGY:  No results found.  EKG:   Orders placed or performed during the hospital encounter of 03/12/16  . ED EKG  . ED EKG    ASSESSMENT AND PLAN:    #Acute on chronic hypoxic respiratory failure secondary to COPD exacerbation and pneumoniaWith Proteus and Klebsiella Off BiPAP and transferred from CCU to medical floor Continue oxygen and  wean off as tolerated, maintain oxygen sats from 88-94% as recommended by pulmonology Continue steroids and wean off Discontinue doxycycline and patient is started on ampicillin sulbactam in view of sputum cultures being positive for Proteus and  Klebsiella   #Sinus tachycardia is reactive from COPD exacerbation and pneumonia Clinically improving  #Acute kidney injury resolved with IV fluids today's creatinine is at 0.92  #Essential hypertension-blood pressure is elevated start patient on losartan and titrate as needed  #Chronic pressure ulcer reposition patient every 2 hours   #Generalized weakness PT consult is placed for deconditioning  #Constipation provided laxatives    Provide GI and DVT prophylaxis  All the records are reviewed and case discussed with Care Management/Social Workerr. Management plans discussed with the patient, family and they are in agreement.  CODE STATUS: dnr  TOTAL TIME TAKING CARE OF THIS PATIENT: 34  minutes.   POSSIBLE D/C IN am  DAYS, DEPENDING ON CLINICAL CONDITION.   Ramonita Lab M.D on 03/18/2016 at 3:41 PM  Between 7am to 6pm - Pager - [phone] After 6pm go to [email or web address] - password [email or web address] [facility]  [facility]  Office  [phone]  CC: Primary care physician; Marguarite Arbour, MD

## 2016-03-18 NOTE — Evaluation (Signed)
Physical Therapy Evaluation Patient Details Name: [name] MRN: [id number] DOB: 04/23/1928 Today's Date: 03/18/2016   History of Present Illness  History is obtained from ED records as patient is on continuous BiPAP and unable to speak clearly. This is an 82 year old Caucasian female with a past medical history of anxiety, hypertension, CVA, aortic stenosis, COPD, bronchitis, hypothyroidism and anemia who presents with shortness of breath. Based on ED records, EMS was called because patient was noted to have progressive shortness of breath. Upon EMS arrival, patient was hypoxic and was 1 nebulizer treatment nebulizer treatment and placed on oxygen. Upon arrival in the ED, her O2 saturation was 89% on 4 L. Patient had a fever and cough for the last 2-3 days. She reports persistent dyspnea despite treatments receiving the ED. She is now on continuous BiPAP. She denies chest pain, abdominal pain, headache, dizziness, but reports cough with mild sputum production, fever and nausea. She was febrile in the ED, but remained tachycardic with heart rate in the 120s. Her chest x-ray shows no infiltrates, but shows emphysematous changes consistent with COPD. She is not on home oxygen. She is on Spiriva and prednisone and reports taking all medications as prescribed.  Clinical Impression  Pt is very weak and initially resistant to physical therapy evaluation. She eventually agrees with encouragement from daughter on the phone. She is able to take short shuffling steps from bed to recliner but demonstrates significant trunk lean and LE buckling. Recommend DC to SNF but pt and daughter refuse as they would like for her to return home with [facility] PT. Please arrange HH PT. Pt should use RW at all times. Pt will benefit from skilled PT services to address deficits in strength, balance, and mobility in order to return to full function at home.      Follow Up Recommendations SNF;Other (comment) (Pt and dtr refuse,  please arrange [facility] PT)    Equipment Recommendations  None recommended by PT    Recommendations for Other Services       Precautions / Restrictions Precautions Precautions: Fall Restrictions Weight Bearing Restrictions: No      Mobility  Bed Mobility Overal bed mobility: Needs Assistance Bed Mobility: Supine to Sit     Supine to sit: Min assist     General bed mobility comments: Pt requires minA for supine to sit with assist to scoot toward EOB  Transfers Overall transfer level: Needs assistance Equipment used: Rolling walker (2 wheeled) Transfers: Sit to/from Stand Sit to Stand: Min guard         General transfer comment: Pt with incrased time required to come to standing due to LE weakness. Once upright requires UE support to remain standing  Ambulation/Gait Ambulation/Gait assistance: Min guard Ambulation Distance (Feet): 3 Feet Assistive device: Rolling walker (2 wheeled) Gait Pattern/deviations: Decreased step length - right;Decreased step length - left;Shuffle Gait velocity: Decreased Gait velocity interpretation: <1.8 ft/sec, indicative of risk for recurrent falls General Gait Details: Pt ambulates from bed to recliner with short shuffling steps. Crouched posture with forward trunk leaning and buckling LE. SaO2 drops to 80% on room air and does not recover until supplemental O2 donned. 2L/min provided and SaO2 increases to 95% after 60 seconds  Stairs            Wheelchair Mobility    Modified Rankin (Stroke Patients Only)       Balance Overall balance assessment: Needs assistance Sitting-balance support: No upper extremity supported Sitting balance-Leahy Scale: Fair  Standing balance support: Bilateral upper extremity supported Standing balance-Leahy Scale: Poor Standing balance comment: Pt requires heavy UE support for balance                             Pertinent Vitals/Pain Pain Assessment: No/denies pain    Home  Living Family/patient expects to be discharged to:: Private residence Living Arrangements: Children Available Help at Discharge: Family Type of Home: House Home Access: Ramped entrance     Home Layout: One level Home Equipment: Environmental consultant - 2 wheels;Bedside commode;Wheelchair - manual;Hospital bed Additional Comments: Family provides 24/7 supervision/care as needed    Prior Function Level of Independence: Needs assistance   Gait / Transfers Assistance Needed: SBA using RW for longer distances in home. Pt uses wheelchair for community mobility.  ADL's / Homemaking Assistance Needed: sponge baths; assist for meals; cleaning; laundry  Comments: would need assistance with bathing, however is mod I with dressing and bed mobility/transfers; used RW when inside home. Daughter would use wheelchair when patient left the home;      Hand Dominance   Dominant Hand: Right    Extremity/Trunk Assessment   Upper Extremity Assessment: Generalized weakness           Lower Extremity Assessment: Generalized weakness         Communication   Communication: HOH  Cognition Arousal/Alertness: Awake/alert Behavior During Therapy: WFL for tasks assessed/performed Overall Cognitive Status: Within Functional Limits for tasks assessed                      General Comments      Exercises        Assessment/Plan    PT Assessment Patient needs continued PT services  PT Diagnosis Difficulty walking;Abnormality of gait;Generalized weakness   PT Problem List Decreased strength;Decreased activity tolerance;Decreased balance;Decreased mobility;Decreased safety awareness;Cardiopulmonary status limiting activity  PT Treatment Interventions DME instruction;Gait training;Functional mobility training;Therapeutic activities;Therapeutic exercise;Balance training;Neuromuscular re-education;Patient/family education;Manual techniques   PT Goals (Current goals can be found in the Care Plan section)  Acute Rehab PT Goals Patient Stated Goal: Return to prior level of function at home PT Goal Formulation: With patient Time For Goal Achievement: 04/01/16 Potential to Achieve Goals: Good    Frequency Min 2X/week   Barriers to discharge        Co-evaluation               End of Session Equipment Utilized During Treatment: Gait belt;Oxygen Activity Tolerance: Patient limited by fatigue Patient left: in chair;with call bell/phone within reach;with chair alarm set Nurse Communication: Mobility status         Time: 1540-1600 PT Time Calculation (min) (ACUTE ONLY): 20 min   Charges:   PT Evaluation $PT Eval Moderate Complexity: 1 Procedure     PT G Codes:       Sharalyn Ink Matheu Ploeger PT, DPT   Clarisse Rodriges 03/18/2016, 3:48 PM

## 2016-03-19 DIAGNOSIS — I350 Nonrheumatic aortic (valve) stenosis: Secondary | ICD-10-CM

## 2016-03-19 DIAGNOSIS — I517 Cardiomegaly: Secondary | ICD-10-CM

## 2016-03-19 DIAGNOSIS — I10 Essential (primary) hypertension: Secondary | ICD-10-CM

## 2016-03-19 DIAGNOSIS — K5900 Constipation, unspecified: Secondary | ICD-10-CM

## 2016-03-19 DIAGNOSIS — N179 Acute kidney failure, unspecified: Secondary | ICD-10-CM

## 2016-03-19 DIAGNOSIS — J209 Acute bronchitis, unspecified: Secondary | ICD-10-CM

## 2016-03-19 DIAGNOSIS — I340 Nonrheumatic mitral (valve) insufficiency: Secondary | ICD-10-CM

## 2016-03-19 DIAGNOSIS — B961 Klebsiella pneumoniae [K. pneumoniae] as the cause of diseases classified elsewhere: Secondary | ICD-10-CM

## 2016-03-19 DIAGNOSIS — A498 Other bacterial infections of unspecified site: Secondary | ICD-10-CM

## 2016-03-19 DIAGNOSIS — R000 Tachycardia, unspecified: Secondary | ICD-10-CM

## 2016-03-19 LAB — GLUCOSE, CAPILLARY
GLUCOSE-CAPILLARY: 164 mg/dL — AB (ref 65–99)
Glucose-Capillary: 89 mg/dL (ref 65–99)
Glucose-Capillary: 149 mg/dL — ABNORMAL HIGH (ref 65–99)

## 2016-03-19 MED ORDER — PREDNISONE 10 MG (21) PO TBPK: 10.0000 mg | ORAL_TABLET | Freq: Every day | ORAL | Status: DC

## 2016-03-19 MED ORDER — AMOXICILLIN-POT CLAVULANATE 250-62.5 MG/5ML PO SUSR: 500.0000 mg | Freq: Two times a day (BID) | ORAL | Status: DC

## 2016-03-19 MED ORDER — LOSARTAN POTASSIUM 25 MG PO TABS: 25.0000 mg | ORAL_TABLET | Freq: Every day | ORAL | Status: AC

## 2016-03-19 MED ORDER — CETYLPYRIDINIUM CHLORIDE 0.05 % MT LIQD: 7.0000 mL | Freq: Two times a day (BID) | OROMUCOSAL | Status: AC

## 2016-03-19 MED ORDER — BUDESONIDE 0.25 MG/2ML IN SUSP: 0.2500 mg | Freq: Two times a day (BID) | RESPIRATORY_TRACT | Status: AC

## 2016-03-19 NOTE — Care Management (Signed)
Discharge to home today per Dr. Winona Legato. Will be followed by [facility] for nursing and physical therapy appointments. Feliberto Gottron, [facility] representative updated. Family will transport.  Gwenette Greet RN MSN Care Management [phone]

## 2016-03-19 NOTE — Progress Notes (Signed)
SATURATION QUALIFICATIONS: (This note is used to comply with regulatory documentation for home oxygen)  Patient Saturations on Room Air at Rest = 93%  Patient Saturations on Room Air while Ambulating = 95%   

## 2016-03-19 NOTE — Plan of Care (Signed)
Problem: Activity: Goal: Risk for activity intolerance will decrease Outcome: Not Progressing Pt continues to use bedpan, refuses to get up to [facility]. States she is afraid of falling.

## 2016-03-19 NOTE — Clinical Social Work Note (Signed)
Pt will discharge home with home health arranged by [facility]. PT recommended SNF, which was declined by pt and daughter. CSW is signing off as no further needs identified.   Dede Query, MSW, LCSW  Clinical Social Worker  [phone]

## 2016-03-19 NOTE — Care Management Important Message (Signed)
Important Message  Patient Details  Name: [name] MRN: [id number] Date of Birth: [birth year]   Medicare Important Message Given:  Yes    Gwenette Greet, RN 03/19/2016, 1:16 PM

## 2016-03-19 NOTE — Discharge Instructions (Signed)
Respiratory failure is when your lungs are not working well and your breathing (respiratory) system fails. When respiratory failure occurs, it is difficult for your lungs to get enough oxygen, get rid of carbon dioxide, or both. Respiratory failure can be life threatening.  °Respiratory failure can be acute or chronic. Acute respiratory failure is sudden, severe, and requires emergency medical treatment. Chronic respiratory failure is less severe, happens over time, and requires ongoing treatment.  °WHAT ARE THE CAUSES OF ACUTE RESPIRATORY FAILURE?  °Any problem affecting the heart or lungs can cause acute respiratory failure. Some of these causes include the following: °· Chronic bronchitis and emphysema (COPD).   °· Blood clot going to a lung (pulmonary embolism).   °· Having water in the lungs caused by heart failure, lung injury, or infection (pulmonary edema).   °· Collapsed lung (pneumothorax).   °· Pneumonia.   °· Pulmonary fibrosis.   °· Obesity.   °· Asthma.   °· Heart failure.   °· Any type of trauma to the chest that can make breathing difficult.   °· Nerve or muscle diseases making chest movements difficult. °HOW WILL MY ACUTE RESPIRATORY FAILURE BE TREATED?  °Treatment of acute respiratory failure depends on the cause of the respiratory failure. Usually, you will stay in the intensive care unit so your breathing can be watched closely. Treatment can include the following: °· Oxygen. Oxygen can be delivered through the following: °¨ Nasal cannula. This is small tubing that goes in your nose to give you oxygen. °¨ Face mask. A face mask covers your nose and mouth to give you oxygen. °· Medicine. Different medicines can be given to help with breathing. These can include: °¨ Nebulizers. Nebulizers deliver medicines to open the air passages (bronchodilators). These medicines help to open or relax the airways in the lungs so you can breathe better. They can also help loosen mucus from your  lungs. °¨ Diuretics. Diuretic medicines can help you breathe better by getting rid of extra water in your body. °¨ Steroids. Steroid medicines can help decrease swelling (inflammation) in your lungs. °¨ Antibiotics. °· Chest tube. If you have a collapsed lung (pneumothorax), a chest tube is placed to help reinflate the lung. °· Noninvasive positive pressure ventilation (NPPV). This is a tight-fitting mask that goes over your nose and mouth. The mask has tubing that is attached to a machine. The machine blows air into the tubing, which helps to keep the tiny air sacs (alveoli) in your lungs open. This machine allows you to breathe on your own. °· Ventilator. A ventilator is a breathing machine. When on a ventilator, a breathing tube is put into the lungs. A ventilator is used when you can no longer breathe well enough on your own. You may have low oxygen levels or high carbon dioxide (CO2) levels in your blood. When you are on a ventilator, sedation and pain medicines are given to make you sleep so your lungs can heal. °SEEK IMMEDIATE MEDICAL CARE IF: °· You have shortness of breath (dyspnea) with or without activity. °· You have rapid breathing (tachypnea). °· You are wheezing. °· You are unable to say more than a few words without having to catch your breath. °· You find it very difficult to function normally. °· You have a fast heart rate. °· You have a bluish color to your finger or toe nail beds. °· You have confusion or drowsiness or both. °  °This information is not intended to replace advice given to you by your health care provider. Make sure you discuss   any questions you have with your health care provider. °  °Document Released: 10/16/2013 Document Revised: 07/02/2015 Document Reviewed: 10/16/2013 °Elsevier Interactive Patient Education ©2016 [employer] ° °

## 2016-03-19 NOTE — Discharge Summary (Signed)
[facility] [place] at [facility]   PATIENT NAME: [name]    MR#:  [id number]  DATE OF BIRTH:  [birth year]  DATE OF ADMISSION:  03/12/2016 ADMITTING PHYSICIAN: No admitting provider for patient encounter.  DATE OF DISCHARGE: No discharge date for patient encounter.  PRIMARY CARE PHYSICIAN: [name],[name], MD     ADMISSION DIAGNOSIS:  Cough [R05] Respiratory distress [R06.00] Chronic obstructive pulmonary disease, unspecified COPD type (HCC) [J44.9]  DISCHARGE DIAGNOSIS:  Principal Problem:   Acute respiratory failure (HCC) Active Problems:   Acute bronchitis   Klebsiella pneumoniae infection   Proteus mirabilis infection   Pressure ulcer   Generalized weakness   Malnutrition of moderate degree   Acute renal failure (HCC)   Sinus tachycardia (HCC)   Essential hypertension   Severe aortic stenosis   Constipation   LVH (left ventricular hypertrophy)   Moderate mitral regurgitation   SECONDARY DIAGNOSIS:   Past Medical History  Diagnosis Date  . Anxiety   . HTN (hypertension)   . Stroke (HCC)   . Depression   . Aortic stenosis   . ASD (atrial septal defect)   . Osteoporosis, postmenopausal   . AVM (arteriovenous malformation)     pulmonary, s/p cautery  . Anxiety   . RA (rheumatoid arthritis) (HCC)   . Chronic obstruct airways disease (HCC)   . Bronchitis   . Anemia   . Hypothyroidism   . Heart murmur     .pro HOSPITAL COURSE:   Patient is 82 year old Caucasian female with past medical history significant for history of anxiety, stroke, hypertension, aortic stenosis, COPD, who presents to the hospital with complaints of shortness of breath and wheezing. Apparently EMS was called. This patient has been progressively short of breath and hypoxic with oxygen saturations of 89% on 4 L of oxygen, she complained of fever and cough. She was noted to be tachycardic with heart rate of 120. Chest x-ray revealed COPD with no acute  infiltrate. Doppler ultrasound of lower extremities showed no DVT. Patient was initiated on BiPAP, antibiotic therapy, steroids, inhalers and her condition improved, she was being weaned off oxygen.Marland Kitchen She was seen and followed up pulmonologist/intensivist while in the hospital. Echocardiogram was performed revealing moderate concentric LVH, severe aortic stenosis, mild MR. Patient was seen by physical therapist and recommended skilled nursing facility placement, however, daughter and the patient refused, home health services are being arranged for her upon discharge. Discussion by problem #Acute on chronic hypoxic respiratory failure secondary to COPD exacerbation and acute bronchitis versus pneumonia, although pneumonia is not seen on x-rays, due to Proteus and Klebsiella, cultured from sputum. Patient was managed on BiPAP initially, improved and transferred from CCU to medical floor. The patient is being weaned off oxygen as tolerated, keeping O2 sats at 88%-94%. Patient is to continue taper steroids. Patient is to continue Augmentin for 10 day course. As mentioned above, patient's sputum cultures grew Proteus mirabilis and Klebsiella pneumonia, both sensitive to ampicillin/ sulbactam  #Sinus tachycardia , likely reactive due to COPD exacerbation and acute bronchitis, heart rate has improved to 70s by the day of discharge.   #Acute kidney injury resolved with IV fluids,  today's creatinine is at 0.92  #Essential hypertension-blood pressure is elevated , continue patient on losartan and titrate as needed  #Chronic pressure ulcer reposition patient every 2 hours   #Generalized weakness PT consult is placed for deconditioning. We will arrange home health services, although she was recommended to go to skilled nursing facility  by physical therapist, refused.  #Constipation provided laxatives  DISCHARGE CONDITIONS:  Stable   CONSULTS OBTAINED:  Treatment Team:  Ramonita Lab, MD Mertie Moores,  MD  DRUG ALLERGIES:  No Known Allergies  DISCHARGE MEDICATIONS:   Current Discharge Medication List    START taking these medications   Details  amoxicillin-clavulanate (AUGMENTIN) 250-62.5 MG/5ML suspension Take 10 mLs (500 mg total) by mouth 2 (two) times daily. Qty: 200 mL, Refills: 0    antiseptic oral rinse (CPC / CETYLPYRIDINIUM CHLORIDE 0.05%) 0.05 % LIQD solution 7 mLs by Mouth Rinse route 2 times daily at 12 noon and 4 pm. Qty: 354 mL, Refills: 4    budesonide (PULMICORT) 0.25 MG/2ML nebulizer solution Take 2 mLs (0.25 mg total) by nebulization 2 (two) times daily. Qty: 60 mL, Refills: 12    losartan (COZAAR) 25 MG tablet Take 1 tablet (25 mg total) by mouth daily. Qty: 30 tablet, Refills: 5      CONTINUE these medications which have NOT CHANGED   Details  acetaminophen (TYLENOL) 325 MG tablet Take 2 tablets (650 mg total) by mouth every 6 (six) hours as needed for mild pain (or Fever >/= 101). Qty: 100 tablet, Refills: 0    ALPRAZolam (XANAX) 0.25 MG tablet Take 1 tablet (0.25 mg total) by mouth 2 (two) times daily as needed for anxiety. Qty: 30 tablet, Refills: 0    aspirin EC 81 MG EC tablet Take 1 tablet (81 mg total) by mouth daily. Qty: 100 tablet, Refills: 0    Calcium Carbonate-Vitamin D (CALCIUM-VITAMIN D) 500-200 MG-UNIT per tablet Take 1 tablet by mouth daily.    Cholecalciferol (VITAMIN D-3) 1000 UNITS CAPS Take 1 capsule by mouth daily.    feeding supplement, ENSURE ENLIVE, (ENSURE ENLIVE) LIQD Take 237 mLs by mouth daily. Qty: 237 mL, Refills: 12    ipratropium-albuterol (DUONEB) 0.5-2.5 (3) MG/3ML SOLN Take 3 mLs by nebulization 4 (four) times daily. Qty: 360 mL, Refills: 5    levothyroxine (SYNTHROID, LEVOTHROID) 25 MCG tablet Take 25 mcg by mouth daily before breakfast.    metoCLOPramide (REGLAN) 5 MG tablet Take 5 mg by mouth 3 (three) times daily before meals.    mirtazapine (REMERON) 30 MG tablet Take 30 mg by mouth at bedtime.     Multiple Vitamin (MULTIVITAMIN WITH MINERALS) TABS tablet Take 1 tablet by mouth daily.    pantoprazole (PROTONIX) 40 MG tablet Take 40 mg by mouth 2 (two) times daily.    SPIRIVA HANDIHALER 18 MCG inhalation capsule Place 1 capsule into inhaler and inhale daily.    guaiFENesin (MUCINEX) 600 MG 12 hr tablet Take 1 tablet (600 mg total) by mouth 2 (two) times daily. Qty: 60 tablet, Refills: 5    ondansetron (ZOFRAN) 4 MG tablet Take 1 tablet (4 mg total) by mouth every 6 (six) hours as needed for nausea. Qty: 20 tablet, Refills: 0    polyethylene glycol (MIRALAX / GLYCOLAX) packet Take 17 g by mouth daily as needed for mild constipation. Qty: 14 each, Refills: 0    senna-docusate (SENOKOT-S) 8.6-50 MG tablet Take 2 tablets by mouth daily.      STOP taking these medications     cefUROXime (CEFTIN) 250 MG tablet      predniSONE (DELTASONE) 10 MG tablet      azithromycin (ZITHROMAX) 250 MG tablet          DISCHARGE INSTRUCTIONS:    Patient is to follow-up with primary care physician and cardiologist, she would benefit from  pulmonary follow-up as well  If you experience worsening of your admission symptoms, develop shortness of breath, life threatening emergency, suicidal or homicidal thoughts you must seek medical attention immediately by calling 911 or calling your MD immediately  if symptoms less severe.  You Must read complete instructions/literature along with all the possible adverse reactions/side effects for all the Medicines you take and that have been prescribed to you. Take any new Medicines after you have completely understood and accept all the possible adverse reactions/side effects.   Please note  You were cared for by a hospitalist during your hospital stay. If you have any questions about your discharge medications or the care you received while you were in the hospital after you are discharged, you can call the unit and asked to speak with the hospitalist on  call if the hospitalist that took care of you is not available. Once you are discharged, your primary care physician will handle any further medical issues. Please note that NO REFILLS for any discharge medications will be authorized once you are discharged, as it is imperative that you return to your primary care physician (or establish a relationship with a primary care physician if you do not have one) for your aftercare needs so that they can reassess your need for medications and monitor your lab values.    Today   CHIEF COMPLAINT:   Chief Complaint  Patient presents with  . Shortness of Breath    HISTORY OF PRESENT ILLNESS:  Ramonita Cherek  is a 82 y.o. female with a known history of anxiety, stroke, hypertension, aortic stenosis, COPD, who presents to the hospital with complaints of shortness of breath and wheezing. Apparently EMS was called. This patient has been progressively short of breath and hypoxic with oxygen saturations of 89% on 4 L of oxygen, she complained of fever and cough. She was noted to be tachycardic with heart rate of 120. Chest x-ray revealed COPD with no acute infiltrate. Doppler ultrasound of lower extremities showed no DVT. Patient was initiated on BiPAP, antibiotic therapy, steroids, inhalers and her condition improved, she was being weaned off oxygen.Marland Kitchen She was seen and followed up pulmonologist/intensivist while in the hospital. Echocardiogram was performed revealing moderate concentric LVH, severe aortic stenosis, mild MR. Patient was seen by physical therapist and recommended skilled nursing facility placement, however, daughter and the patient refused, home health services are being arranged for her upon discharge. Discussion by problem #Acute on chronic hypoxic respiratory failure secondary to COPD exacerbation and acute bronchitis versus pneumonia, although pneumonia is not seen on x-rays, due to Proteus and Klebsiella, cultured from sputum. Patient was managed  on BiPAP initially, improved and transferred from CCU to medical floor. The patient is being weaned off oxygen as tolerated, keeping O2 sats at 88%-94%. Patient is to continue taper steroids. Patient is to continue Augmentin for 10 day course. As mentioned above, patient's sputum cultures grew Proteus mirabilis and Klebsiella pneumonia, both sensitive to ampicillin/ sulbactam  #Sinus tachycardia , likely reactive due to COPD exacerbation and acute bronchitis, heart rate has improved to 70s by the day of discharge.   #Acute kidney injury resolved with IV fluids,  today's creatinine is at 0.92  #Essential hypertension-blood pressure is elevated , continue patient on losartan and titrate as needed  #Chronic pressure ulcer reposition patient every 2 hours   #Generalized weakness PT consult is placed for deconditioning. We will arrange home health services, although she was recommended to go to skilled nursing facility by  physical therapist, refused.  #Constipation provided laxatives     VITAL SIGNS:  Blood pressure 147/74, pulse 79, temperature 97.6 F (36.4 C), temperature source Oral, resp. rate 20, height  (1.651 m), weight 49.896 kg (110 lb), SpO2 99 %.  I/O:   Intake/Output Summary (Last 24 hours) at 03/19/16 0930 Last data filed at 03/19/16 0534  Gross per 24 hour  Intake      0 ml  Output    200 ml  Net   -200 ml    PHYSICAL EXAMINATION:  GENERAL:  82 y.o.-year-old patient lying in the bed with no acute distress.  EYES: Pupils equal, round, reactive to light and accommodation. No scleral icterus. Extraocular muscles intact.  HEENT: Head atraumatic, normocephalic. Oropharynx and nasopharynx clear.  NECK:  Supple, no jugular venous distention. No thyroid enlargement, no tenderness.  LUNGS: Few rhonchi in lungs, normal breath sounds bilaterally, no wheezing, rales,rhonchi or crepitation. No use of accessory muscles of respiration.  CARDIOVASCULAR: S1, S2 normal. 4 out of 5  systolic murmur, radiating to axilla, no rubs, or gallops.  ABDOMEN: Soft, non-tender, non-distended. Bowel sounds present. No organomegaly or mass.  EXTREMITIES: No pedal edema, cyanosis, or clubbing.  NEUROLOGIC: Cranial nerves II through XII are intact. Muscle strength 5/5 in all extremities. Sensation intact. Gait not checked.  PSYCHIATRIC: The patient is alert and oriented x 3.  SKIN: No obvious rash, lesion, or ulcer.   DATA REVIEW:   CBC  Recent Labs Lab 03/18/16 0321  WBC 6.7  HGB 12.9  HCT 37.7  PLT 128*    Chemistries   Recent Labs Lab 03/14/16 0509  03/18/16 0321  NA 138  < > 143  K 4.7  < > 3.4*  CL 100*  < > 99*  CO2 29  < > 37*  GLUCOSE 97  < > 98  BUN 19  < > 25*  CREATININE 1.01*  < > 0.92  CALCIUM 9.0  < > 8.7*  MG 1.9  --   --   AST 33  --   --   ALT 18  --   --   ALKPHOS 52  --   --   BILITOT 0.5  --   --   < > = values in this interval not displayed.  Cardiac Enzymes  Recent Labs Lab 03/13/16 0926  TROPONINI 0.03    Microbiology Results  Results for orders placed or performed during the hospital encounter of 03/12/16  Culture, blood (routine x 2)     Status: None   Collection Time: 03/12/16  6:57 PM  Result Value Ref Range Status   Specimen Description BLOOD RIGHT ASSIST CONTROL  Final   Special Requests BOTTLES DRAWN AEROBIC AND ANAEROBIC 3 CC  Final   Culture NO GROWTH 5 DAYS  Final   Report Status 03/17/2016 FINAL  Final  Culture, blood (routine x 2)     Status: None   Collection Time: 03/12/16  7:12 PM  Result Value Ref Range Status   Specimen Description BLOOD LEFT ASSIST CONTROL  Final   Special Requests BOTTLES DRAWN AEROBIC AND ANAEROBIC 1 CC  Final   Culture NO GROWTH 5 DAYS  Final   Report Status 03/17/2016 FINAL  Final  Urine culture     Status: None   Collection Time: 03/12/16  9:35 PM  Result Value Ref Range Status   Specimen Description URINE, CLEAN CATCH  Final   Special Requests NONE  Final   Culture NO  GROWTH Performed at [facility]   Final   Report Status 03/14/2016 FINAL  Final  MRSA PCR Screening     Status: Abnormal   Collection Time: 03/13/16  1:56 AM  Result Value Ref Range Status   MRSA by PCR POSITIVE (A) NEGATIVE Final    Comment:        The GeneXpert MRSA Assay (FDA approved for NASAL specimens only), is one component of a comprehensive MRSA colonization surveillance program. It is not intended to diagnose MRSA infection nor to guide or monitor treatment for MRSA infections. CRITICAL RESULT CALLED TO, READ BACK BY AND VERIFIED WITH: EMMA EGWUATU AT 0321 ON 03/13/16 RWW   Culture, expectorated sputum-assessment     Status: None   Collection Time: 03/14/16  3:50 AM  Result Value Ref Range Status   Specimen Description EXPECTORATED SPUTUM  Final   Special Requests NONE  Final   Sputum evaluation THIS SPECIMEN IS ACCEPTABLE FOR SPUTUM CULTURE  Final   Report Status 03/15/2016 FINAL  Final  Culture, respiratory (NON-Expectorated)     Status: None   Collection Time: 03/14/16  3:50 AM  Result Value Ref Range Status   Specimen Description EXPECTORATED SPUTUM  Final   Special Requests NONE Reflexed from [id number]  Final   Gram Stain   Final    MODERATE WBC SEEN RARE GRAM POSITIVE COCCI IN PAIRS FAIR SPECIMEN - 70-80% WBCS    Culture   Final    LIGHT GROWTH KLEBSIELLA PNEUMONIAE LIGHT GROWTH PROTEUS MIRABILIS LIGHT GROWTH Consistent with normal respiratory flora.    Report Status 03/17/2016 FINAL  Final   Organism ID, Bacteria KLEBSIELLA PNEUMONIAE  Final   Organism ID, Bacteria PROTEUS MIRABILIS  Final      Susceptibility   Klebsiella pneumoniae - MIC*    AMPICILLIN >=32 RESISTANT Resistant     CEFAZOLIN <=4 SENSITIVE Sensitive     CEFEPIME <=1 SENSITIVE Sensitive     CEFTAZIDIME <=1 SENSITIVE Sensitive     CEFTRIAXONE <=1 SENSITIVE Sensitive     CIPROFLOXACIN <=0.25 SENSITIVE Sensitive     GENTAMICIN <=1 SENSITIVE Sensitive     IMIPENEM <=0.25 SENSITIVE  Sensitive     TRIMETH/SULFA <=20 SENSITIVE Sensitive     AMPICILLIN/SULBACTAM 4 SENSITIVE Sensitive     PIP/TAZO <=4 SENSITIVE Sensitive     Extended ESBL NEGATIVE Sensitive     * LIGHT GROWTH KLEBSIELLA PNEUMONIAE   Proteus mirabilis - MIC*    AMPICILLIN >=32 RESISTANT Resistant     CEFAZOLIN 8 SENSITIVE Sensitive     CEFEPIME <=1 SENSITIVE Sensitive     CEFTAZIDIME <=1 SENSITIVE Sensitive     CEFTRIAXONE <=1 SENSITIVE Sensitive     CIPROFLOXACIN 2 INTERMEDIATE Intermediate     GENTAMICIN <=1 SENSITIVE Sensitive     IMIPENEM 8 INTERMEDIATE Intermediate     TRIMETH/SULFA >=320 RESISTANT Resistant     AMPICILLIN/SULBACTAM <=2 SENSITIVE Sensitive     PIP/TAZO <=4 SENSITIVE Sensitive     * LIGHT GROWTH PROTEUS MIRABILIS    RADIOLOGY:  No results found.  EKG:   Orders placed or performed during the hospital encounter of 03/12/16  . ED EKG  . ED EKG      Management plans discussed with the patient, family and they are in agreement.  CODE STATUS:     Code Status Orders        Start     Ordered   03/13/16 1416  Do not attempt resuscitation (DNR)   Continuous    Question  Answer Comment  In the event of cardiac or respiratory ARREST Do not call a "code blue"   In the event of cardiac or respiratory ARREST Do not perform Intubation, CPR, defibrillation or ACLS   In the event of cardiac or respiratory ARREST Use medication by any route, position, wound care, and other measures to relive pain and suffering. May use oxygen, suction and manual treatment of airway obstruction as needed for comfort.      03/13/16 1415    Code Status History    Date Active Date Inactive Code Status Order ID Comments User Context   03/12/2016  9:59 PM 03/13/2016  2:15 PM Full Code [id number]  Lewie Loron, NP ED   07/10/2015  5:43 AM 07/11/2015  9:35 PM Full Code [id number]  Crissie Figures, MD Inpatient   04/27/2015  8:17 PM 04/29/2015  5:07 PM Full Code [id number]  Wyatt Haste, MD ED       TOTAL TIME TAKING CARE OF THIS PATIENT: 40 minutes.    Katharina Caper M.D on 03/19/2016 at 9:30 AM  Between 7am to 6pm - Pager - [phone]  After 6pm go to [email or web address] - password [email or web address] [facility]  [facility] [place] [facility]  Office  [phone]  CC: Primary care physician; Marguarite Arbour, MD

## 2016-03-19 NOTE — Care Management (Signed)
Discussed home health agencies with daughter. Chose [facility]. Declines skilled nursing facility at this time. Possible discharge today per Dr. Winona Legato. Gwenette Greet RN MSN CCM Care Management [phone]

## 2016-03-19 NOTE — Progress Notes (Signed)
Pt is alert and oriented x 4, family at bedside, good appetite, up in chair at bedside, denies pain, weaned to room air with good 02 saturation, pt is d/c to home with home health, [facility] will contact patient at home. Daughter at bedside during discharge, verbalizes understanding of d/c instuctions and has no further questions at this time, Rx provided to daughter, all other rx sent to pharmacy.  Pushed to visitor entrance via volunteer shift.

## 2016-03-19 NOTE — Progress Notes (Signed)
FSBS 89 per NT Debbie

## 2016-03-20 IMAGING — RF DG ESOPHAGUS
6 series · 11 of 11 positions shown · non-contrast
Comparison: None in PACs

CLINICAL DATA: Gastroesophageal reflux with increasing symptoms
over the past several months, occasional episodes of choking while
eating, sensation of food coming back up following a swallow. The
patient is unable to stand.

EXAM:
ESOPHOGRAM/BARIUM SWALLOW
TECHNIQUE: Single contrast examination was performed using thin barium or water
soluble. The barium tablet was administered.
FLUOROSCOPY TIME:  Fluoroscopy Time:  1 minutes, 0 seconds
Number of Acquired Images:  9

[Series 1: fluoro_barium 2fps_bw · 0.20mm/px · 1 of 1 slices shown (1 of 6)]
[im 1/1]
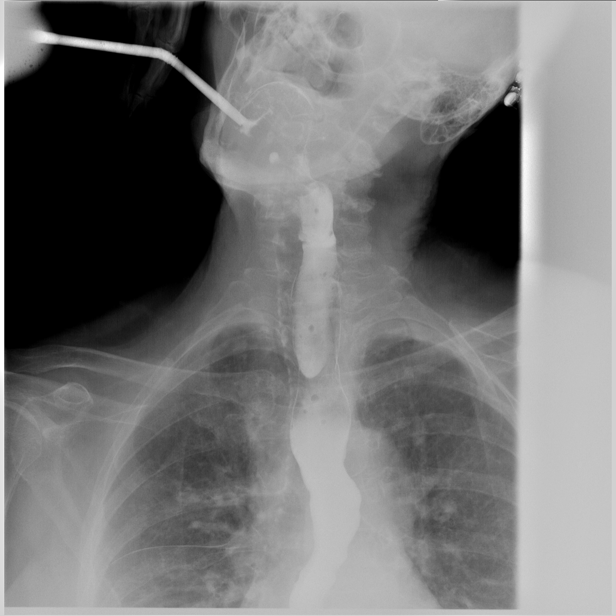

[Series 2: fluoro_barium 2fps_bw · 0.20mm/px · 1 of 1 slices shown (2 of 6)]
[im 1/1]
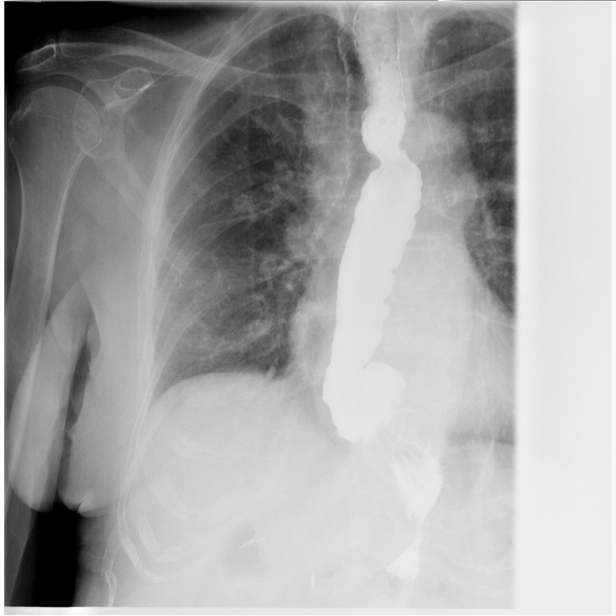

[Series 3: fluoro_barium 2fps_bw · 0.20mm/px · 1 of 1 slices shown (3 of 6)]
[im 1/1]
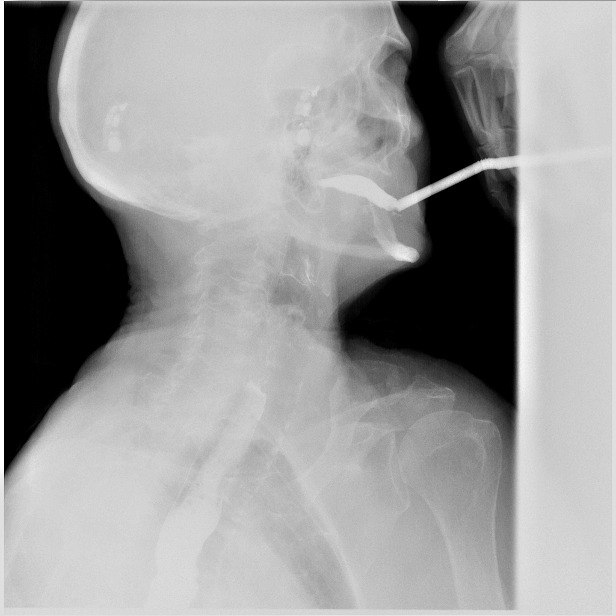

[Series 4: fluoro_barium 2fps_bw · 0.20mm/px · 3 of 9 frames shown (4 of 6)]
[frame 2/9]
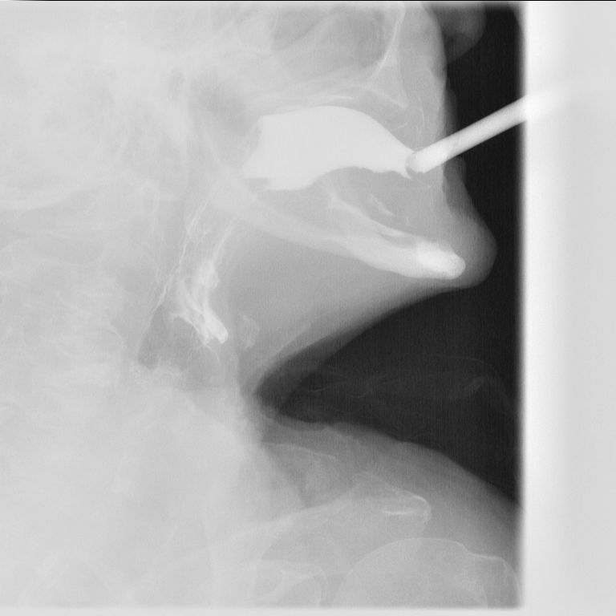
[frame 5/9]
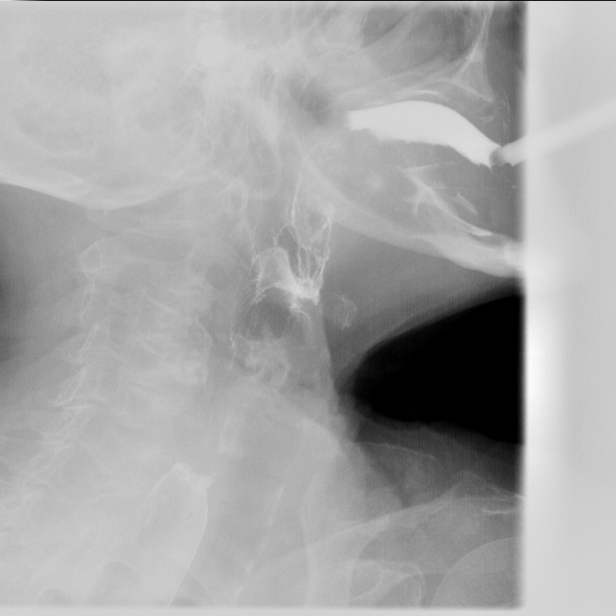
[frame 8/9]
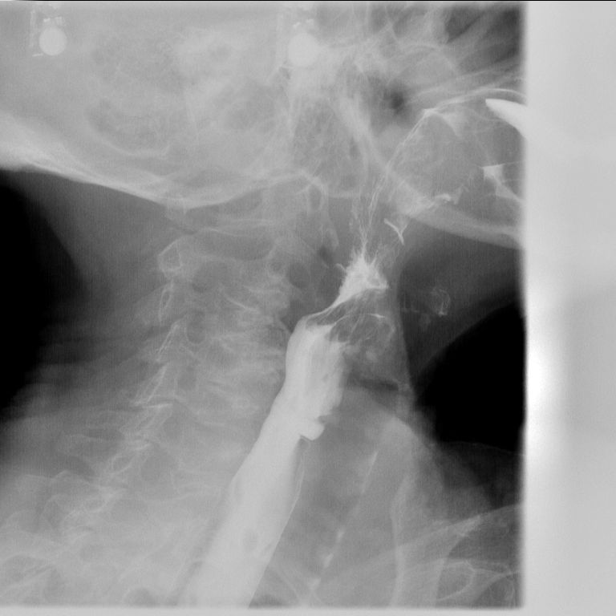

[Series 5: fluoro_barium 2fps_bw · 0.20mm/px · 1 of 1 slices shown (5 of 6)]
[im 1/1]
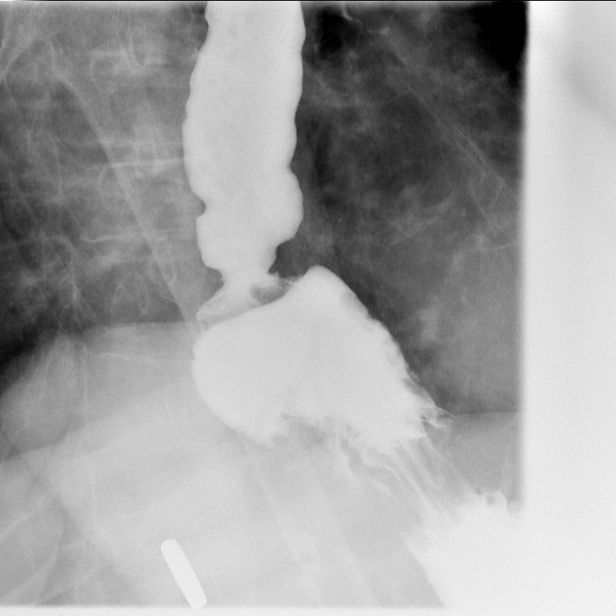

[Series 6: fluoro_barium 2fps_bw · 0.20mm/px · 4 of 4 slices shown (6 of 6)]
[im 1/4]
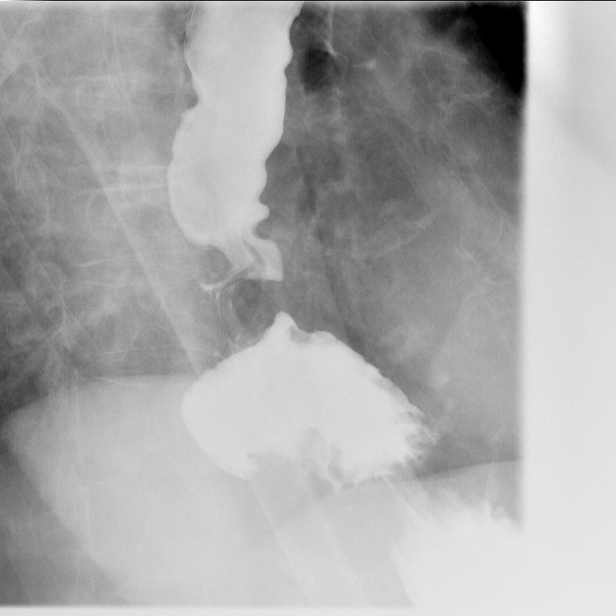
[im 2/4]
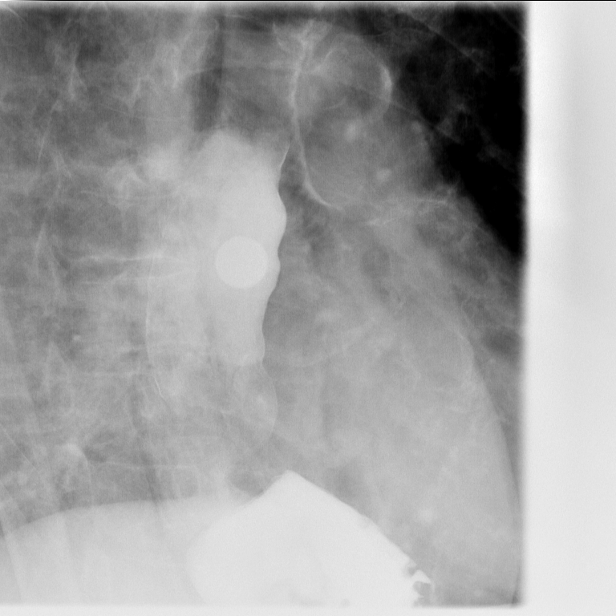
[im 3/4]
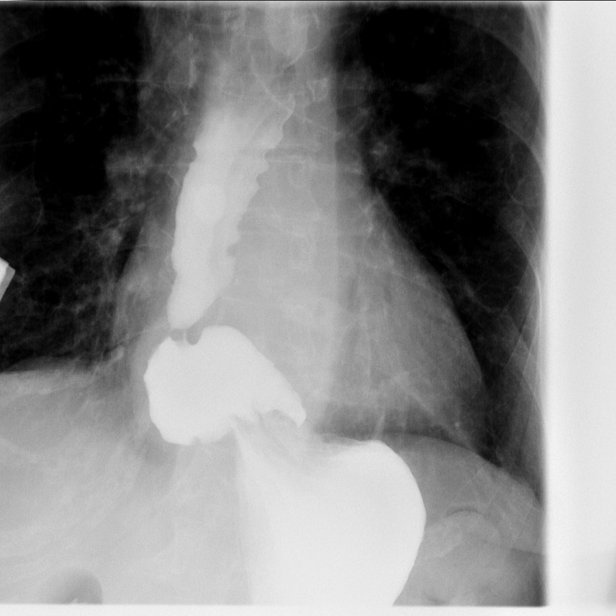
[im 4/4]
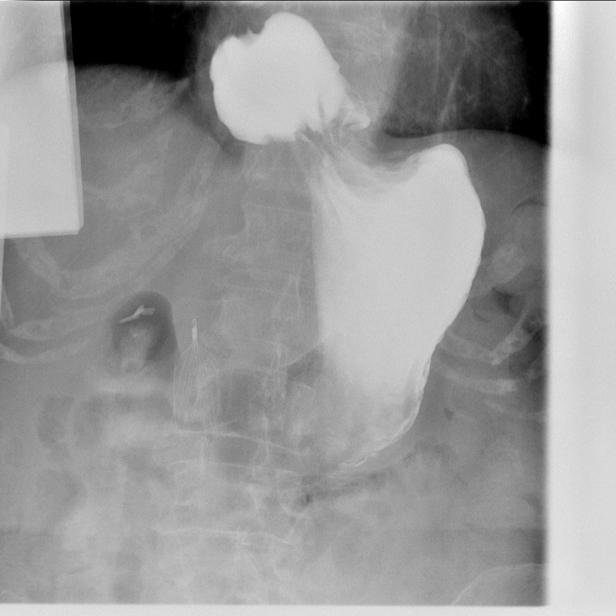

[11 of 11 positions shown; findings below may reference images not displayed]

FINDINGS: The patient ingested thin barium without difficulty. There was no
laryngeal penetration of the barium. The cervical esophagus
distended well.

The thoracic esophagus exhibited marked changes of presbyesophagus
with numerous tertiary contractions. No fixed stricture in the upper
and midesophagus was observed. An area of narrowing in the distal
esophagus just proximal to a moderate-sized incompletely reducible
hiatal hernia was observed. The barium tablet hung in this region.
The visualized portions of the stomach below the diaphragm are
unremarkable.
IMPRESSION: 1. There are marked changes of presbyesophagus. Gastroesophageal
reflux was observed to the level of the cervical esophagus without
evidence of laryngeal penetration.
2. Circumferentially narrowed portion of the distal esophagus just
proximal to the junction with the hiatal hernia. This would not
allow passage of the barium tablet.

## 2018-02-11 ENCOUNTER — Encounter: Payer: Self-pay | Admitting: Emergency Medicine

## 2018-02-11 ENCOUNTER — Inpatient Hospital Stay
Admission: EM | Admit: 2018-02-11 | Discharge: 2018-02-14 | DRG: 291 | Disposition: A | Discharge status: Hospice | Payer: Medicare Other | Attending: Internal Medicine | Admitting: Internal Medicine

## 2018-02-11 ENCOUNTER — Other Ambulatory Visit: Payer: Self-pay

## 2018-02-11 ENCOUNTER — Emergency Department: Payer: Medicare Other

## 2018-02-11 DIAGNOSIS — Z8249 Family history of ischemic heart disease and other diseases of the circulatory system: Secondary | ICD-10-CM | POA: Diagnosis not present

## 2018-02-11 DIAGNOSIS — Z79890 Hormone replacement therapy (postmenopausal): Secondary | ICD-10-CM | POA: Diagnosis not present

## 2018-02-11 DIAGNOSIS — M810 Age-related osteoporosis without current pathological fracture: Secondary | ICD-10-CM | POA: Diagnosis present

## 2018-02-11 DIAGNOSIS — Z7982 Long term (current) use of aspirin: Secondary | ICD-10-CM

## 2018-02-11 DIAGNOSIS — E039 Hypothyroidism, unspecified: Secondary | ICD-10-CM | POA: Diagnosis present

## 2018-02-11 DIAGNOSIS — Z90710 Acquired absence of both cervix and uterus: Secondary | ICD-10-CM | POA: Diagnosis not present

## 2018-02-11 DIAGNOSIS — Z66 Do not resuscitate: Secondary | ICD-10-CM | POA: Diagnosis present

## 2018-02-11 DIAGNOSIS — Q211 Atrial septal defect: Secondary | ICD-10-CM

## 2018-02-11 DIAGNOSIS — Z8673 Personal history of transient ischemic attack (TIA), and cerebral infarction without residual deficits: Secondary | ICD-10-CM

## 2018-02-11 DIAGNOSIS — I5033 Acute on chronic diastolic (congestive) heart failure: Secondary | ICD-10-CM | POA: Diagnosis present

## 2018-02-11 DIAGNOSIS — I110 Hypertensive heart disease with heart failure: Secondary | ICD-10-CM | POA: Diagnosis not present

## 2018-02-11 DIAGNOSIS — J441 Chronic obstructive pulmonary disease with (acute) exacerbation: Secondary | ICD-10-CM | POA: Diagnosis present

## 2018-02-11 DIAGNOSIS — Z79899 Other long term (current) drug therapy: Secondary | ICD-10-CM

## 2018-02-11 DIAGNOSIS — Z801 Family history of malignant neoplasm of trachea, bronchus and lung: Secondary | ICD-10-CM | POA: Diagnosis not present

## 2018-02-11 DIAGNOSIS — Z9049 Acquired absence of other specified parts of digestive tract: Secondary | ICD-10-CM

## 2018-02-11 DIAGNOSIS — Q2730 Arteriovenous malformation, site unspecified: Secondary | ICD-10-CM

## 2018-02-11 DIAGNOSIS — E876 Hypokalemia: Secondary | ICD-10-CM | POA: Diagnosis present

## 2018-02-11 DIAGNOSIS — M069 Rheumatoid arthritis, unspecified: Secondary | ICD-10-CM | POA: Diagnosis present

## 2018-02-11 DIAGNOSIS — I080 Rheumatic disorders of both mitral and aortic valves: Secondary | ICD-10-CM | POA: Diagnosis present

## 2018-02-11 DIAGNOSIS — H9190 Unspecified hearing loss, unspecified ear: Secondary | ICD-10-CM | POA: Diagnosis present

## 2018-02-11 DIAGNOSIS — Z823 Family history of stroke: Secondary | ICD-10-CM | POA: Diagnosis not present

## 2018-02-11 DIAGNOSIS — F419 Anxiety disorder, unspecified: Secondary | ICD-10-CM | POA: Diagnosis present

## 2018-02-11 DIAGNOSIS — J810 Acute pulmonary edema: Secondary | ICD-10-CM

## 2018-02-11 DIAGNOSIS — J9601 Acute respiratory failure with hypoxia: Secondary | ICD-10-CM | POA: Diagnosis present

## 2018-02-11 DIAGNOSIS — I5031 Acute diastolic (congestive) heart failure: Secondary | ICD-10-CM

## 2018-02-11 DIAGNOSIS — I509 Heart failure, unspecified: Secondary | ICD-10-CM

## 2018-02-11 HISTORY — DX: Acute diastolic (congestive) heart failure: I50.31

## 2018-02-11 LAB — CBC WITH DIFFERENTIAL/PLATELET
BASOS PCT: 1 %
Basophils Absolute: 0.1 10*3/uL (ref 0–0.1)
Eosinophils Absolute: 0.8 10*3/uL — ABNORMAL HIGH (ref 0–0.7)
Eosinophils Relative: 15 %
HEMATOCRIT: 35.7 % (ref 35.0–47.0)
Hemoglobin: 12.2 g/dL (ref 12.0–16.0)
LYMPHS PCT: 28 %
Lymphs Abs: 1.5 10*3/uL (ref 1.0–3.6)
MCH: 37.2 pg — ABNORMAL HIGH (ref 26.0–34.0)
MCHC: 34.2 g/dL (ref 32.0–36.0)
MCV: 108.8 fL — AB (ref 80.0–100.0)
Monocytes Absolute: 0.2 10*3/uL (ref 0.2–0.9)
Monocytes Relative: 4 %
NEUTROS ABS: 2.8 10*3/uL (ref 1.4–6.5)
NEUTROS PCT: 52 %
Platelets: 140 10*3/uL — ABNORMAL LOW (ref 150–440)
RBC: 3.28 MIL/uL — AB (ref 3.80–5.20)
RDW: 13.2 % (ref 11.5–14.5)
WBC: 5.4 10*3/uL (ref 3.6–11.0)

## 2018-02-11 LAB — BRAIN NATRIURETIC PEPTIDE: B Natriuretic Peptide: 1658 pg/mL — ABNORMAL HIGH (ref 0.0–100.0)

## 2018-02-11 LAB — COMPREHENSIVE METABOLIC PANEL WITH GFR
ALT: 20 U/L (ref 14–54)
AST: 22 U/L (ref 15–41)
Albumin: 3.7 g/dL (ref 3.5–5.0)
Alkaline Phosphatase: 66 U/L (ref 38–126)
Anion gap: 5 (ref 5–15)
BUN: 12 mg/dL (ref 6–20)
CO2: 32 mmol/L (ref 22–32)
Calcium: 8.6 mg/dL — ABNORMAL LOW (ref 8.9–10.3)
Chloride: 97 mmol/L — ABNORMAL LOW (ref 101–111)
Creatinine, Ser: 0.8 mg/dL (ref 0.44–1.00)
GFR calc Af Amer: >60 mL/min
GFR calc non Af Amer: >60 mL/min
Glucose, Bld: 138 mg/dL — ABNORMAL HIGH (ref 65–99)
Potassium: 3.6 mmol/L (ref 3.5–5.1)
Sodium: 134 mmol/L — ABNORMAL LOW (ref 135–145)
Total Bilirubin: 0.6 mg/dL (ref 0.3–1.2)
Total Protein: 6.3 g/dL — ABNORMAL LOW (ref 6.5–8.1)

## 2018-02-11 LAB — MAGNESIUM: Magnesium: 2 mg/dL (ref 1.7–2.4)

## 2018-02-11 LAB — TROPONIN I

## 2018-02-11 MED ORDER — VITAMIN D 1000 UNITS PO TABS
1000.0000 [IU] | ORAL_TABLET | Freq: Every day | ORAL | Status: DC
  Administered 2018-02-12 – 2018-02-14 (×3): 1000 [IU] via ORAL
  Filled 2018-02-11 (×3): qty 1

## 2018-02-11 MED ORDER — POTASSIUM CHLORIDE CRYS ER 10 MEQ PO TBCR
10.0000 meq | EXTENDED_RELEASE_TABLET | Freq: Every day | ORAL | Status: DC
  Administered 2018-02-11 – 2018-02-14 (×4): 10 meq via ORAL
  Filled 2018-02-11 (×4): qty 1

## 2018-02-11 MED ORDER — ALPRAZOLAM 0.25 MG PO TABS
0.2500 mg | ORAL_TABLET | Freq: Two times a day (BID) | ORAL | Status: DC | PRN
  Administered 2018-02-13: 0.25 mg via ORAL
  Filled 2018-02-11: qty 1

## 2018-02-11 MED ORDER — ENSURE ENLIVE PO LIQD
237.0000 mL | ORAL | Status: DC
  Administered 2018-02-11 – 2018-02-13 (×2): 237 mL via ORAL

## 2018-02-11 MED ORDER — ENOXAPARIN SODIUM 40 MG/0.4ML ~~LOC~~ SOLN
40.0000 mg | SUBCUTANEOUS | Status: DC
  Administered 2018-02-11 – 2018-02-12 (×2): 40 mg via SUBCUTANEOUS
  Filled 2018-02-11 (×2): qty 0.4

## 2018-02-11 MED ORDER — SENNOSIDES-DOCUSATE SODIUM 8.6-50 MG PO TABS
2.0000 | ORAL_TABLET | Freq: Every day | ORAL | Status: DC
  Administered 2018-02-11 – 2018-02-14 (×3): 2 via ORAL
  Filled 2018-02-11 (×3): qty 2

## 2018-02-11 MED ORDER — FUROSEMIDE 10 MG/ML IJ SOLN
20.0000 mg | Freq: Once | INTRAMUSCULAR | Status: AC
  Administered 2018-02-11: 20 mg via INTRAVENOUS
  Filled 2018-02-11: qty 4

## 2018-02-11 MED ORDER — FUROSEMIDE 10 MG/ML IJ SOLN
20.0000 mg | Freq: Two times a day (BID) | INTRAMUSCULAR | Status: DC
  Administered 2018-02-11 – 2018-02-13 (×4): 20 mg via INTRAVENOUS
  Filled 2018-02-11 (×4): qty 2

## 2018-02-11 MED ORDER — HYDROCODONE-ACETAMINOPHEN 5-325 MG PO TABS: 1.0000 | ORAL_TABLET | ORAL | Status: DC | PRN

## 2018-02-11 MED ORDER — METHYLPREDNISOLONE SODIUM SUCC 125 MG IJ SOLR
125.0000 mg | Freq: Once | INTRAMUSCULAR | Status: AC
  Administered 2018-02-11: 125 mg via INTRAVENOUS

## 2018-02-11 MED ORDER — ACETAMINOPHEN 325 MG PO TABS
650.0000 mg | ORAL_TABLET | Freq: Four times a day (QID) | ORAL | Status: DC | PRN
  Administered 2018-02-13: 650 mg via ORAL
  Filled 2018-02-11: qty 2

## 2018-02-11 MED ORDER — PANTOPRAZOLE SODIUM 40 MG PO TBEC
40.0000 mg | DELAYED_RELEASE_TABLET | Freq: Two times a day (BID) | ORAL | Status: DC
  Administered 2018-02-11 – 2018-02-14 (×6): 40 mg via ORAL
  Filled 2018-02-11 (×7): qty 1

## 2018-02-11 MED ORDER — TIOTROPIUM BROMIDE MONOHYDRATE 18 MCG IN CAPS
1.0000 | ORAL_CAPSULE | Freq: Every day | RESPIRATORY_TRACT | Status: DC
  Administered 2018-02-12 – 2018-02-14 (×3): 18 ug via RESPIRATORY_TRACT
  Filled 2018-02-11: qty 5

## 2018-02-11 MED ORDER — MIRTAZAPINE 15 MG PO TABS
30.0000 mg | ORAL_TABLET | Freq: Every day | ORAL | Status: DC
  Administered 2018-02-11 – 2018-02-13 (×3): 30 mg via ORAL
  Filled 2018-02-11 (×3): qty 2

## 2018-02-11 MED ORDER — ACETAMINOPHEN 650 MG RE SUPP: 650.0000 mg | Freq: Four times a day (QID) | RECTAL | Status: DC | PRN

## 2018-02-11 MED ORDER — KETOROLAC TROMETHAMINE 15 MG/ML IJ SOLN: 15.0000 mg | Freq: Four times a day (QID) | INTRAMUSCULAR | Status: DC | PRN

## 2018-02-11 MED ORDER — METHYLPREDNISOLONE SODIUM SUCC 125 MG IJ SOLR
INTRAMUSCULAR | Status: AC
  Administered 2018-02-11: 125 mg via INTRAVENOUS
  Filled 2018-02-11: qty 2

## 2018-02-11 MED ORDER — ONDANSETRON HCL 4 MG PO TABS: 4.0000 mg | ORAL_TABLET | Freq: Four times a day (QID) | ORAL | Status: DC | PRN

## 2018-02-11 MED ORDER — ALBUTEROL SULFATE (2.5 MG/3ML) 0.083% IN NEBU: 2.5000 mg | INHALATION_SOLUTION | RESPIRATORY_TRACT | Status: DC | PRN

## 2018-02-11 MED ORDER — SODIUM CHLORIDE 0.9% FLUSH: 3.0000 mL | INTRAVENOUS | Status: DC | PRN

## 2018-02-11 MED ORDER — LISINOPRIL 5 MG PO TABS
5.0000 mg | ORAL_TABLET | Freq: Every day | ORAL | Status: DC
  Administered 2018-02-11 – 2018-02-14 (×4): 5 mg via ORAL
  Filled 2018-02-11 (×4): qty 1

## 2018-02-11 MED ORDER — CALCIUM CARBONATE-VITAMIN D 500-200 MG-UNIT PO TABS
1.0000 | ORAL_TABLET | Freq: Every day | ORAL | Status: DC
  Administered 2018-02-12 – 2018-02-14 (×3): 1 via ORAL
  Filled 2018-02-11 (×4): qty 1

## 2018-02-11 MED ORDER — BISACODYL 5 MG PO TBEC: 5.0000 mg | DELAYED_RELEASE_TABLET | Freq: Every day | ORAL | Status: DC | PRN

## 2018-02-11 MED ORDER — METHYLPREDNISOLONE SODIUM SUCC 40 MG IJ SOLR
40.0000 mg | Freq: Two times a day (BID) | INTRAMUSCULAR | Status: DC
  Administered 2018-02-11 – 2018-02-14 (×6): 40 mg via INTRAVENOUS
  Filled 2018-02-11 (×6): qty 1

## 2018-02-11 MED ORDER — ASPIRIN EC 81 MG PO TBEC
81.0000 mg | DELAYED_RELEASE_TABLET | Freq: Every day | ORAL | Status: DC
  Administered 2018-02-11 – 2018-02-13 (×3): 81 mg via ORAL
  Filled 2018-02-11 (×3): qty 1

## 2018-02-11 MED ORDER — SODIUM CHLORIDE 0.9% FLUSH
3.0000 mL | Freq: Two times a day (BID) | INTRAVENOUS | Status: DC
  Administered 2018-02-11 – 2018-02-14 (×6): 3 mL via INTRAVENOUS

## 2018-02-11 MED ORDER — IPRATROPIUM-ALBUTEROL 0.5-2.5 (3) MG/3ML IN SOLN
RESPIRATORY_TRACT | Status: AC
  Administered 2018-02-11: 5 mL via RESPIRATORY_TRACT
  Filled 2018-02-11: qty 6

## 2018-02-11 MED ORDER — IPRATROPIUM-ALBUTEROL 0.5-2.5 (3) MG/3ML IN SOLN
5.0000 mL | Freq: Once | RESPIRATORY_TRACT | Status: AC
  Administered 2018-02-11: 5 mL via RESPIRATORY_TRACT

## 2018-02-11 MED ORDER — ONDANSETRON HCL 4 MG/2ML IJ SOLN: 4.0000 mg | Freq: Four times a day (QID) | INTRAMUSCULAR | Status: DC | PRN

## 2018-02-11 MED ORDER — SENNOSIDES-DOCUSATE SODIUM 8.6-50 MG PO TABS: 1.0000 | ORAL_TABLET | Freq: Every evening | ORAL | Status: DC | PRN

## 2018-02-11 MED ORDER — SODIUM CHLORIDE 0.9 % IV SOLN: 250.0000 mL | INTRAVENOUS | Status: DC | PRN

## 2018-02-11 MED ORDER — ALBUTEROL SULFATE (2.5 MG/3ML) 0.083% IN NEBU
2.5000 mg | INHALATION_SOLUTION | Freq: Four times a day (QID) | RESPIRATORY_TRACT | Status: DC
  Administered 2018-02-11 – 2018-02-14 (×11): 2.5 mg via RESPIRATORY_TRACT
  Filled 2018-02-11 (×11): qty 3

## 2018-02-11 NOTE — Progress Notes (Signed)
Advanced Care Plan.  Purpose of Encounter: CODE STATUS Parties in Attendance: The patient, her daughter, her son-in-law and me. Patient's Decisional Capacity: No. Medical Story: [name]  is a 84 y.o. female with a known history of multiple medical problems including diastolic CHF, aortic stenosis, hypertension, RA, COPD, CVA hypothyroidism and AVM.  The patient is being admitted for acute respiratory failure with hypoxia due to acute on chronic diastolic CHF and COPD exacerbation.  He has multiple medical problem and has poor prognosis.  I discussed with her daughter about her CODE STATUS.  Her daughter said she is in DNR.  Plan:  Code Status: DNR. Time spent discussing advance care planning: 18 minutes.

## 2018-02-11 NOTE — H&P (Signed)
[employer] [facility] [place] at [facility]   PATIENT NAME: [name]    MR#:  [id number]  DATE OF BIRTH:  01-Sep-1928  DATE OF ADMISSION:  02/11/2018  PRIMARY CARE PHYSICIAN: Marguarite Arbour, MD   REQUESTING/REFERRING PHYSICIAN: Sharyn Creamer, MD  CHIEF COMPLAINT:   Chief Complaint  Patient presents with  . Wheezing    hx copd   Wheezing, shortness of breath and coughing for 5 days. HISTORY OF PRESENT ILLNESS:  [name]  is a 84 y.o. female with a known history of multiple medical problems including diastolic CHF, aortic stenosis, hypertension, RA, COPD, CVA hypothyroidism and AVM.  The patient was sent to ED from home due to above chief complaints.  She is very hard of hearing, unable to provide information.  Per her daughter, the patient has had wheezing, cough and shortness of breath for the past 5 days and worsening today.  She was treated with nebulizer in the ED. the chest x-ray show pulmonary edema with small pleural effusion.  ED physician discussed with Dr. Gwen Pounds, who suggests to start IV Lasix 20 mg twice daily.  PAST MEDICAL HISTORY:   Past Medical History:  Diagnosis Date  . Acute diastolic CHF (congestive heart failure) (HCC) 02/11/2018  . Anemia   . Anxiety   . Anxiety   . Aortic stenosis   . ASD (atrial septal defect)   . AVM (arteriovenous malformation)    pulmonary, s/p cautery  . Bronchitis   . Chronic obstruct airways disease (HCC)   . Depression   . Heart murmur   . HTN (hypertension)   . Hypothyroidism   . Osteoporosis, postmenopausal   . RA (rheumatoid arthritis) (HCC)   . Stroke [facility])     PAST SURGICAL HISTORY:   Past Surgical History:  Procedure Laterality Date  . CHOLECYSTECTOMY    . FEMUR FRACTURE SURGERY    . FRACTURE SURGERY    . TOTAL ABDOMINAL HYSTERECTOMY      SOCIAL HISTORY:   Social History   Tobacco Use  . Smoking status: Never Smoker  . Smokeless tobacco: Never Used  Substance Use Topics  .  Alcohol use: No    Alcohol/week: 0.0 oz    FAMILY HISTORY:   Family History  Problem Relation Age of Onset  . CAD Mother   . Stroke Mother   . Lung cancer Father   . Rheum arthritis Other     DRUG ALLERGIES:  No Known Allergies  REVIEW OF SYSTEMS:   Review of Systems  Unable to perform ROS: Medical condition    MEDICATIONS AT HOME:   Prior to Admission medications   Medication Sig Start Date End Date Taking? Authorizing Provider  acetaminophen (TYLENOL) 325 MG tablet Take 2 tablets (650 mg total) by mouth every 6 (six) hours as needed for mild pain (or Fever >/= 101). Patient taking differently: Take 500 mg by mouth every 6 (six) hours as needed for mild pain (or Fever >/= 101).  04/29/15   [name], Duane Lope, MD  ALPRAZolam Prudy Feeler) 0.25 MG tablet Take 1 tablet (0.25 mg total) by mouth 2 (two) times daily as needed for anxiety. Patient taking differently: Take 0.25 mg by mouth 2 (two) times daily as needed for anxiety or sleep.  04/29/15   Marguarite Arbour, MD  amoxicillin-clavulanate (AUGMENTIN) 250-62.5 MG/5ML suspension Take 10 mLs (500 mg total) by mouth 2 (two) times daily. 03/19/16   Katharina Caper, MD  antiseptic oral rinse (CPC / CETYLPYRIDINIUM CHLORIDE  0.05%) 0.05 % LIQD solution 7 mLs by Mouth Rinse route 2 times daily at 12 noon and 4 pm. 03/19/16   Katharina Caper, MD  aspirin EC 81 MG EC tablet Take 1 tablet (81 mg total) by mouth daily. 04/29/15   Marguarite Arbour, MD  budesonide (PULMICORT) 0.25 MG/2ML nebulizer solution Take 2 mLs (0.25 mg total) by nebulization 2 (two) times daily. 03/19/16   Katharina Caper, MD  Calcium Carbonate-Vitamin D (CALCIUM-VITAMIN D) 500-200 MG-UNIT per tablet Take 1 tablet by mouth daily.    [provider]  Cholecalciferol (VITAMIN D-3) 1000 UNITS CAPS Take 1 capsule by mouth daily.    [provider]  feeding supplement, ENSURE ENLIVE, (ENSURE ENLIVE) LIQD Take 237 mLs by mouth daily. 07/11/15   Marguarite Arbour, MD    guaiFENesin (MUCINEX) 600 MG 12 hr tablet Take 1 tablet (600 mg total) by mouth 2 (two) times daily. Patient not taking: Reported on 07/10/2015 04/29/15   Marguarite Arbour, MD  ipratropium-albuterol (DUONEB) 0.5-2.5 (3) MG/3ML SOLN Take 3 mLs by nebulization 4 (four) times daily. Patient taking differently: Take 3 mLs by nebulization 2 (two) times daily.  04/29/15   Marguarite Arbour, MD  levothyroxine (SYNTHROID, LEVOTHROID) 25 MCG tablet Take 25 mcg by mouth daily before breakfast.    [provider]  losartan (COZAAR) 25 MG tablet Take 1 tablet (25 mg total) by mouth daily. 03/19/16   Katharina Caper, MD  metoCLOPramide (REGLAN) 5 MG tablet Take 5 mg by mouth 3 (three) times daily before meals.    [provider]  mirtazapine (REMERON) 30 MG tablet Take 30 mg by mouth at bedtime.    [provider]  Multiple Vitamin (MULTIVITAMIN WITH MINERALS) TABS tablet Take 1 tablet by mouth daily.    [provider]  ondansetron (ZOFRAN) 4 MG tablet Take 1 tablet (4 mg total) by mouth every 6 (six) hours as needed for nausea. Patient not taking: Reported on 03/12/2016 07/11/15   Marguarite Arbour, MD  pantoprazole (PROTONIX) 40 MG tablet Take 40 mg by mouth 2 (two) times daily.    [provider]  polyethylene glycol (MIRALAX / GLYCOLAX) packet Take 17 g by mouth daily as needed for mild constipation. Patient not taking: Reported on 07/10/2015 04/29/15   Marguarite Arbour, MD  predniSONE (STERAPRED UNI-PAK 21 TAB) 10 MG (21) TBPK tablet Take 1 tablet (10 mg total) by mouth daily. Please take 6 pills in the morning on the day 1 and 2, then taper by one pill every 2 days until finished. Thank you 03/19/16   Katharina Caper, MD  senna-docusate (SENOKOT-S) 8.6-50 MG tablet Take 2 tablets by mouth daily.    [provider]  SPIRIVA HANDIHALER 18 MCG inhalation capsule Place 1 capsule into inhaler and inhale daily.    [provider]      VITAL SIGNS:  Blood  pressure (!) 167/100, pulse (!) 103, temperature 98.1 F (36.7 C), temperature source Oral, resp. rate (!) 26, height 5\' 6"  (1.676 m), weight 103 lb (46.7 kg), SpO2 99 %.  PHYSICAL EXAMINATION:  Physical Exam  GENERAL:  84 y.o.-year-old patient lying in the bed with no acute distress.  EYES: Pupils equal, round, reactive to light and accommodation. No scleral icterus. Extraocular muscles intact.  HEENT: Head atraumatic, normocephalic. Oropharynx and nasopharynx clear.  NECK:  Supple, no jugular venous distention. No thyroid enlargement, no tenderness.  LUNGS: Diminished breath sounds bilaterally, bilateral expiratory wheezing, bibasilar. No use of  accessory muscles of respiration.  CARDIOVASCULAR: S1, S2 normal. 4/6 systolic murmurs, no rubs, or gallops.  ABDOMEN: Soft, nontender, nondistended. Bowel sounds present. No organomegaly or mass.  EXTREMITIES: No cyanosis, or clubbing.  Trace edema of the legs. NEUROLOGIC: Limited examination. PSYCHIATRIC: The patient is awake but not limited to communication due to severe hard hearing. SKIN: No obvious rash, lesion, or ulcer.   LABORATORY PANEL:   CBC Recent Labs  Lab 02/11/18 1318  WBC 5.4  HGB 12.2  HCT 35.7  PLT 140*   ------------------------------------------------------------------------------------------------------------------  Chemistries  No results for input(s): NA, K, CL, CO2, GLUCOSE, BUN, CREATININE, CALCIUM, MG, AST, ALT, ALKPHOS, BILITOT in the last 168 hours.  Invalid input(s): GFRCGP ------------------------------------------------------------------------------------------------------------------  Cardiac Enzymes No results for input(s): TROPONINI in the last 168 hours. ------------------------------------------------------------------------------------------------------------------  RADIOLOGY:  Dg Chest 2 View  Result Date: 02/11/2018 CLINICAL DATA:  COPD.  Wheezing today. EXAM: CHEST - 2 VIEW COMPARISON:   Single-view of the chest 03/14/2016 and 0 5 04/28/2015. CT chest 04/29/2015. FINDINGS: There are small bilateral pleural effusions. Cardiomegaly and interstitial edema are seen. Mild basilar atelectasis is noted. Aortic atherosclerosis is identified. IMPRESSION: Interstitial pulmonary edema with associated small bilateral pleural effusions. Cardiomegaly. Atherosclerosis. Electronically Signed   By: Drusilla Kanner M.D.   On: 02/11/2018 13:55      IMPRESSION AND PLAN:   Acute respiratory failure with hypoxia due to acute on chronic diastolic CHF and COPD exacerbation. The patient will be admitted to medical floor with telemetry monitor. Start Lasix 20 mg IV twice daily, albuterol every 6 hours, IV Solu-Medrol, echocardiograph and cardiology consult from [facility] cardiologist group.  Hypokalemia.  Potassium supplement and follow-up BMP and magnesium level. Hypertension.  Continue home hypertension medication.  All the records are reviewed and case discussed with ED provider. Management plans discussed with the patient, her daughter and son-in-law and they are in agreement.  CODE STATUS: DNR.  TOTAL TIME TAKING CARE OF THIS PATIENT: 56 minutes.    Shaune Pollack M.D on 02/11/2018 at 3:11 PM  Between 7am to 6pm - Pager - [phone]  After 6pm go to [email or web address] - Social research officer, government  [facility] [place] [facility]  Office  [phone]  CC: Primary care physician; Marguarite Arbour, MD   Note: This dictation was prepared with Dragon dictation along with smaller phrase technology. Any transcriptional errors that result from this process are unin

## 2018-02-11 NOTE — ED Provider Notes (Signed)
[facility] Emergency Department Provider Note  ____________________________________________   First MD Initiated Contact with Patient 02/11/18 1307     (approximate)  I have reviewed the triage vital signs and the nursing notes.   HISTORY  Chief Complaint Wheezing (hx copd)  HPI [name] is a 84 y.o. female returns for evaluation of shortness of breath  Patient reports lifelong history with her family that she is been experiencing shortness of breath using inhalers last couple of days but worsening.  She is experience increased shortness of breath when she tries to lay down at night is also noticed swelling over around her lower feet and ankles for the last 3-4 days which is new  No nausea or vomiting.  No chest pain.  Reports feeling slightly short of breath now but better with oxygen.  Notes that she can hear wheezing and has heard wheezing that is been worsening for the last 4-5 days  History of problems with her heart valves, follows with Dr. Gwen Pounds.  No history of congestive heart failure.   Past Medical History:  Diagnosis Date  . Acute diastolic CHF (congestive heart failure) (HCC) 02/11/2018  . Anemia   . Anxiety   . Anxiety   . Aortic stenosis   . ASD (atrial septal defect)   . AVM (arteriovenous malformation)    pulmonary, s/p cautery  . Bronchitis   . Chronic obstruct airways disease (HCC)   . Depression   . Heart murmur   . HTN (hypertension)   . Hypothyroidism   . Osteoporosis, postmenopausal   . RA (rheumatoid arthritis) (HCC)   . Stroke [facility])     Patient Active Problem List   Diagnosis Date Noted  . Acute diastolic CHF (congestive heart failure) (HCC) 02/11/2018  . Acute bronchitis 03/19/2016  . Klebsiella pneumoniae infection 03/19/2016  . Proteus mirabilis infection 03/19/2016  . Acute renal failure (HCC) 03/19/2016  . Sinus tachycardia 03/19/2016  . Essential hypertension 03/19/2016  . Constipation 03/19/2016   . Severe aortic stenosis 03/19/2016  . LVH (left ventricular hypertrophy) 03/19/2016  . Moderate mitral regurgitation 03/19/2016  . Malnutrition of moderate degree 03/15/2016  . Acute respiratory failure (HCC) 03/12/2016  . COPD exacerbation (HCC) 07/10/2015  . Hemoptysis 07/10/2015  . Chronic anemia 07/10/2015  . Generalized weakness 07/10/2015  . HTN (hypertension) 07/10/2015  . Aortic stenosis 07/10/2015  . Depression 07/10/2015  . Hypothyroidism 07/10/2015  . Pressure ulcer stage III 07/10/2015  . Pressure ulcer 04/28/2015  . Acetabular fracture (HCC) 04/27/2015    Past Surgical History:  Procedure Laterality Date  . CHOLECYSTECTOMY    . FEMUR FRACTURE SURGERY    . FRACTURE SURGERY    . TOTAL ABDOMINAL HYSTERECTOMY      Prior to Admission medications   Medication Sig Start Date End Date Taking? Authorizing Provider  acetaminophen (TYLENOL) 325 MG tablet Take 2 tablets (650 mg total) by mouth every 6 (six) hours as needed for mild pain (or Fever >/= 101). Patient taking differently: Take 500 mg by mouth every 6 (six) hours as needed for mild pain (or Fever >/= 101).  04/29/15   [name], Duane Lope, MD  ALPRAZolam Prudy Feeler) 0.25 MG tablet Take 1 tablet (0.25 mg total) by mouth 2 (two) times daily as needed for anxiety. Patient taking differently: Take 0.25 mg by mouth 2 (two) times daily as needed for anxiety or sleep.  04/29/15   Marguarite Arbour, MD  amoxicillin-clavulanate (AUGMENTIN) 250-62.5 MG/5ML suspension Take 10 mLs (500  mg total) by mouth 2 (two) times daily. 03/19/16   Katharina Caper, MD  antiseptic oral rinse (CPC / CETYLPYRIDINIUM CHLORIDE 0.05%) 0.05 % LIQD solution 7 mLs by Mouth Rinse route 2 times daily at 12 noon and 4 pm. 03/19/16   Katharina Caper, MD  aspirin EC 81 MG EC tablet Take 1 tablet (81 mg total) by mouth daily. 04/29/15   Marguarite Arbour, MD  budesonide (PULMICORT) 0.25 MG/2ML nebulizer solution Take 2 mLs (0.25 mg total) by nebulization 2 (two) times  daily. 03/19/16   Katharina Caper, MD  Calcium Carbonate-Vitamin D (CALCIUM-VITAMIN D) 500-200 MG-UNIT per tablet Take 1 tablet by mouth daily.    [provider]  Cholecalciferol (VITAMIN D-3) 1000 UNITS CAPS Take 1 capsule by mouth daily.    [provider]  feeding supplement, ENSURE ENLIVE, (ENSURE ENLIVE) LIQD Take 237 mLs by mouth daily. 07/11/15   Marguarite Arbour, MD  guaiFENesin (MUCINEX) 600 MG 12 hr tablet Take 1 tablet (600 mg total) by mouth 2 (two) times daily. Patient not taking: Reported on 07/10/2015 04/29/15   Marguarite Arbour, MD  ipratropium-albuterol (DUONEB) 0.5-2.5 (3) MG/3ML SOLN Take 3 mLs by nebulization 4 (four) times daily. Patient taking differently: Take 3 mLs by nebulization 2 (two) times daily.  04/29/15   Marguarite Arbour, MD  levothyroxine (SYNTHROID, LEVOTHROID) 25 MCG tablet Take 25 mcg by mouth daily before breakfast.    [provider]  losartan (COZAAR) 25 MG tablet Take 1 tablet (25 mg total) by mouth daily. 03/19/16   Katharina Caper, MD  metoCLOPramide (REGLAN) 5 MG tablet Take 5 mg by mouth 3 (three) times daily before meals.    [provider]  mirtazapine (REMERON) 30 MG tablet Take 30 mg by mouth at bedtime.    [provider]  Multiple Vitamin (MULTIVITAMIN WITH MINERALS) TABS tablet Take 1 tablet by mouth daily.    [provider]  ondansetron (ZOFRAN) 4 MG tablet Take 1 tablet (4 mg total) by mouth every 6 (six) hours as needed for nausea. Patient not taking: Reported on 03/12/2016 07/11/15   Marguarite Arbour, MD  pantoprazole (PROTONIX) 40 MG tablet Take 40 mg by mouth 2 (two) times daily.    [provider]  polyethylene glycol (MIRALAX / GLYCOLAX) packet Take 17 g by mouth daily as needed for mild constipation. Patient not taking: Reported on 07/10/2015 04/29/15   Marguarite Arbour, MD  predniSONE (STERAPRED UNI-PAK 21 TAB) 10 MG (21) TBPK tablet Take 1 tablet (10 mg total) by mouth daily. Please  take 6 pills in the morning on the day 1 and 2, then taper by one pill every 2 days until finished. Thank you 03/19/16   Katharina Caper, MD  senna-docusate (SENOKOT-S) 8.6-50 MG tablet Take 2 tablets by mouth daily.    [provider]  SPIRIVA HANDIHALER 18 MCG inhalation capsule Place 1 capsule into inhaler and inhale daily.    [provider]    Allergies Patient has no known allergies.  Family History  Problem Relation Age of Onset  . CAD Mother   . Stroke Mother   . Lung cancer Father   . Rheum arthritis Other     Social History Social History   Tobacco Use  . Smoking status: Never Smoker  . Smokeless tobacco: Never Used  Substance Use Topics  . Alcohol use: No    Alcohol/week: 0.0 oz  . Drug use: No    Review of Systems Constitutional:  No fever/chills Eyes: No visual changes. ENT: No sore throat. Cardiovascular: Denies chest pain. Respiratory: Shortness of breath Genitourinary: Negative for dysuria. Musculoskeletal: Negative for back pain.   Skin: Negative for rash. Neurological: Negative for headaches, focal weakness or numbness.    ____________________________________________   PHYSICAL EXAM:  VITAL SIGNS: ED Triage Vitals  Enc Vitals Group     BP 02/11/18 1307 (!) 170/89     Pulse Rate 02/11/18 1307 97     Resp 02/11/18 1307 20     Temp 02/11/18 1307 98.1 F (36.7 C)     Temp Source 02/11/18 1307 Oral     SpO2 02/11/18 1307 92 %     Weight 02/11/18 1309 103 lb (46.7 kg)     Height 02/11/18 1309 5\' 6"  (1.676 m)     Head Circumference --      Peak Flow --      Pain Score 02/11/18 1307 0     Pain Loc --      Pain Edu? --      Excl. in GC? --     Constitutional: Alert and oriented.  Moderately ill-appearing.  In no distress, very pleasant.  Eyes: Conjunctivae are normal. Head: Atraumatic. Nose: No congestion/rhinnorhea. Mouth/Throat: Mucous membranes normal Neck: No stridor.   Cardiovascular: Normal rate, regular rhythm.   Moderate systolic ejection murmur.  Good peripheral circulation. Respiratory: There is moderate and extrapiratory wheezing throughout with diminished lung sounds in the bases bilaterally.  Mild crackles at the lower lobes bilateral.  Speaks in phrases, mild increased work of breathing.  Mild use of accessory muscles. Gastrointestinal: Soft and nontender. Musculoskeletal: No lower extremity tenderness nor edema. Neurologic:  Normal speech and language. No gross focal neurologic deficits are appreciated.  Skin:  Skin is warm, slightly cool peripherally and intact. No rash noted.  Slightly reduced capillary refill. Psychiatric: Mood and affect are normal. Speech and behavior are normal.  ____________________________________________   LABS (all labs ordered are listed, but only abnormal results are displayed)  Labs Reviewed  CBC WITH DIFFERENTIAL/PLATELET - Abnormal; Notable for the following components:      Result Value   RBC 3.28 (*)    MCV 108.8 (*)    MCH 37.2 (*)    Platelets 140 (*)    Eosinophils Absolute 0.8 (*)    All other components within normal limits  COMPREHENSIVE METABOLIC PANEL - Abnormal; Notable for the following components:   Sodium 134 (*)    Chloride 97 (*)    Glucose, Bld 138 (*)    Calcium 8.6 (*)    Total Protein 6.3 (*)    All other components within normal limits  BRAIN NATRIURETIC PEPTIDE - Abnormal; Notable for the following components:   B Natriuretic Peptide 1,658.0 (*)    All other components within normal limits  TROPONIN I   ____________________________________________  EKG  Reviewed and interpreted me at 1310 Heart rate 95 QRS 130 QTC 450 Normal sinus rhythm, right bundle branch block.  No evidence of acute ischemia ____________________________________________  RADIOLOGY    Chest x-ray reviewed by me, pulmonary edema with small bilateral effusions ____________________________________________   PROCEDURES  Procedure(s) performed:  None  Procedures  Critical Care performed: No  ____________________________________________   INITIAL IMPRESSION / ASSESSMENT AND PLAN / ED COURSE  Pertinent labs & imaging results that were available during my care of the patient were reviewed by me and considered in my medical decision making (see chart for details).  Patient presents for evaluation of  wheezing and increased shortness of breath also noted to have some new onset peripheral edema.  She does have a notable history of aortic stenosis, and has mild increased work of breathing with expiratory wheezing.  She has a history of COPD which we are treating for, but in review her chest x-ray elevated BNP and additional findings I suspect this may be other mixed picture of CHF with a COPD exacerbation or could potentially be a primary CHF exacerbation possibly promoted by her aortic stenosis.  Discussed case and care with Dr. Gwen Pounds, her cardiologist who advises initiate small dose of Lasix for diuresis.  Have initiated this.  No signs of acute coronary syndrome.  ----------------------------------------- 3:43 PM on 02/11/2018 -----------------------------------------  Patient resting comfortably on 2 L nasal cannula.  Work of breathing appears to be improving.  She reports improvement.  Awaiting admission to the hospital for further workup.      ____________________________________________   FINAL CLINICAL IMPRESSION(S) / ED DIAGNOSES  Final diagnoses:  Acute congestive heart failure, unspecified heart failure type (HCC)  Pulmonary edema, acute (HCC)      NEW MEDICATIONS STARTED DURING THIS VISIT:  New Prescriptions   No medications on file     Note:  This document was prepared using Dragon voice recognition software and may include unintentional dictation errors.     Sharyn Creamer, MD 02/11/18 1544

## 2018-02-11 NOTE — ED Triage Notes (Signed)
Hx of copd - wheezing today without relief from her albuterol or spiriva. Given duoneb by ems

## 2018-02-11 NOTE — ED Notes (Signed)
Attempted report - per secretary they need to change the bed closer to the nurses station d/t pt's age. Secretary made aware of the change.

## 2018-02-12 ENCOUNTER — Inpatient Hospital Stay
Admit: 2018-02-12 | Discharge: 2018-02-12 | Disposition: A | Discharge status: Home / Self Care | Payer: Medicare Other | Attending: Internal Medicine | Admitting: Internal Medicine

## 2018-02-12 LAB — BASIC METABOLIC PANEL
ANION GAP: 6 (ref 5–15)
BUN: 14 mg/dL (ref 6–20)
CHLORIDE: 97 mmol/L — AB (ref 101–111)
CO2: 35 mmol/L — ABNORMAL HIGH (ref 22–32)
Calcium: 8.8 mg/dL — ABNORMAL LOW (ref 8.9–10.3)
Creatinine, Ser: 0.83 mg/dL (ref 0.44–1.00)
GFR calc Af Amer: >60 mL/min (ref >60)
GFR calc non Af Amer: >60 mL/min (ref >60)
Glucose, Bld: 117 mg/dL — ABNORMAL HIGH (ref 65–99)
POTASSIUM: 3.6 mmol/L (ref 3.5–5.1)
SODIUM: 138 mmol/L (ref 135–145)

## 2018-02-12 LAB — ECHOCARDIOGRAM COMPLETE
Height: 66 in
WEIGHTICAEL: 1540.8 [oz_av]

## 2018-02-12 NOTE — Progress Notes (Signed)
[employer] [facility]                                                                                                                                                                                  Patient Demographics   [name], is a 84 y.o. female, DOB - 1928-04-28, VZD:[id number]  Admit date - 02/11/2018   Admitting Physician Shaune Pollack, MD  Outpatient Primary MD for the patient is Marguarite Arbour, MD   LOS - 1  Subjective: Patient feeling better shortness of breath improved No chest pain   Review of Systems:   CONSTITUTIONAL: No documented fever. No fatigue, weakness. No weight gain, no weight loss.  EYES: No blurry or double vision.  ENT: No tinnitus. No postnasal drip. No redness of the oropharynx.  RESPIRATORY: No cough, no wheeze, no hemoptysis. positive dyspnea.  CARDIOVASCULAR: No chest pain. No orthopnea. No palpitations. No syncope.  GASTROINTESTINAL: No nausea, no vomiting or diarrhea. No abdominal pain. No melena or hematochezia.  GENITOURINARY: No dysuria or hematuria.  ENDOCRINE: No polyuria or nocturia. No heat or cold intolerance.  HEMATOLOGY: No anemia. No bruising. No bleeding.  INTEGUMENTARY: No rashes. No lesions.  MUSCULOSKELETAL: No arthritis. No swelling. No gout.  NEUROLOGIC: No numbness, tingling, or ataxia. No seizure-type activity.  PSYCHIATRIC: No anxiety. No insomnia. No ADD.    Vitals:   Vitals:   02/11/18 2026 02/12/18 0316 02/12/18 0827 02/12/18 0837  BP: 127/75 124/62 102/61   Pulse: 89 78 84   Resp: 16 18 16    Temp: 98 F (36.7 C) 97.7 F (36.5 C) 97.8 F (36.6 C)   TempSrc: Oral Oral Oral   SpO2: 96% 97% 97% 93%  Weight:  43.7 kg (96 lb 4.8 oz)    Height:        Wt Readings from Last 3 Encounters:  02/12/18 43.7 kg (96 lb 4.8 oz)  03/19/16 49.9 kg (110 lb)  07/11/15 48.9 kg (107 lb 14.4 oz)     Intake/Output Summary (Last 24 hours) at 02/12/2018 1339 Last data filed at 02/12/2018  1008 Gross per 24 hour  Intake 480 ml  Output 1900 ml  Net -1420 ml    Physical Exam:   GENERAL: Pleasant-appearing in no apparent distress.  HEAD, EYES, EARS, NOSE AND THROAT: Atraumatic, normocephalic. Extraocular muscles are intact. Pupils equal and reactive to light. Sclerae anicteric. No conjunctival injection. No oro-pharyngeal erythema.  NECK: Supple. There is no jugular venous distention. No bruits, no lymphadenopathy, no thyromegaly.  HEART: Regular rate and rhythm,.  Positive systolic murmurs, no rubs, no clicks.  LUNGS: Clear to auscultation bilaterally. No rales or rhonchi. No wheezes.  ABDOMEN: Soft, flat, nontender, nondistended. Has good bowel sounds. No hepatosplenomegaly appreciated.  EXTREMITIES: No evidence of any cyanosis, clubbing, or peripheral edema.  +2 pedal and radial pulses bilaterally.  NEUROLOGIC: The patient is alert, awake, and oriented x3 with no focal motor or sensory deficits appreciated bilaterally.  SKIN: Moist and warm with no rashes appreciated.  Psych: Not anxious, depressed LN: No inguinal LN enlargement    Antibiotics   Anti-infectives (From admission, onward)   None      Medications   Scheduled Meds: . albuterol  2.5 mg Nebulization Q6H  . aspirin EC  81 mg Oral QHS  . calcium-vitamin D  1 tablet Oral Daily  . cholecalciferol  1,000 Units Oral Daily  . enoxaparin (LOVENOX) injection  40 mg Subcutaneous Q24H  . feeding supplement (ENSURE ENLIVE)  237 mL Oral Q24H  . furosemide  20 mg Intravenous Q12H  . lisinopril  5 mg Oral Daily  . methylPREDNISolone (SOLU-MEDROL) injection  40 mg Intravenous Q12H  . mirtazapine  30 mg Oral QHS  . pantoprazole  40 mg Oral BID AC  . potassium chloride  10 mEq Oral Daily  . senna-docusate  2 tablet Oral Daily  . sodium chloride flush  3 mL Intravenous Q12H  . tiotropium  1 capsule Inhalation Daily   Continuous Infusions: . sodium chloride     PRN Meds:.sodium chloride, acetaminophen **OR**  acetaminophen, albuterol, ALPRAZolam, bisacodyl, HYDROcodone-acetaminophen, ketorolac, ondansetron **OR** ondansetron (ZOFRAN) IV, senna-docusate, sodium chloride flush   Data Review:   Micro Results No results found for this or any previous visit (from the past 240 hour(s)).  Radiology Reports Dg Chest 2 View  Result Date: 02/11/2018 CLINICAL DATA:  COPD.  Wheezing today. EXAM: CHEST - 2 VIEW COMPARISON:  Single-view of the chest 03/14/2016 and 0 5 04/28/2015. CT chest 04/29/2015. FINDINGS: There are small bilateral pleural effusions. Cardiomegaly and interstitial edema are seen. Mild basilar atelectasis is noted. Aortic atherosclerosis is identified. IMPRESSION: Interstitial pulmonary edema with associated small bilateral pleural effusions. Cardiomegaly. Atherosclerosis. Electronically Signed   By: Drusilla Kanner M.D.   On: 02/11/2018 13:55     CBC Recent Labs  Lab 02/11/18 1318  WBC 5.4  HGB 12.2  HCT 35.7  PLT 140*  MCV 108.8*  MCH 37.2*  MCHC 34.2  RDW 13.2  LYMPHSABS 1.5  MONOABS 0.2  EOSABS 0.8*  BASOSABS 0.1    Chemistries  Recent Labs  Lab 02/11/18 1318 02/12/18 0536  NA 134* 138  K 3.6 3.6  CL 97* 97*  CO2 32 35*  GLUCOSE 138* 117*  BUN 12 14  CREATININE 0.80 0.83  CALCIUM 8.6* 8.8*  MG 2.0  --   AST 22  --   ALT 20  --   ALKPHOS 66  --   BILITOT 0.6  --    ------------------------------------------------------------------------------------------------------------------ estimated creatinine clearance is 32.9 mL/min (by C-G formula based on SCr of 0.83 mg/dL). ------------------------------------------------------------------------------------------------------------------ No results for input(s): HGBA1C in the last 72 hours. ------------------------------------------------------------------------------------------------------------------ No results for input(s): CHOL, HDL, LDLCALC, TRIG, CHOLHDL, LDLDIRECT in the last 72  hours. ------------------------------------------------------------------------------------------------------------------ No results for input(s): TSH, T4TOTAL, T3FREE, THYROIDAB in the last 72 hours.  Invalid input(s): FREET3 ------------------------------------------------------------------------------------------------------------------ No results for input(s): VITAMINB12, FOLATE, FERRITIN, TIBC, IRON, RETICCTPCT in the last 72 hours.  Coagulation profile No results for input(s): INR, PROTIME in the last 168 hours.  No results for input(s): DDIMER in the last 72 hours.  Cardiac Enzymes Recent Labs  Lab 02/11/18 1318  TROPONINI <  0.03   ------------------------------------------------------------------------------------------------------------------ Invalid input(s): POCBNP    Assessment & Plan  Patient is a 84 year old white female with history of severe aortic stenosis presenting with shortness of breath  #1 acute respiratory failure with hypoxia due to acute on chronic diastolic CHF and COPD exacerbation.  #2 acute on chronic diastolic CHF continue low-dose Lasix  #3 acute on chronic COPD exacerbation continue therapy with nebulizer and Solu-Medrol  #4 severe aortic stenosis repeat echo pending Seen by cardiology  #5 Hypokalemia potassium replaced  #6 previous history of CVA continue aspirin  #7 essential hypertension pressure currently stable  #8 status DNR  The patient will be admitted to medical floor with telemetry monitor. Start Lasix 20 mg IV twice daily, albuterol every 6 hours, IV Solu-Medrol, echocardiograph and cardiology consult from [facility] cardiologist group.  Hypokalemia.  Potassium supplement and follow-up BMP and magnesium level. Hypertension.  Continue home hypertension medication.        Code Status Orders  (From admission, onward)        Start     Ordered   02/11/18 1820  Do not attempt resuscitation (DNR)  Continuous    Question  Answer Comment  In the event of cardiac or respiratory ARREST Do not call a "code blue"   In the event of cardiac or respiratory ARREST Do not perform Intubation, CPR, defibrillation or ACLS   In the event of cardiac or respiratory ARREST Use medication by any route, position, wound care, and other measures to relive pain and suffering. May use oxygen, suction and manual treatment of airway obstruction as needed for comfort.      02/11/18 1819    Code Status History    Date Active Date Inactive Code Status Order ID Comments User Context   03/13/2016 1415 03/19/2016 1657 DNR [id number]  Merwyn Katos, MD Inpatient   03/12/2016 2159 03/13/2016 1415 Full Code [id number]  Lewie Loron, NP ED   07/10/2015 0543 07/11/2015 2135 Full Code [id number]  Crissie Figures, MD Inpatient   04/27/2015 2017 04/29/2015 1707 Full Code [id number]  Hower, Cletis Athens, MD ED    Advance Directive Documentation     Most Recent Value  Type of Advance Directive  Healthcare Power of Attorney  Pre-existing out of facility DNR order (yellow form or pink MOST form)  -  "MOST" Form in Place?  -           Consults cards  DVT Prophylaxis  lovenox  Lab Results  Component Value Date   PLT 140 (L) 02/11/2018     Time Spent in minutes    Greater than 50% of time spent in care coordination and counseling patient regarding the condition and plan of care.   Auburn Bilberry M.D on 02/12/2018 at 1:39 PM  Between 7am to 6pm - Pager - [phone]  After 6pm go to [email or web address] - Social research officer, government  [employer]   Office  [phone]

## 2018-02-13 LAB — BASIC METABOLIC PANEL
ANION GAP: 7 (ref 5–15)
BUN: 23 mg/dL — AB (ref 6–20)
CHLORIDE: 97 mmol/L — AB (ref 101–111)
CO2: 36 mmol/L — ABNORMAL HIGH (ref 22–32)
Calcium: 8.7 mg/dL — ABNORMAL LOW (ref 8.9–10.3)
Creatinine, Ser: 0.86 mg/dL (ref 0.44–1.00)
GFR calc Af Amer: >60 mL/min (ref >60)
GFR, EST NON AFRICAN AMERICAN: 59 mL/min — AB (ref >60)
GLUCOSE: 112 mg/dL — AB (ref 65–99)
POTASSIUM: 3.3 mmol/L — AB (ref 3.5–5.1)
SODIUM: 140 mmol/L (ref 135–145)

## 2018-02-13 MED ORDER — POTASSIUM CHLORIDE CRYS ER 20 MEQ PO TBCR
40.0000 meq | EXTENDED_RELEASE_TABLET | Freq: Once | ORAL | Status: AC
  Administered 2018-02-13: 40 meq via ORAL
  Filled 2018-02-13: qty 2

## 2018-02-13 MED ORDER — ENOXAPARIN SODIUM 30 MG/0.3ML ~~LOC~~ SOLN
30.0000 mg | SUBCUTANEOUS | Status: DC
  Administered 2018-02-13: 30 mg via SUBCUTANEOUS
  Filled 2018-02-13: qty 0.3

## 2018-02-13 MED ORDER — FUROSEMIDE 20 MG PO TABS
20.0000 mg | ORAL_TABLET | Freq: Two times a day (BID) | ORAL | Status: DC
  Administered 2018-02-13 – 2018-02-14 (×2): 20 mg via ORAL
  Filled 2018-02-13 (×2): qty 1

## 2018-02-13 NOTE — Consult Note (Signed)
[facility] Cardiology Consultation Note  Patient ID: [name], MRN: [id number], DOB/AGE: [birth year] 83 y.o. Admit date: 02/11/2018   Date of Consult: 02/13/2018 Primary Physician: Marguarite Arbour, MD Primary Cardiologis Gwen Pounds  Chief Complaint:  Chief Complaint  Patient presents with  . Wheezing    hx copd   Reason for Consult: HPI    Wheezing     Additional comments: hx copd       Last edited by Velna Hatchet, RN on 02/11/2018  1:04 PM. (History)    Heart failure  HPI: 84 y.o. female with known critical aortic valve stenosis over the last year with velocities of greater than 5 m/s and peak gradient into the 90 mm range with a mean gradient into the 50 mm range.  The patient has been treated with appropriate medication management for diastolic dysfunction heart failure and aortic stenosis as best possible.  This has worked although she has had some decrease in exercise tolerance and shortness of breath over time.  There was some long discussions previously about TAVR as well as a open aortic valve surgery and the patient as well as family wishes not to pursue.  She has had significant increase in the lower extremity edema shortness of breath weakness and fatigue over the last several days to a week and was admitted to the hospital due to acute on chronic diastolic dysfunction congestive heart failure likely secondary to severe and/or critical aortic valve stenosis.  The patient has had significant improvements with diuresis and medication management and currently is breathing much better.  Patient did have an echocardiogram continuing to show severe left ventricular hypertrophy and critical aortic valve stenosis with moderate to severe mitral regurgitation.  We have again addressed the concerns of the primary problem of aortic stenosis causing this issue and wishes to not pursue any further intervention  Past Medical History:  Diagnosis Date  . Acute diastolic CHF  (congestive heart failure) (HCC) 02/11/2018  . Anemia   . Anxiety   . Anxiety   . Aortic stenosis   . ASD (atrial septal defect)   . AVM (arteriovenous malformation)    pulmonary, s/p cautery  . Bronchitis   . Chronic obstruct airways disease (HCC)   . Depression   . Heart murmur   . HTN (hypertension)   . Hypothyroidism   . Osteoporosis, postmenopausal   . RA (rheumatoid arthritis) (HCC)   . Stroke [facility])       Surgical History:  Past Surgical History:  Procedure Laterality Date  . CHOLECYSTECTOMY    . FEMUR FRACTURE SURGERY    . FRACTURE SURGERY    . TOTAL ABDOMINAL HYSTERECTOMY       Home Meds: Prior to Admission medications   Medication Sig Start Date End Date Taking? Authorizing Provider  acetaminophen (TYLENOL) 325 MG tablet Take 2 tablets (650 mg total) by mouth every 6 (six) hours as needed for mild pain (or Fever >/= 101). Patient taking differently: Take 500 mg by mouth every 6 (six) hours as needed for mild pain (or Fever >/= 101).  04/29/15  Yes [name], Duane Lope, MD  ALPRAZolam Prudy Feeler) 0.25 MG tablet Take 1 tablet (0.25 mg total) by mouth 2 (two) times daily as needed for anxiety. Patient taking differently: Take 0.25 mg by mouth 2 (two) times daily as needed for anxiety or sleep.  04/29/15  Yes Marguarite Arbour, MD  aspirin EC 81 MG EC tablet Take 1 tablet (81 mg total) by mouth  daily. Patient taking differently: Take 81 mg by mouth at bedtime.  04/29/15  Yes Marguarite Arbour, MD  Calcium Carbonate-Vitamin D (CALCIUM-VITAMIN D) 500-200 MG-UNIT per tablet Take 1 tablet by mouth daily.   Yes [provider]  Cholecalciferol (VITAMIN D-3) 1000 UNITS CAPS Take 1 capsule by mouth daily.   Yes [provider]  feeding supplement, ENSURE ENLIVE, (ENSURE ENLIVE) LIQD Take 237 mLs by mouth daily. 07/11/15  Yes Marguarite Arbour, MD  guaiFENesin (MUCINEX) 600 MG 12 hr tablet Take 1 tablet (600 mg total) by mouth 2 (two) times daily. 04/29/15  Yes Marguarite Arbour, MD  ipratropium-albuterol (DUONEB) 0.5-2.5 (3) MG/3ML SOLN Take 3 mLs by nebulization 4 (four) times daily. 04/29/15  Yes Marguarite Arbour, MD  metoCLOPramide (REGLAN) 5 MG tablet Take 5 mg by mouth 3 (three) times daily before meals.   Yes [provider]  mirtazapine (REMERON) 30 MG tablet Take 30 mg by mouth at bedtime.   Yes [provider]  Multiple Vitamin (MULTIVITAMIN WITH MINERALS) TABS tablet Take 1 tablet by mouth daily.   Yes [provider]  pantoprazole (PROTONIX) 40 MG tablet Take 40 mg by mouth 2 (two) times daily.   Yes [provider]  senna-docusate (SENOKOT-S) 8.6-50 MG tablet Take 2 tablets by mouth daily.   Yes [provider]  SPIRIVA HANDIHALER 18 MCG inhalation capsule Place 1 capsule into inhaler and inhale daily.   Yes [provider]  antiseptic oral rinse (CPC / CETYLPYRIDINIUM CHLORIDE 0.05%) 0.05 % LIQD solution 7 mLs by Mouth Rinse route 2 times daily at 12 noon and 4 pm. Patient not taking: Reported on 02/11/2018 03/19/16   Katharina Caper, MD  budesonide (PULMICORT) 0.25 MG/2ML nebulizer solution Take 2 mLs (0.25 mg total) by nebulization 2 (two) times daily. Patient not taking: Reported on 02/11/2018 03/19/16   Katharina Caper, MD  losartan (COZAAR) 25 MG tablet Take 1 tablet (25 mg total) by mouth daily. Patient not taking: Reported on 02/11/2018 03/19/16   Katharina Caper, MD  ondansetron (ZOFRAN) 4 MG tablet Take 1 tablet (4 mg total) by mouth every 6 (six) hours as needed for nausea. Patient not taking: Reported on 03/12/2016 07/11/15   Marguarite Arbour, MD  polyethylene glycol [facility] / Ethelene Hal) packet Take 17 g by mouth daily as needed for mild constipation. Patient not taking: Reported on 07/10/2015 04/29/15   Marguarite Arbour, MD  predniSONE (STERAPRED UNI-PAK 21 TAB) 10 MG (21) TBPK tablet Take 1 tablet (10 mg total) by mouth daily. Please take 6 pills in the morning on the day 1 and 2, then taper by one pill  every 2 days until finished. Thank you Patient not taking: Reported on 02/11/2018 03/19/16   Katharina Caper, MD    Inpatient Medications:  . albuterol  2.5 mg Nebulization Q6H  . aspirin EC  81 mg Oral QHS  . calcium-vitamin D  1 tablet Oral Daily  . cholecalciferol  1,000 Units Oral Daily  . enoxaparin (LOVENOX) injection  40 mg Subcutaneous Q24H  . feeding supplement (ENSURE ENLIVE)  237 mL Oral Q24H  . furosemide  20 mg Intravenous Q12H  . lisinopril  5 mg Oral Daily  . methylPREDNISolone (SOLU-MEDROL) injection  40 mg Intravenous Q12H  . mirtazapine  30 mg Oral QHS  . pantoprazole  40 mg Oral BID AC  . potassium chloride  10 mEq Oral Daily  . senna-docusate  2 tablet Oral Daily  . sodium chloride  flush  3 mL Intravenous Q12H  . tiotropium  1 capsule Inhalation Daily   . sodium chloride      Allergies: No Known Allergies  Social History   Socioeconomic History  . Marital status: Widowed    Spouse name: Not on file  . Number of children: Not on file  . Years of education: Not on file  . Highest education level: Not on file  Occupational History  . Not on file  Social Needs  . Financial resource strain: Not on file  . Food insecurity:    Worry: Not on file    Inability: Not on file  . Transportation needs:    Medical: Not on file    Non-medical: Not on file  Tobacco Use  . Smoking status: Never Smoker  . Smokeless tobacco: Never Used  Substance and Sexual Activity  . Alcohol use: No    Alcohol/week: 0.0 oz  . Drug use: No  . Sexual activity: Not on file  Lifestyle  . Physical activity:    Days per week: Not on file    Minutes per session: Not on file  . Stress: Not on file  Relationships  . Social connections:    Talks on phone: Not on file    Gets together: Not on file    Attends religious service: Not on file    Active member of club or organization: Not on file    Attends meetings of clubs or organizations: Not on file    Relationship status: Not on  file  . Intimate partner violence:    Fear of current or ex partner: Not on file    Emotionally abused: Not on file    Physically abused: Not on file    Forced sexual activity: Not on file  Other Topics Concern  . Not on file  Social History Narrative  . Not on file     Family History  Problem Relation Age of Onset  . CAD Mother   . Stroke Mother   . Lung cancer Father   . Rheum arthritis Other      Review of Systems Positive for shortness of breath Negative for: General:  chills, fever, night sweats or weight changes.  Cardiovascular: PND orthopnea syncope dizziness  Dermatological skin lesions rashes Respiratory: Cough congestion Urologic: Frequent urination urination at night and hematuria Abdominal: negative for nausea, vomiting, diarrhea, bright red blood per rectum, melena, or hematemesis Neurologic: negative for visual changes, and/or hearing changes  All other systems reviewed and are otherwise negative except as noted above.  Labs: Recent Labs    02/11/18 1318  TROPONINI <0.03   Lab Results  Component Value Date   WBC 5.4 02/11/2018   HGB 12.2 02/11/2018   HCT 35.7 02/11/2018   MCV 108.8 (H) 02/11/2018   PLT 140 (L) 02/11/2018    Recent Labs  Lab 02/11/18 1318  02/13/18 0519  NA 134*   < > 140  K 3.6   < > 3.3*  CL 97*   < > 97*  CO2 32   < > 36*  BUN 12   < > 23*  CREATININE 0.80   < > 0.86  CALCIUM 8.6*   < > 8.7*  PROT 6.3*  --   --   BILITOT 0.6  --   --   ALKPHOS 66  --   --   ALT 20  --   --   AST 22  --   --  GLUCOSE 138*   < > 112*   < > = values in this interval not displayed.   No results found for: CHOL, HDL, LDLCALC, TRIG No results found for: DDIMER  Radiology/Studies:  Dg Chest 2 View  Result Date: 02/11/2018 CLINICAL DATA:  COPD.  Wheezing today. EXAM: CHEST - 2 VIEW COMPARISON:  Single-view of the chest 03/14/2016 and 0 5 04/28/2015. CT chest 04/29/2015. FINDINGS: There are small bilateral pleural effusions. Cardiomegaly  and interstitial edema are seen. Mild basilar atelectasis is noted. Aortic atherosclerosis is identified. IMPRESSION: Interstitial pulmonary edema with associated small bilateral pleural effusions. Cardiomegaly. Atherosclerosis. Electronically Signed   By: Drusilla Kanner M.D.   On: 02/11/2018 13:55    EKG: Normal sinus rhythm  Weights: Filed Weights   02/11/18 1802 02/12/18 0316 02/13/18 0332  Weight: 99 lb 11.2 oz (45.2 kg) 96 lb 4.8 oz (43.7 kg) 93 lb 6.4 oz (42.4 kg)     Physical Exam: Blood pressure 133/65, pulse 72, temperature 98.3 F (36.8 C), resp. rate 18, height 5\' 6"  (1.676 m), weight 93 lb 6.4 oz (42.4 kg), SpO2 97 %. Body mass index is 15.08 kg/m. General: Well developed, well nourished, in no acute distress. Head eyes ears nose throat: Normocephalic, atraumatic, sclera non-icteric, no xanthomas, nares are without discharge. No apparent thyromegaly and/or mass  Lungs: Normal respiratory effort.  Diffuse wheezes some basilar rales, no rhonchi.  Heart: RRR with normal S1 absent S2.  4+ right upper sternal border radiating through the murmur gallop, no rub, PMI is normal size and placement, carotid upstroke normal without bruit, jugular venous pressure is normal Abdomen: Soft, non-tender, non-distended with normoactive bowel sounds. No hepatomegaly. No rebound/guarding. No obvious abdominal masses. Abdominal aorta is normal size without bruit Extremities: No edema. no cyanosis, no clubbing, no ulcers  Peripheral : 2+ bilateral upper extremity pulses, 2+ bilateral femoral pulses, 2+ bilateral dorsal pedal pulse Neuro: Alert and oriented. No facial asymmetry. No focal deficit. Moves all extremities spontaneously. Musculoskeletal: Normal muscle tone without kyphosis Psych:  Responds to questions appropriately with a normal affect.    Assessment: 84 year old female with acute on chronic diastolic dysfunction congestive heart failure with severe left ventricular hypertrophy  secondary to critical aortic valve stenosis slightly improved with medication management  Plan: 1.  Continue diuresis with Lasix for further risk reduction of heart failure as well as concerns lower extremity edema 2.  Further cardiac diagnostics necessary at this time due to patient and family's wishes not to pursue any further intervention of aortic valve stenosis 3.  Begin ambulation and follow for improvements of symptoms 4.  Okay for discharge home if ambulating well with follow-up next week for further adjustments of medication management  Signed, Lamar Blinks M.D. [facility] Cardiology 02/13/2018, 8:44 AM

## 2018-02-13 NOTE — Progress Notes (Signed)
[employer] - [place] at [facility]                                                                                                                                                                                  Patient Demographics   [name], is a 84 y.o. female, DOB - Sep 23, 1928, OMV:[id number]  Admit date - 02/11/2018   Admitting Physician Shaune Pollack, MD  Outpatient Primary MD for the patient is Marguarite Arbour, MD   LOS - 2  Subjective: Patient feeling better still putting out good amount of urine  Review of Systems:   CONSTITUTIONAL: No documented fever. No fatigue, weakness. No weight gain, no weight loss.  EYES: No blurry or double vision.  ENT: No tinnitus. No postnasal drip. No redness of the oropharynx.  RESPIRATORY: No cough, no wheeze, no hemoptysis. positive dyspnea.  CARDIOVASCULAR: No chest pain. No orthopnea. No palpitations. No syncope.  GASTROINTESTINAL: No nausea, no vomiting or diarrhea. No abdominal pain. No melena or hematochezia.  GENITOURINARY: No dysuria or hematuria.  ENDOCRINE: No polyuria or nocturia. No heat or cold intolerance.  HEMATOLOGY: No anemia. No bruising. No bleeding.  INTEGUMENTARY: No rashes. No lesions.  MUSCULOSKELETAL: No arthritis. No swelling. No gout.  NEUROLOGIC: No numbness, tingling, or ataxia. No seizure-type activity.  PSYCHIATRIC: No anxiety. No insomnia. No ADD.    Vitals:   Vitals:   02/12/18 1930 02/13/18 0332 02/13/18 0726 02/13/18 0858  BP:  (!) 124/57 133/65   Pulse:  82 72   Resp:  18 18   Temp:  98.3 F (36.8 C) 98.3 F (36.8 C)   TempSrc:  Oral    SpO2: 94% 97% 97% 96%  Weight:  42.4 kg (93 lb 6.4 oz)    Height:        Wt Readings from Last 3 Encounters:  02/13/18 42.4 kg (93 lb 6.4 oz)  03/19/16 49.9 kg (110 lb)  07/11/15 48.9 kg (107 lb 14.4 oz)     Intake/Output Summary (Last 24 hours) at 02/13/2018 1356 Last data filed at 02/13/2018 1330 Gross per 24 hour  Intake 123 ml   Output 900 ml  Net -777 ml    Physical Exam:   GENERAL: Pleasant-appearing in no apparent distress.  HEAD, EYES, EARS, NOSE AND THROAT: Atraumatic, normocephalic. Extraocular muscles are intact. Pupils equal and reactive to light. Sclerae anicteric. No conjunctival injection. No oro-pharyngeal erythema.  NECK: Supple. There is no jugular venous distention. No bruits, no lymphadenopathy, no thyromegaly.  HEART: Regular rate and rhythm,.  Positive systolic murmurs, no rubs, no clicks.  LUNGS: Clear to auscultation bilaterally. No rales or rhonchi. No wheezes.  ABDOMEN: Soft, flat,  nontender, nondistended. Has good bowel sounds. No hepatosplenomegaly appreciated.  EXTREMITIES: No evidence of any cyanosis, clubbing, or peripheral edema.  +2 pedal and radial pulses bilaterally.  NEUROLOGIC: The patient is alert, awake, and oriented x3 with no focal motor or sensory deficits appreciated bilaterally.  SKIN: Moist and warm with no rashes appreciated.  Psych: Not anxious, depressed LN: No inguinal LN enlargement    Antibiotics   Anti-infectives (From admission, onward)   None      Medications   Scheduled Meds: . albuterol  2.5 mg Nebulization Q6H  . aspirin EC  81 mg Oral QHS  . calcium-vitamin D  1 tablet Oral Daily  . cholecalciferol  1,000 Units Oral Daily  . enoxaparin (LOVENOX) injection  30 mg Subcutaneous Q24H  . feeding supplement (ENSURE ENLIVE)  237 mL Oral Q24H  . furosemide  20 mg Oral BID  . lisinopril  5 mg Oral Daily  . methylPREDNISolone (SOLU-MEDROL) injection  40 mg Intravenous Q12H  . mirtazapine  30 mg Oral QHS  . pantoprazole  40 mg Oral BID AC  . potassium chloride  10 mEq Oral Daily  . potassium chloride  40 mEq Oral Once  . senna-docusate  2 tablet Oral Daily  . sodium chloride flush  3 mL Intravenous Q12H  . tiotropium  1 capsule Inhalation Daily   Continuous Infusions: . sodium chloride     PRN Meds:.sodium chloride, acetaminophen **OR**  acetaminophen, albuterol, ALPRAZolam, bisacodyl, HYDROcodone-acetaminophen, ketorolac, ondansetron **OR** ondansetron (ZOFRAN) IV, senna-docusate, sodium chloride flush   Data Review:   Micro Results No results found for this or any previous visit (from the past 240 hour(s)).  Radiology Reports Dg Chest 2 View  Result Date: 02/11/2018 CLINICAL DATA:  COPD.  Wheezing today. EXAM: CHEST - 2 VIEW COMPARISON:  Single-view of the chest 03/14/2016 and 0 5 04/28/2015. CT chest 04/29/2015. FINDINGS: There are small bilateral pleural effusions. Cardiomegaly and interstitial edema are seen. Mild basilar atelectasis is noted. Aortic atherosclerosis is identified. IMPRESSION: Interstitial pulmonary edema with associated small bilateral pleural effusions. Cardiomegaly. Atherosclerosis. Electronically Signed   By: Drusilla Kanner M.D.   On: 02/11/2018 13:55     CBC Recent Labs  Lab 02/11/18 1318  WBC 5.4  HGB 12.2  HCT 35.7  PLT 140*  MCV 108.8*  MCH 37.2*  MCHC 34.2  RDW 13.2  LYMPHSABS 1.5  MONOABS 0.2  EOSABS 0.8*  BASOSABS 0.1    Chemistries  Recent Labs  Lab 02/11/18 1318 02/12/18 0536 02/13/18 0519  NA 134* 138 140  K 3.6 3.6 3.3*  CL 97* 97* 97*  CO2 32 35* 36*  GLUCOSE 138* 117* 112*  BUN 12 14 23*  CREATININE 0.80 0.83 0.86  CALCIUM 8.6* 8.8* 8.7*  MG 2.0  --   --   AST 22  --   --   ALT 20  --   --   ALKPHOS 66  --   --   BILITOT 0.6  --   --    ------------------------------------------------------------------------------------------------------------------ estimated creatinine clearance is 30.8 mL/min (by C-G formula based on SCr of 0.86 mg/dL). ------------------------------------------------------------------------------------------------------------------ No results for input(s): HGBA1C in the last 72 hours. ------------------------------------------------------------------------------------------------------------------ No results for input(s): CHOL, HDL,  LDLCALC, TRIG, CHOLHDL, LDLDIRECT in the last 72 hours. ------------------------------------------------------------------------------------------------------------------ No results for input(s): TSH, T4TOTAL, T3FREE, THYROIDAB in the last 72 hours.  Invalid input(s): FREET3 ------------------------------------------------------------------------------------------------------------------ No results for input(s): VITAMINB12, FOLATE, FERRITIN, TIBC, IRON, RETICCTPCT in the last 72 hours.  Coagulation profile No results  for input(s): INR, PROTIME in the last 168 hours.  No results for input(s): DDIMER in the last 72 hours.  Cardiac Enzymes Recent Labs  Lab 02/11/18 1318  TROPONINI <0.03   ------------------------------------------------------------------------------------------------------------------ Invalid input(s): POCBNP    Assessment & Plan  Patient is a 84 year old white female with history of severe aortic stenosis presenting with shortness of breath  #1 acute respiratory failure with hypoxia due to acute on chronic diastolic CHF and COPD exacerbation.  #2 acute on chronic diastolic CHF continue low-change to oral Lasix  #3 acute on chronic COPD exacerbation continue therapy with nebulizer changed to oral prednisone  #4 critical aortic stenosis  Cardiology states that patient is not a candidate for any type of surgical intervention daughter is in agreement to take patient home tomorrow  #5 Hypokalemia potassium replaced  #6 previous history of CVA continue aspirin  #7 essential hypertension pressure currently stable  #8 status DNR        Code Status Orders  (From admission, onward)        Start     Ordered   02/11/18 1820  Do not attempt resuscitation (DNR)  Continuous    Question Answer Comment  In the event of cardiac or respiratory ARREST Do not call a "code blue"   In the event of cardiac or respiratory ARREST Do not perform Intubation, CPR,  defibrillation or ACLS   In the event of cardiac or respiratory ARREST Use medication by any route, position, wound care, and other measures to relive pain and suffering. May use oxygen, suction and manual treatment of airway obstruction as needed for comfort.      02/11/18 1819    Code Status History    Date Active Date Inactive Code Status Order ID Comments User Context   03/13/2016 1415 03/19/2016 1657 DNR [id number]  Merwyn Katos, MD Inpatient   03/12/2016 2159 03/13/2016 1415 Full Code [id number]  Lewie Loron, NP ED   07/10/2015 0543 07/11/2015 2135 Full Code [id number]  Crissie Figures, MD Inpatient   04/27/2015 2017 04/29/2015 1707 Full Code [id number]  Hower, Cletis Athens, MD ED    Advance Directive Documentation     Most Recent Value  Type of Advance Directive  Healthcare Power of Attorney  Pre-existing out of facility DNR order (yellow form or pink MOST form)  -  "MOST" Form in Place?  -           Consults cards  DVT Prophylaxis  lovenox  Lab Results  Component Value Date   PLT 140 (L) 02/11/2018     Time Spent in minutes    Greater than 50% of time spent in care coordination and counseling patient regarding the condition and plan of care.   Auburn Bilberry M.D on 02/13/2018 at 1:56 PM  Between 7am to 6pm - Pager - [phone]  After 6pm go to [email or web address] - Social research officer, government  [employer]   Office  [phone]

## 2018-02-13 NOTE — Progress Notes (Signed)
Anticoagulation monitoring(Lovenox):  84yo  F ordered Lovenox 40 mg Q24h  Filed Weights   02/11/18 1802 02/12/18 0316 02/13/18 0332  Weight: 99 lb 11.2 oz (45.2 kg) 96 lb 4.8 oz (43.7 kg) 93 lb 6.4 oz (42.4 kg)   BMI 15.08   Lab Results  Component Value Date   CREATININE 0.86 02/13/2018   CREATININE 0.83 02/12/2018   CREATININE 0.80 02/11/2018   Estimated Creatinine Clearance: 30.8 mL/min (by C-G formula based on SCr of 0.86 mg/dL). Hemoglobin & Hematocrit     Component Value Date/Time   HGB 12.2 02/11/2018 1318   HGB 11.7 (L) 02/06/2015 0533   HCT 35.7 02/11/2018 1318   HCT 35.4 02/06/2015 0533     Per Protocol for Patient with estCrcl > 30 ml/min and weight in a female < 45 kg will transition to Lovenox 30 mg Q24h     Bari Mantis PharmD Clinical Pharmacist 02/13/2018

## 2018-02-13 NOTE — Progress Notes (Signed)
Advanced care plan.  Purpose of the Encounter: CODE STATUS  Parties in Attendance: Patient and her daughter  Patient's Decision Capacity: Intact  Subjective/Patient's story: Patient is a 84 year old with critical re aortic stenosis, COPD and acute diastolic CHF admitted with CHF   Objective/Medical story I discussed home hospice services for the patient and strongly recommended patient be discharged with home hospice services.   Goals of care determination: Daughter to make decision regarding home hospice services    CODE STATUS: DNR   Time spent discussing advanced care planning: 16 minutes

## 2018-02-14 LAB — BASIC METABOLIC PANEL
ANION GAP: 6 (ref 5–15)
BUN: 23 mg/dL — AB (ref 6–20)
CHLORIDE: 95 mmol/L — AB (ref 101–111)
CO2: 38 mmol/L — AB (ref 22–32)
Calcium: 9.2 mg/dL (ref 8.9–10.3)
Creatinine, Ser: 0.94 mg/dL (ref 0.44–1.00)
GFR calc Af Amer: >60 mL/min (ref >60)
GFR, EST NON AFRICAN AMERICAN: 53 mL/min — AB (ref >60)
GLUCOSE: 116 mg/dL — AB (ref 65–99)
POTASSIUM: 3.8 mmol/L (ref 3.5–5.1)
Sodium: 139 mmol/L (ref 135–145)

## 2018-02-14 MED ORDER — MORPHINE SULFATE 20 MG/5ML PO SOLN: 2.5000 mg | ORAL | 0 refills | Status: AC | PRN

## 2018-02-14 MED ORDER — FUROSEMIDE 20 MG PO TABS: 20.0000 mg | ORAL_TABLET | Freq: Two times a day (BID) | ORAL | 0 refills | Status: AC

## 2018-02-14 NOTE — Progress Notes (Signed)
Patient is discharge in a stable condition, summary and f/u care given to patient and daughter , verbalized understanding .

## 2018-02-14 NOTE — Discharge Summary (Signed)
[employer] - [place] at [facility], 84 y.o., DOB 1928-09-24, MRN [id number]. Admission date: 02/11/2018 Discharge Date 02/14/2018 Primary MD Marguarite Arbour, MD Admitting Physician Shaune Pollack, MD  Admission Diagnosis  Pulmonary edema, acute [facility]) [J81.0] Acute congestive heart failure, unspecified heart failure type [facility]) [I50.9]  Discharge Diagnosis   Active Problems: Acute respiratory failure with hypoxia Acute on chronic diastolic critical aortic stenosis Acute on chronic COPD exasperation Hypokalemia Previous history of CVA Essential hypertension  Hospital Course  Patient's 84 year old white female with history of severe aortic stenosis presented with shortness of breath.  Patient was noted to have acute diastolic CHF which was treated with Lasix.  She also was thought to have some COPD exasperation.  Which was treated.  Patient's breathing is much improved.  Patient does have a history of aortic stenosis which echo now shows critical aortic stenosis.  Further discussions were held with the family and patient now being discharged home with hospice to follow at home.  Prognosis very poor         Consults  cardiology  Significant Tests:  See full reports for all details     Dg Chest 2 View  Result Date: 02/11/2018 CLINICAL DATA:  COPD.  Wheezing today. EXAM: CHEST - 2 VIEW COMPARISON:  Single-view of the chest 03/14/2016 and 0 5 04/28/2015. CT chest 04/29/2015. FINDINGS: There are small bilateral pleural effusions. Cardiomegaly and interstitial edema are seen. Mild basilar atelectasis is noted. Aortic atherosclerosis is identified. IMPRESSION: Interstitial pulmonary edema with associated small bilateral pleural effusions. Cardiomegaly. Atherosclerosis. Electronically Signed   By: Drusilla Kanner M.D.   On: 02/11/2018 13:55       Today   Subjective:   [name]  Pt breathing improved  Objective:   Blood pressure 118/60, pulse  85, temperature 98.4 F (36.9 C), temperature source Oral, resp. rate 20, height 5\' 6"  (1.676 m), weight 39.9 kg (87 lb 14.4 oz), SpO2 96 %.  .  Intake/Output Summary (Last 24 hours) at 02/14/2018 1342 Last data filed at 02/14/2018 1005 Gross per 24 hour  Intake 720 ml  Output 1450 ml  Net -730 ml    Exam VITAL SIGNS: Blood pressure 118/60, pulse 85, temperature 98.4 F (36.9 C), temperature source Oral, resp. rate 20, height 5\' 6"  (1.676 m), weight 39.9 kg (87 lb 14.4 oz), SpO2 96 %.  GENERAL:  84 y.o.-year-old patient lying in the bed with no acute distress.  EYES: Pupils equal, round, reactive to light and accommodation. No scleral icterus. Extraocular muscles intact.  HEENT: Head atraumatic, normocephalic. Oropharynx and nasopharynx clear.  NECK:  Supple, no jugular venous distention. No thyroid enlargement, no tenderness.  LUNGS: Normal breath sounds bilaterally, no wheezing, rales,rhonchi or crepitation. No use of accessory muscles of respiration.  CARDIOVASCULAR: S1, S2 normal.  Positive systolic murmurs, rubs, or gallops.  ABDOMEN: Soft, nontender, nondistended. Bowel sounds present. No organomegaly or mass.  EXTREMITIES: No pedal edema, cyanosis, or clubbing.  NEUROLOGIC: Cranial nerves II through XII are intact. Muscle strength 5/5 in all extremities. Sensation intact. Gait not checked.  PSYCHIATRIC: The patient is alert and oriented x 3.  SKIN: No obvious rash, lesion, or ulcer.   Data Review     CBC w Diff:  Lab Results  Component Value Date   WBC 5.4 02/11/2018   HGB 12.2 02/11/2018   HGB 11.7 (L) 02/06/2015   HCT 35.7 02/11/2018   HCT 35.4 02/06/2015   PLT 140 (L) 02/11/2018  PLT 147 (L) 02/06/2015   LYMPHOPCT 28 02/11/2018   LYMPHOPCT 24.1 02/06/2015   MONOPCT 4 02/11/2018   MONOPCT 5.8 02/06/2015   EOSPCT 15 02/11/2018   EOSPCT 1.9 02/06/2015   BASOPCT 1 02/11/2018   BASOPCT 0.3 02/06/2015   CMP:  Lab Results  Component Value Date   NA 139  02/14/2018   NA 142 02/06/2015   K 3.8 02/14/2018   K 3.5 02/06/2015   CL 95 (L) 02/14/2018   CL 103 02/06/2015   CO2 38 (H) 02/14/2018   CO2 31 02/06/2015   BUN 23 (H) 02/14/2018   BUN 19 02/06/2015   CREATININE 0.94 02/14/2018   CREATININE 0.98 02/06/2015   PROT 6.3 (L) 02/11/2018   PROT 7.1 04/06/2014   ALBUMIN 3.7 02/11/2018   ALBUMIN 3.2 (L) 04/06/2014   BILITOT 0.6 02/11/2018   BILITOT 0.3 04/06/2014   ALKPHOS 66 02/11/2018   ALKPHOS 78 04/06/2014   AST 22 02/11/2018   AST 24 04/06/2014   ALT 20 02/11/2018   ALT 12 04/06/2014  .  Micro Results No results found for this or any previous visit (from the past 240 hour(s)).      Code Status Orders  (From admission, onward)        Start     Ordered   02/11/18 1820  Do not attempt resuscitation (DNR)  Continuous    Question Answer Comment  In the event of cardiac or respiratory ARREST Do not call a "code blue"   In the event of cardiac or respiratory ARREST Do not perform Intubation, CPR, defibrillation or ACLS   In the event of cardiac or respiratory ARREST Use medication by any route, position, wound care, and other measures to relive pain and suffering. May use oxygen, suction and manual treatment of airway obstruction as needed for comfort.      02/11/18 1819    Code Status History    Date Active Date Inactive Code Status Order ID Comments User Context   03/13/2016 1415 03/19/2016 1657 DNR [id number]  Merwyn Katos, MD Inpatient   03/12/2016 2159 03/13/2016 1415 Full Code [id number]  Lewie Loron, NP ED   07/10/2015 0543 07/11/2015 2135 Full Code [id number]  Crissie Figures, MD Inpatient   04/27/2015 2017 04/29/2015 1707 Full Code [id number]  Hower, Cletis Athens, MD ED    Advance Directive Documentation     Most Recent Value  Type of Advance Directive  Healthcare Power of Attorney  Pre-existing out of facility DNR order (yellow form or pink MOST form)  -  "MOST" Form in Place?  -          Follow-up  Information    [facility] Follow up on 02/24/2018.   Specialty:  Cardiology Why:  at 11:40am Contact information: [address] [place] [address] [phone]       Ignacia Bayley, Cordelia Poche On 01/31/2018.   Specialty:  Physician Assistant Why:  Appointment Time: 1:30, Please arrive 15 minutes prior to appointment! Thank you! Contact information: [address] [facility] [place] [address] [phone]           Discharge Medications   Allergies as of 02/14/2018   No Known Allergies     Medication List    STOP taking these medications   metoCLOPramide 5 MG tablet Commonly known as:  REGLAN   ondansetron 4 MG tablet Commonly known as:  ZOFRAN   predniSONE 10  MG (21) Tbpk tablet Commonly known as:  STERAPRED UNI-PAK 21 TAB     TAKE these medications   acetaminophen 325 MG tablet Commonly known as:  TYLENOL Take 2 tablets (650 mg total) by mouth every 6 (six) hours as needed for mild pain (or Fever >/= 101). What changed:  how much to take   ALPRAZolam 0.25 MG tablet Commonly known as:  XANAX Take 1 tablet (0.25 mg total) by mouth 2 (two) times daily as needed for anxiety. What changed:  reasons to take this   antiseptic oral rinse 0.05 % Liqd solution Commonly known as:  CPC / CETYLPYRIDINIUM CHLORIDE 0.05% 7 mLs by Mouth Rinse route 2 times daily at 12 noon and 4 pm.   aspirin 81 MG EC tablet Take 1 tablet (81 mg total) by mouth daily. What changed:  when to take this   budesonide 0.25 MG/2ML nebulizer solution Commonly known as:  PULMICORT Take 2 mLs (0.25 mg total) by nebulization 2 (two) times daily.   calcium-vitamin D 500-200 MG-UNIT tablet Take 1 tablet by mouth daily.   feeding supplement (ENSURE ENLIVE) Liqd Take 237 mLs by mouth daily.   furosemide 20 MG tablet Commonly known as:  LASIX Take 1 tablet (20 mg total) by mouth 2 (two) times  daily.   guaiFENesin 600 MG 12 hr tablet Commonly known as:  MUCINEX Take 1 tablet (600 mg total) by mouth 2 (two) times daily.   ipratropium-albuterol 0.5-2.5 (3) MG/3ML Soln Commonly known as:  DUONEB Take 3 mLs by nebulization 4 (four) times daily.   losartan 25 MG tablet Commonly known as:  COZAAR Take 1 tablet (25 mg total) by mouth daily.   mirtazapine 30 MG tablet Commonly known as:  REMERON Take 30 mg by mouth at bedtime.   morphine 20 MG/5ML solution Take 0.6 mLs (2.4 mg total) by mouth every 2 (two) hours as needed for pain.   multivitamin with minerals Tabs tablet Take 1 tablet by mouth daily.   pantoprazole 40 MG tablet Commonly known as:  PROTONIX Take 40 mg by mouth 2 (two) times daily.   polyethylene glycol packet Commonly known as:  MIRALAX / GLYCOLAX Take 17 g by mouth daily as needed for mild constipation.   senna-docusate 8.6-50 MG tablet Commonly known as:  Senokot-S Take 2 tablets by mouth daily.   SPIRIVA HANDIHALER 18 MCG inhalation capsule Generic drug:  tiotropium Place 1 capsule into inhaler and inhale daily.   Vitamin D-3 1000 units Caps Take 1 capsule by mouth daily.            Durable Medical Equipment  (From admission, onward)        Start     Ordered   02/14/18 0951  DME Oxygen  Once    Question Answer Comment  Mode or (Route) Nasal cannula   Liters per Minute 2   Frequency Continuous (stationary and portable oxygen unit needed)   Oxygen delivery system Gas      02/14/18 0953         Total Time in preparing paper work, data evaluation and todays exam - 35 minutes  Auburn Bilberry M.D on 02/14/2018 at 1:42 PM [employer]   Office  [phone]

## 2018-02-14 NOTE — Care Management (Signed)
Patient for discharge home today with hospice services.  Agency preference is [employer]. To travel by car.  Has hospital bed "that medicare paid for and now patient owns it", walker, BSC and wheelchair. Will need oxygen.  Contacted Estate manager/land agent.

## 2018-02-14 NOTE — Care Management Important Message (Signed)
Copy of signed IM left in patient's room.    

## 2018-02-14 NOTE — Progress Notes (Signed)
New referral for [facility] services at home received from [facility]. Patient is an 84 year old woman with a known history of  Diastolic CHF, aortic stenosis, HTN, COPD, CVA, Hypothyroidism and AVM admitted to [facility] on 4/20 from home with increased shortness of breath, cough and wheezing. Chest xray revealed pulmonary edema with a small pleural effusion. Cardiology was consulted and she was treated with IV lasix. Per chart note review family has chosen not to pursue any further aggressive treatment.  Attending physician Dr. Posey Pronto spoke with family this morning and they have chosen to have patient return home with the support of hospice services. Writer met in the room with patient, her daughter [name] and son in law Lonell Face to initiate education regarding hospice services, philosophy and team approach to care with good understanding voiced. Patient was alert and up in the chair, she is very hard of hearing and did not participate in the conversation. Oxygen will be needed to be in place prior to discharge. Patient has been on 2 liters since admission. Oxygen removed during visit and saturations checked after 15 minutes, sats remained at 94 %. Patient's daughter would like to take her mother home via car and does not feel patient needs oxygen tank for transport. Staff RN Vernie Shanks and CMRN Joni Reining made aware. Patient information faxed to referral. Hospital care team updated.Signed DNR to accompany patient home.   Flo Shanks RN, BSN, [place] [facility], hospital Liaison [phone]

## 2018-02-14 NOTE — Plan of Care (Signed)
  Problem: Education: Goal: Knowledge of General Education information will improve Outcome: Progressing   Problem: Health Behavior/Discharge Planning: Goal: Ability to manage health-related needs will improve Outcome: Progressing   Problem: Clinical Measurements: Goal: Ability to maintain clinical measurements within normal limits will improve Outcome: Progressing Goal: Will remain free from infection Outcome: Progressing Goal: Diagnostic test results will improve Outcome: Progressing Goal: Respiratory complications will improve Outcome: Progressing Goal: Cardiovascular complication will be avoided Outcome: Progressing   Problem: Activity: Goal: Risk for activity intolerance will decrease Outcome: Progressing   Problem: Nutrition: Goal: Adequate nutrition will be maintained Outcome: Progressing   Problem: Coping: Goal: Level of anxiety will decrease Outcome: Progressing   Problem: Elimination: Goal: Will not experience complications related to bowel motility Outcome: Progressing Goal: Will not experience complications related to urinary retention Outcome: Progressing   Problem: Pain Managment: Goal: General experience of comfort will improve Outcome: Progressing   Problem: Safety: Goal: Ability to remain free from injury will improve Outcome: Progressing   Problem: Skin Integrity: Goal: Risk for impaired skin integrity will decrease Outcome: Progressing   Problem: Education: Goal: Ability to demonstrate management of disease process will improve Outcome: Progressing Goal: Ability to verbalize understanding of medication therapies will improve Outcome: Progressing   Problem: Activity: Goal: Capacity to carry out activities will improve Outcome: Progressing   Problem: Cardiac: Goal: Ability to achieve and maintain adequate cardiopulmonary perfusion will improve Outcome: Progressing   

## 2018-02-20 ENCOUNTER — Emergency Department: Payer: Medicare Other

## 2018-02-20 ENCOUNTER — Other Ambulatory Visit: Payer: Self-pay

## 2018-02-20 ENCOUNTER — Inpatient Hospital Stay
Admission: EM | Admit: 2018-02-20 | Discharge: 2018-03-25 | DRG: 190 | Disposition: E | Payer: Medicare Other | Attending: Internal Medicine | Admitting: Internal Medicine

## 2018-02-20 DIAGNOSIS — F0390 Unspecified dementia without behavioral disturbance: Secondary | ICD-10-CM | POA: Diagnosis present

## 2018-02-20 DIAGNOSIS — M069 Rheumatoid arthritis, unspecified: Secondary | ICD-10-CM | POA: Diagnosis present

## 2018-02-20 DIAGNOSIS — Z7189 Other specified counseling: Secondary | ICD-10-CM | POA: Diagnosis not present

## 2018-02-20 DIAGNOSIS — J9621 Acute and chronic respiratory failure with hypoxia: Secondary | ICD-10-CM | POA: Diagnosis not present

## 2018-02-20 DIAGNOSIS — Q211 Atrial septal defect: Secondary | ICD-10-CM | POA: Diagnosis not present

## 2018-02-20 DIAGNOSIS — Z9049 Acquired absence of other specified parts of digestive tract: Secondary | ICD-10-CM

## 2018-02-20 DIAGNOSIS — E871 Hypo-osmolality and hyponatremia: Secondary | ICD-10-CM | POA: Diagnosis present

## 2018-02-20 DIAGNOSIS — I110 Hypertensive heart disease with heart failure: Secondary | ICD-10-CM | POA: Diagnosis present

## 2018-02-20 DIAGNOSIS — I5032 Chronic diastolic (congestive) heart failure: Secondary | ICD-10-CM | POA: Diagnosis present

## 2018-02-20 DIAGNOSIS — Z8673 Personal history of transient ischemic attack (TIA), and cerebral infarction without residual deficits: Secondary | ICD-10-CM | POA: Diagnosis not present

## 2018-02-20 DIAGNOSIS — E039 Hypothyroidism, unspecified: Secondary | ICD-10-CM | POA: Diagnosis present

## 2018-02-20 DIAGNOSIS — R011 Cardiac murmur, unspecified: Secondary | ICD-10-CM | POA: Diagnosis present

## 2018-02-20 DIAGNOSIS — Z823 Family history of stroke: Secondary | ICD-10-CM | POA: Diagnosis not present

## 2018-02-20 DIAGNOSIS — F419 Anxiety disorder, unspecified: Secondary | ICD-10-CM | POA: Diagnosis present

## 2018-02-20 DIAGNOSIS — K219 Gastro-esophageal reflux disease without esophagitis: Secondary | ICD-10-CM | POA: Diagnosis present

## 2018-02-20 DIAGNOSIS — Z515 Encounter for palliative care: Secondary | ICD-10-CM

## 2018-02-20 DIAGNOSIS — Z66 Do not resuscitate: Secondary | ICD-10-CM | POA: Diagnosis present

## 2018-02-20 DIAGNOSIS — Z8249 Family history of ischemic heart disease and other diseases of the circulatory system: Secondary | ICD-10-CM | POA: Diagnosis not present

## 2018-02-20 DIAGNOSIS — Z801 Family history of malignant neoplasm of trachea, bronchus and lung: Secondary | ICD-10-CM | POA: Diagnosis not present

## 2018-02-20 DIAGNOSIS — I080 Rheumatic disorders of both mitral and aortic valves: Secondary | ICD-10-CM | POA: Diagnosis present

## 2018-02-20 DIAGNOSIS — J441 Chronic obstructive pulmonary disease with (acute) exacerbation: Secondary | ICD-10-CM | POA: Diagnosis present

## 2018-02-20 DIAGNOSIS — M810 Age-related osteoporosis without current pathological fracture: Secondary | ICD-10-CM | POA: Diagnosis present

## 2018-02-20 DIAGNOSIS — Z7982 Long term (current) use of aspirin: Secondary | ICD-10-CM | POA: Diagnosis not present

## 2018-02-20 DIAGNOSIS — Z90710 Acquired absence of both cervix and uterus: Secondary | ICD-10-CM

## 2018-02-20 DIAGNOSIS — I350 Nonrheumatic aortic (valve) stenosis: Secondary | ICD-10-CM | POA: Diagnosis not present

## 2018-02-20 DIAGNOSIS — J9620 Acute and chronic respiratory failure, unspecified whether with hypoxia or hypercapnia: Secondary | ICD-10-CM | POA: Diagnosis present

## 2018-02-20 LAB — CBC WITH DIFFERENTIAL/PLATELET
BASOS ABS: 0 10*3/uL (ref 0–0.1)
Basophils Relative: 1 %
EOS PCT: 8 %
Eosinophils Absolute: 0.6 10*3/uL (ref 0–0.7)
HCT: 38.4 % (ref 35.0–47.0)
Hemoglobin: 13.3 g/dL (ref 12.0–16.0)
Lymphocytes Relative: 13 %
Lymphs Abs: 0.9 10*3/uL — ABNORMAL LOW (ref 1.0–3.6)
MCH: 37.1 pg — ABNORMAL HIGH (ref 26.0–34.0)
MCHC: 34.7 g/dL (ref 32.0–36.0)
MCV: 106.9 fL — ABNORMAL HIGH (ref 80.0–100.0)
Monocytes Absolute: 0.3 10*3/uL (ref 0.2–0.9)
Monocytes Relative: 5 %
Neutro Abs: 5.3 10*3/uL (ref 1.4–6.5)
Neutrophils Relative %: 73 %
PLATELETS: 137 10*3/uL — AB (ref 150–440)
RBC: 3.59 MIL/uL — AB (ref 3.80–5.20)
RDW: 13 % (ref 11.5–14.5)
WBC: 7.2 10*3/uL (ref 3.6–11.0)

## 2018-02-20 LAB — COMPREHENSIVE METABOLIC PANEL
ALT: 18 U/L (ref 14–54)
AST: 29 U/L (ref 15–41)
Albumin: 4.3 g/dL (ref 3.5–5.0)
Alkaline Phosphatase: 73 U/L (ref 38–126)
Anion gap: 10 (ref 5–15)
BUN: 18 mg/dL (ref 6–20)
CHLORIDE: 88 mmol/L — AB (ref 101–111)
CO2: 29 mmol/L (ref 22–32)
Calcium: 9 mg/dL (ref 8.9–10.3)
Creatinine, Ser: 0.89 mg/dL (ref 0.44–1.00)
GFR calc Af Amer: >60 mL/min (ref >60)
GFR, EST NON AFRICAN AMERICAN: 57 mL/min — AB (ref >60)
Glucose, Bld: 166 mg/dL — ABNORMAL HIGH (ref 65–99)
POTASSIUM: 4 mmol/L (ref 3.5–5.1)
Sodium: 127 mmol/L — ABNORMAL LOW (ref 135–145)
Total Bilirubin: 0.4 mg/dL (ref 0.3–1.2)
Total Protein: 7.3 g/dL (ref 6.5–8.1)

## 2018-02-20 LAB — TROPONIN I: TROPONIN I: 0.03 ng/mL — AB (ref <0.03)

## 2018-02-20 MED ORDER — IPRATROPIUM-ALBUTEROL 0.5-2.5 (3) MG/3ML IN SOLN
3.0000 mL | Freq: Once | RESPIRATORY_TRACT | Status: AC
  Administered 2018-02-20: 3 mL via RESPIRATORY_TRACT
  Filled 2018-02-20: qty 3

## 2018-02-20 MED ORDER — CALCIUM CARBONATE-VITAMIN D 500-200 MG-UNIT PO TABS
1.0000 | ORAL_TABLET | Freq: Every day | ORAL | Status: DC
  Administered 2018-02-21: 1 via ORAL
  Filled 2018-02-20: qty 1

## 2018-02-20 MED ORDER — TIOTROPIUM BROMIDE MONOHYDRATE 18 MCG IN CAPS
1.0000 | ORAL_CAPSULE | Freq: Every day | RESPIRATORY_TRACT | Status: DC
  Administered 2018-02-21: 18 ug via RESPIRATORY_TRACT
  Filled 2018-02-20: qty 5

## 2018-02-20 MED ORDER — SODIUM CHLORIDE 0.9 % IV SOLN
Freq: Once | INTRAVENOUS | Status: AC
  Administered 2018-02-20: 20:00:00 via INTRAVENOUS

## 2018-02-20 MED ORDER — METHYLPREDNISOLONE SODIUM SUCC 125 MG IJ SOLR
125.0000 mg | Freq: Once | INTRAMUSCULAR | Status: AC
  Administered 2018-02-20: 125 mg via INTRAVENOUS
  Filled 2018-02-20: qty 2

## 2018-02-20 MED ORDER — BUDESONIDE 0.25 MG/2ML IN SUSP
0.2500 mg | Freq: Two times a day (BID) | RESPIRATORY_TRACT | Status: DC
  Administered 2018-02-21 – 2018-02-22 (×3): 0.25 mg via RESPIRATORY_TRACT
  Filled 2018-02-20 (×3): qty 2

## 2018-02-20 MED ORDER — ALPRAZOLAM 0.25 MG PO TABS
0.2500 mg | ORAL_TABLET | Freq: Two times a day (BID) | ORAL | Status: DC | PRN
  Administered 2018-02-20: 0.25 mg via ORAL
  Filled 2018-02-20: qty 1

## 2018-02-20 MED ORDER — DOCUSATE SODIUM 100 MG PO CAPS: 100.0000 mg | ORAL_CAPSULE | Freq: Two times a day (BID) | ORAL | Status: DC | PRN

## 2018-02-20 MED ORDER — MORPHINE SULFATE (CONCENTRATE) 10 MG/0.5ML PO SOLN
2.5000 mg | ORAL | Status: DC | PRN
  Administered 2018-02-21: 2.6 mg via ORAL
  Filled 2018-02-20: qty 1

## 2018-02-20 MED ORDER — VITAMIN D 1000 UNITS PO TABS
1000.0000 [IU] | ORAL_TABLET | Freq: Every day | ORAL | Status: DC
  Administered 2018-02-21: 1000 [IU] via ORAL
  Filled 2018-02-20: qty 1

## 2018-02-20 MED ORDER — MIRTAZAPINE 15 MG PO TABS
30.0000 mg | ORAL_TABLET | Freq: Every day | ORAL | Status: DC
  Administered 2018-02-20 – 2018-02-21 (×2): 30 mg via ORAL
  Filled 2018-02-20 (×2): qty 2

## 2018-02-20 MED ORDER — IPRATROPIUM-ALBUTEROL 0.5-2.5 (3) MG/3ML IN SOLN
3.0000 mL | Freq: Four times a day (QID) | RESPIRATORY_TRACT | Status: DC
  Administered 2018-02-20: 3 mL via RESPIRATORY_TRACT
  Filled 2018-02-20: qty 3

## 2018-02-20 MED ORDER — ADULT MULTIVITAMIN W/MINERALS CH
1.0000 | ORAL_TABLET | Freq: Every day | ORAL | Status: DC
  Administered 2018-02-21: 1 via ORAL
  Filled 2018-02-20: qty 1

## 2018-02-20 MED ORDER — IPRATROPIUM-ALBUTEROL 0.5-2.5 (3) MG/3ML IN SOLN
3.0000 mL | Freq: Four times a day (QID) | RESPIRATORY_TRACT | Status: DC
  Administered 2018-02-21: 3 mL via RESPIRATORY_TRACT
  Filled 2018-02-20: qty 3

## 2018-02-20 MED ORDER — SENNOSIDES-DOCUSATE SODIUM 8.6-50 MG PO TABS
2.0000 | ORAL_TABLET | Freq: Every day | ORAL | Status: DC
  Administered 2018-02-21: 2 via ORAL
  Filled 2018-02-20: qty 2

## 2018-02-20 MED ORDER — ASPIRIN EC 81 MG PO TBEC
81.0000 mg | DELAYED_RELEASE_TABLET | Freq: Every day | ORAL | Status: DC
  Administered 2018-02-21: 81 mg via ORAL
  Filled 2018-02-20: qty 1

## 2018-02-20 MED ORDER — METHYLPREDNISOLONE SODIUM SUCC 125 MG IJ SOLR
60.0000 mg | Freq: Four times a day (QID) | INTRAMUSCULAR | Status: DC
  Administered 2018-02-21 (×2): 60 mg via INTRAVENOUS
  Filled 2018-02-20 (×2): qty 2

## 2018-02-20 MED ORDER — ENSURE ENLIVE PO LIQD: 237.0000 mL | ORAL | Status: DC

## 2018-02-20 MED ORDER — HEPARIN SODIUM (PORCINE) 5000 UNIT/ML IJ SOLN: 5000.0000 [IU] | Freq: Three times a day (TID) | INTRAMUSCULAR | Status: DC

## 2018-02-20 MED ORDER — GUAIFENESIN ER 600 MG PO TB12
600.0000 mg | ORAL_TABLET | Freq: Two times a day (BID) | ORAL | Status: DC
  Administered 2018-02-20 – 2018-02-21 (×3): 600 mg via ORAL
  Filled 2018-02-20 (×3): qty 1

## 2018-02-20 MED ORDER — SODIUM CHLORIDE 0.9 % IV SOLN
INTRAVENOUS | Status: DC
  Administered 2018-02-21 (×2): via INTRAVENOUS

## 2018-02-20 MED ORDER — PANTOPRAZOLE SODIUM 40 MG PO TBEC
40.0000 mg | DELAYED_RELEASE_TABLET | Freq: Two times a day (BID) | ORAL | Status: DC
  Administered 2018-02-20 – 2018-02-21 (×3): 40 mg via ORAL
  Filled 2018-02-20 (×3): qty 1

## 2018-02-20 MED ORDER — BUDESONIDE 0.25 MG/2ML IN SUSP
0.2500 mg | Freq: Two times a day (BID) | RESPIRATORY_TRACT | Status: DC
  Administered 2018-02-20: 0.25 mg via RESPIRATORY_TRACT
  Filled 2018-02-20: qty 2

## 2018-02-20 NOTE — H&P (Signed)
[employer] [facility] - [place] [facility]   PATIENT NAME: [name]    MR#:  [id number]  DATE OF BIRTH:  08/31/1928  DATE OF ADMISSION:  Feb 25, 2018  PRIMARY CARE PHYSICIAN: Marguarite Arbour, MD   REQUESTING/REFERRING PHYSICIAN: [email or web address]  CHIEF COMPLAINT:   Chief Complaint  Patient presents with  . Shortness of Breath    HISTORY OF PRESENT ILLNESS: [name]  is a 84 y.o. female with a known history of acute diastolic congestive heart failure, severe aortic stenosis, anemia, anxiety, atrial septal defect, AV malformations, COPD, depression, hypertension, hypothyroidism, osteoporosis, stroke- was admitted to hospital last week for CHF exacerbation, after meeting with [facility] and improvement in condition patient was sent home with hospice to follow at home. As per patient's daughter, hospice nurse came at home and arrange for her oxygen, but as per her patient was stable and was not ready for admission to hospice. Today she went to her primary care physician's appointment, where she had some shortness of breath and they gave her some nebulizer treatment and she felt better. Again in the afternoon she had significant worsening shortness of breath with wheezing and daughter gave multiple nebulizer treatments and home oxygen therapy, but it did not help so finally she called EMS and brought her to emergency room.  PAST MEDICAL HISTORY:   Past Medical History:  Diagnosis Date  . Acute diastolic CHF (congestive heart failure) (HCC) 02/11/2018  . Anemia   . Anxiety   . Anxiety   . Aortic stenosis   . ASD (atrial septal defect)   . AVM (arteriovenous malformation)    pulmonary, s/p cautery  . Bronchitis   . Chronic obstruct airways disease (HCC)   . Depression   . Heart murmur   . HTN (hypertension)   . Hypothyroidism   . Osteoporosis, postmenopausal   . RA (rheumatoid arthritis) (HCC)   . Stroke [facility])     PAST SURGICAL HISTORY:  Past Surgical History:   Procedure Laterality Date  . CHOLECYSTECTOMY    . FEMUR FRACTURE SURGERY    . FRACTURE SURGERY    . TOTAL ABDOMINAL HYSTERECTOMY      SOCIAL HISTORY:  Social History   Tobacco Use  . Smoking status: Never Smoker  . Smokeless tobacco: Never Used  Substance Use Topics  . Alcohol use: No    Alcohol/week: 0.0 oz    FAMILY HISTORY:  Family History  Problem Relation Age of Onset  . CAD Mother   . Stroke Mother   . Lung cancer Father   . Rheum arthritis Other     DRUG ALLERGIES: No Known Allergies  REVIEW OF SYSTEMS:   Patient has some dementia and not giving me much history or review of system, she also have some hearing deficit and her daughter gives me all history and speak for the patient.  MEDICATIONS AT HOME:  Prior to Admission medications   Medication Sig Start Date End Date Taking? Authorizing Provider  acetaminophen (TYLENOL) 325 MG tablet Take 2 tablets (650 mg total) by mouth every 6 (six) hours as needed for mild pain (or Fever >/= 101). Patient taking differently: Take 500 mg by mouth every 6 (six) hours as needed for mild pain (or Fever >/= 101).  04/29/15   [name], Duane Lope, MD  ALPRAZolam Prudy Feeler) 0.25 MG tablet Take 1 tablet (0.25 mg total) by mouth 2 (two) times daily as needed for anxiety. Patient taking differently: Take 0.25 mg by mouth 2 (two) times daily as  needed for anxiety or sleep.  04/29/15   Marguarite Arbour, MD  antiseptic oral rinse (CPC / CETYLPYRIDINIUM CHLORIDE 0.05%) 0.05 % LIQD solution 7 mLs by Mouth Rinse route 2 times daily at 12 noon and 4 pm. Patient not taking: Reported on 02/11/2018 03/19/16   Katharina Caper, MD  aspirin EC 81 MG EC tablet Take 1 tablet (81 mg total) by mouth daily. Patient taking differently: Take 81 mg by mouth at bedtime.  04/29/15   Marguarite Arbour, MD  budesonide (PULMICORT) 0.25 MG/2ML nebulizer solution Take 2 mLs (0.25 mg total) by nebulization 2 (two) times daily. Patient not taking: Reported on 02/11/2018 03/19/16    Katharina Caper, MD  Calcium Carbonate-Vitamin D (CALCIUM-VITAMIN D) 500-200 MG-UNIT per tablet Take 1 tablet by mouth daily.    [provider]  Cholecalciferol (VITAMIN D-3) 1000 UNITS CAPS Take 1 capsule by mouth daily.    [provider]  feeding supplement, ENSURE ENLIVE, (ENSURE ENLIVE) LIQD Take 237 mLs by mouth daily. 07/11/15   Marguarite Arbour, MD  furosemide (LASIX) 20 MG tablet Take 1 tablet (20 mg total) by mouth 2 (two) times daily. 02/14/18   Auburn Bilberry, MD  guaiFENesin (MUCINEX) 600 MG 12 hr tablet Take 1 tablet (600 mg total) by mouth 2 (two) times daily. 04/29/15   Marguarite Arbour, MD  ipratropium-albuterol (DUONEB) 0.5-2.5 (3) MG/3ML SOLN Take 3 mLs by nebulization 4 (four) times daily. 04/29/15   Marguarite Arbour, MD  losartan (COZAAR) 25 MG tablet Take 1 tablet (25 mg total) by mouth daily. Patient not taking: Reported on 02/11/2018 03/19/16   Katharina Caper, MD  mirtazapine (REMERON) 30 MG tablet Take 30 mg by mouth at bedtime.    [provider]  morphine 20 MG/5ML solution Take 0.6 mLs (2.4 mg total) by mouth every 2 (two) hours as needed for pain. 02/14/18   Auburn Bilberry, MD  Multiple Vitamin (MULTIVITAMIN WITH MINERALS) TABS tablet Take 1 tablet by mouth daily.    [provider]  pantoprazole (PROTONIX) 40 MG tablet Take 40 mg by mouth 2 (two) times daily.    [provider]  polyethylene glycol (MIRALAX / GLYCOLAX) packet Take 17 g by mouth daily as needed for mild constipation. Patient not taking: Reported on 07/10/2015 04/29/15   Marguarite Arbour, MD  senna-docusate (SENOKOT-S) 8.6-50 MG tablet Take 2 tablets by mouth daily.    [provider]  SPIRIVA HANDIHALER 18 MCG inhalation capsule Place 1 capsule into inhaler and inhale daily.    [provider]      PHYSICAL EXAMINATION:   VITAL SIGNS: Blood pressure (!) 160/72, pulse (!) 105, temperature 98.1 F (36.7 C), temperature source Oral, resp. rate  (!) 24, height 5\' 5"  (1.651 m), weight 40.8 kg (90 lb), SpO2 98 %.  GENERAL:  84 y.o.-year-old very thin patient lying in the bed with no acute distress.  EYES: Pupils equal, round, reactive to light and accommodation. No scleral icterus. Extraocular muscles intact.  HEENT: Head atraumatic, normocephalic. Oropharynx and nasopharynx clear.  NECK:  Supple, no jugular venous distention. No thyroid enlargement, no tenderness.  LUNGS: Normal breath sounds bilaterally, extensive wheezing, no crepitation. positive use of accessory muscles of respiration.  CARDIOVASCULAR: S1, S2 normal. systolic murmurs,no rubs, or gallops.  ABDOMEN: Soft, nontender, nondistended. Bowel sounds present. No organomegaly or mass.  EXTREMITIES: No pedal edema, cyanosis, or clubbing.  NEUROLOGIC: Cranial nerves II through XII are intact. Muscle strength 3-4/5 in all extremities. Sensation  intact. Gait not checked.  PSYCHIATRIC: The patient is alert and oriented x 1.  SKIN: No obvious rash, lesion, or ulcer.   LABORATORY PANEL:   CBC Recent Labs  Lab 01/24/2018 1846  WBC 7.2  HGB 13.3  HCT 38.4  PLT 137*  MCV 106.9*  MCH 37.1*  MCHC 34.7  RDW 13.0  LYMPHSABS 0.9*  MONOABS 0.3  EOSABS 0.6  BASOSABS 0.0   ------------------------------------------------------------------------------------------------------------------  Chemistries  Recent Labs  Lab 02/14/18 0451 02/15/2018 1846  NA 139 127*  K 3.8 4.0  CL 95* 88*  CO2 38* 29  GLUCOSE 116* 166*  BUN 23* 18  CREATININE 0.94 0.89  CALCIUM 9.2 9.0  AST  --  29  ALT  --  18  ALKPHOS  --  73  BILITOT  --  0.4   ------------------------------------------------------------------------------------------------------------------ estimated creatinine clearance is 28.7 mL/min (by C-G formula based on SCr of 0.89 mg/dL). ------------------------------------------------------------------------------------------------------------------ No results for input(s):  TSH, T4TOTAL, T3FREE, THYROIDAB in the last 72 hours.  Invalid input(s): FREET3   Coagulation profile No results for input(s): INR, PROTIME in the last 168 hours. ------------------------------------------------------------------------------------------------------------------- No results for input(s): DDIMER in the last 72 hours. -------------------------------------------------------------------------------------------------------------------  Cardiac Enzymes Recent Labs  Lab 02/15/2018 1846  TROPONINI 0.03*   ------------------------------------------------------------------------------------------------------------------ Invalid input(s): POCBNP  ---------------------------------------------------------------------------------------------------------------  Urinalysis    Component Value Date/Time   COLORURINE YELLOW (A) 03/12/2016 2135   APPEARANCEUR CLEAR (A) 03/12/2016 2135   APPEARANCEUR Clear 02/03/2015 1646   LABSPEC 1.010 03/12/2016 2135   LABSPEC 1.004 02/03/2015 1646   PHURINE 6.0 03/12/2016 2135   GLUCOSEU 50 (A) 03/12/2016 2135   GLUCOSEU Negative 02/03/2015 1646   HGBUR 2+ (A) 03/12/2016 2135   BILIRUBINUR NEGATIVE 03/12/2016 2135   BILIRUBINUR Negative 02/03/2015 1646   KETONESUR NEGATIVE 03/12/2016 2135   PROTEINUR NEGATIVE 03/12/2016 2135   NITRITE NEGATIVE 03/12/2016 2135   LEUKOCYTESUR NEGATIVE 03/12/2016 2135   LEUKOCYTESUR Negative 02/03/2015 1646     RADIOLOGY: Dg Chest 2 View  Result Date: 02/16/2018 CLINICAL DATA:  Acute shortness of breath EXAM: CHEST - 2 VIEW COMPARISON:  02/11/2018 FINDINGS: Increased interstitial markings. No frank interstitial edema. Trace left pleural effusion, improved. The heart is normal in size. Visualized osseous structures are within normal limits. IMPRESSION: Trace left pleural effusion, improved. No frank interstitial edema. Electronically Signed   By: Charline Bills M.D.   On: 02/10/2018 19:27    EKG: Orders  placed or performed during the hospital encounter of 02/11/2018  . ED EKG  . ED EKG    IMPRESSION AND PLAN:  * acute on chronic respiratory failure with hypoxia   Secondary to COPD exacerbation    IV and inhaled steroid, inhaled nebulizer therapy.   Continue supplemental oxygen try to taper up to baseline.  * chronic diastolic CHF   Currently no exacerbation symptoms.   We'll hold Lasix due to hyponatremia.  * hyponatremia   This may be secondary to Lasix use, Lasix and give gentle IV hydration.  * Gastroesophageal reflux disease   Continue Protonix.  * severe aortic stenosis  Patient's long-term prognosis is very poor and she is at very high risk of recurrent admissions like this. I have discussed her daughter again about clarifying the goals of care with hospice team and getting contact number of hospice nurse so in this type of situation you can contact hospice nurse directly and avoid a trip to emergency room.  All the records are reviewed and case discussed with ED provider. Management  plans discussed with the patient, family and they are in agreement.  CODE STATUS: DNR Code Status History    Date Active Date Inactive Code Status Order ID Comments User Context   02/11/2018 1819 02/14/2018 1722 DNR [id number]  Shaune Pollack, MD Inpatient   03/13/2016 1415 03/19/2016 1657 DNR [id number]  Merwyn Katos, MD Inpatient   03/12/2016 2159 03/13/2016 1415 Full Code [id number]  Lewie Loron, NP ED   07/10/2015 0543 07/11/2015 2135 Full Code [id number]  Crissie Figures, MD Inpatient   04/27/2015 2017 04/29/2015 1707 Full Code [id number]  Hower, Cletis Athens, MD ED    Questions for Most Recent Historical Code Status (Order [id number])    Question Answer Comment   In the event of cardiac or respiratory ARREST Do not call a "code blue"    In the event of cardiac or respiratory ARREST Do not perform Intubation, CPR, defibrillation or ACLS    In the event of cardiac or respiratory ARREST Use  medication by any route, position, wound care, and other measures to relive pain and suffering. May use oxygen, suction and manual treatment of airway obstruction as needed for comfort.        TOTAL TIME TAKING CARE OF THIS PATIENT: 50 minutes.  Discussed with her daughter and son-in-law in the room.  Altamese Dilling M.D on 01/30/2018   Between 7am to 6pm - Pager - [phone]  After 6pm go to [email or web address] - password [email or web address]  [facility]  Office  [phone]  CC: Primary care physician; Marguarite Arbour, MD   Note: This dictation was prepared with Dragon dictation along with smaller phrase technology. Any transcriptional errors that result from this process are unintentional.

## 2018-02-20 NOTE — ED Provider Notes (Signed)
[facility] Emergency Department Provider Note  Time seen: 7:49 PM  I have reviewed the triage vital signs and the nursing notes.   HISTORY  Chief Complaint Shortness of Breath    HPI [name] is a 84 y.o. female with a past medical history of anemia, anxiety, COPD, hypertension, CVA, presents to the emergency department for shortness of breath generalized fatigue and weakness.  According to the patient she was discharged from the hospital last Tuesday.  She has been home for approximately 6 days.  States she has been feeling increasingly weak over the past several days, having difficulty getting around her home, and has been feeling short of breath.  Went to her doctor today received breathing treatments, went back home and started wheezing with shortness of breath once again took additional breathing treatment still felt short of breath so her daughter called EMS the patient has brought to the emergency department.  Patient denies any chest pain, does state cough but denies any fever.  Largely negative review of systems otherwise.   Past Medical History:  Diagnosis Date  . Acute diastolic CHF (congestive heart failure) (HCC) 02/11/2018  . Anemia   . Anxiety   . Anxiety   . Aortic stenosis   . ASD (atrial septal defect)   . AVM (arteriovenous malformation)    pulmonary, s/p cautery  . Bronchitis   . Chronic obstruct airways disease (HCC)   . Depression   . Heart murmur   . HTN (hypertension)   . Hypothyroidism   . Osteoporosis, postmenopausal   . RA (rheumatoid arthritis) (HCC)   . Stroke [facility])     Patient Active Problem List   Diagnosis Date Noted  . Acute diastolic CHF (congestive heart failure) (HCC) 02/11/2018  . Acute bronchitis 03/19/2016  . Klebsiella pneumoniae infection 03/19/2016  . Proteus mirabilis infection 03/19/2016  . Acute renal failure (HCC) 03/19/2016  . Sinus tachycardia 03/19/2016  . Essential hypertension 03/19/2016   . Constipation 03/19/2016  . Severe aortic stenosis 03/19/2016  . LVH (left ventricular hypertrophy) 03/19/2016  . Moderate mitral regurgitation 03/19/2016  . Malnutrition of moderate degree 03/15/2016  . Acute respiratory failure (HCC) 03/12/2016  . COPD exacerbation (HCC) 07/10/2015  . Hemoptysis 07/10/2015  . Chronic anemia 07/10/2015  . Generalized weakness 07/10/2015  . HTN (hypertension) 07/10/2015  . Aortic stenosis 07/10/2015  . Depression 07/10/2015  . Hypothyroidism 07/10/2015  . Pressure ulcer stage III 07/10/2015  . Pressure ulcer 04/28/2015  . Acetabular fracture (HCC) 04/27/2015    Past Surgical History:  Procedure Laterality Date  . CHOLECYSTECTOMY    . FEMUR FRACTURE SURGERY    . FRACTURE SURGERY    . TOTAL ABDOMINAL HYSTERECTOMY      Prior to Admission medications   Medication Sig Start Date End Date Taking? Authorizing Provider  acetaminophen (TYLENOL) 325 MG tablet Take 2 tablets (650 mg total) by mouth every 6 (six) hours as needed for mild pain (or Fever >/= 101). Patient taking differently: Take 500 mg by mouth every 6 (six) hours as needed for mild pain (or Fever >/= 101).  04/29/15   [name], Duane Lope, MD  ALPRAZolam Prudy Feeler) 0.25 MG tablet Take 1 tablet (0.25 mg total) by mouth 2 (two) times daily as needed for anxiety. Patient taking differently: Take 0.25 mg by mouth 2 (two) times daily as needed for anxiety or sleep.  04/29/15   Marguarite Arbour, MD  antiseptic oral rinse (CPC / CETYLPYRIDINIUM CHLORIDE 0.05%) 0.05 % LIQD  solution 7 mLs by Mouth Rinse route 2 times daily at 12 noon and 4 pm. Patient not taking: Reported on 02/11/2018 03/19/16   Katharina Caper, MD  aspirin EC 81 MG EC tablet Take 1 tablet (81 mg total) by mouth daily. Patient taking differently: Take 81 mg by mouth at bedtime.  04/29/15   Marguarite Arbour, MD  budesonide (PULMICORT) 0.25 MG/2ML nebulizer solution Take 2 mLs (0.25 mg total) by nebulization 2 (two) times daily. Patient not  taking: Reported on 02/11/2018 03/19/16   Katharina Caper, MD  Calcium Carbonate-Vitamin D (CALCIUM-VITAMIN D) 500-200 MG-UNIT per tablet Take 1 tablet by mouth daily.    [provider]  Cholecalciferol (VITAMIN D-3) 1000 UNITS CAPS Take 1 capsule by mouth daily.    [provider]  feeding supplement, ENSURE ENLIVE, (ENSURE ENLIVE) LIQD Take 237 mLs by mouth daily. 07/11/15   Marguarite Arbour, MD  furosemide (LASIX) 20 MG tablet Take 1 tablet (20 mg total) by mouth 2 (two) times daily. 02/14/18   Auburn Bilberry, MD  guaiFENesin (MUCINEX) 600 MG 12 hr tablet Take 1 tablet (600 mg total) by mouth 2 (two) times daily. 04/29/15   Marguarite Arbour, MD  ipratropium-albuterol (DUONEB) 0.5-2.5 (3) MG/3ML SOLN Take 3 mLs by nebulization 4 (four) times daily. 04/29/15   Marguarite Arbour, MD  losartan (COZAAR) 25 MG tablet Take 1 tablet (25 mg total) by mouth daily. Patient not taking: Reported on 02/11/2018 03/19/16   Katharina Caper, MD  mirtazapine (REMERON) 30 MG tablet Take 30 mg by mouth at bedtime.    [provider]  morphine 20 MG/5ML solution Take 0.6 mLs (2.4 mg total) by mouth every 2 (two) hours as needed for pain. 02/14/18   Auburn Bilberry, MD  Multiple Vitamin (MULTIVITAMIN WITH MINERALS) TABS tablet Take 1 tablet by mouth daily.    [provider]  pantoprazole (PROTONIX) 40 MG tablet Take 40 mg by mouth 2 (two) times daily.    [provider]  polyethylene glycol (MIRALAX / GLYCOLAX) packet Take 17 g by mouth daily as needed for mild constipation. Patient not taking: Reported on 07/10/2015 04/29/15   Marguarite Arbour, MD  senna-docusate (SENOKOT-S) 8.6-50 MG tablet Take 2 tablets by mouth daily.    [provider]  SPIRIVA HANDIHALER 18 MCG inhalation capsule Place 1 capsule into inhaler and inhale daily.    [provider]    No Known Allergies  Family History  Problem Relation Age of Onset  . CAD Mother   . Stroke Mother   . Lung  cancer Father   . Rheum arthritis Other     Social History Social History   Tobacco Use  . Smoking status: Never Smoker  . Smokeless tobacco: Never Used  Substance Use Topics  . Alcohol use: No    Alcohol/week: 0.0 oz  . Drug use: No    Review of Systems Constitutional: Positive for generalized fatigue/weakness ENT: Negative for recent illness/congestion Cardiovascular: Negative for chest pain. Respiratory: Positive for shortness of breath and cough.  Positive for wheeze. Gastrointestinal: Negative for abdominal pain, vomiting positive for nausea. Genitourinary: Negative for urinary compaints Musculoskeletal: Negative for leg pain or swelling. Skin: Negative for skin complaints  Neurological: Negative for headache All other ROS negative  ____________________________________________   PHYSICAL EXAM:  VITAL SIGNS: ED Triage Vitals  Enc Vitals Group     BP 03/02/2018 1843 (!) 160/72     Pulse Rate 03-02-2018 1843 (!) 105  Resp 02/16/2018 1843 (!) 24     Temp 02/14/2018 1843 98.1 F (36.7 C)     Temp Source 01/23/2018 1843 Oral     SpO2 01/23/2018 1841 98 %     Weight 02/07/2018 1844 90 lb (40.8 kg)     Height 02/12/2018 1844 5\' 5"  (1.651 m)     Head Circumference --      Peak Flow --      Pain Score 02/11/2018 1844 0     Pain Loc --      Pain Edu? --      Excl. in GC? --    Constitutional: Alert and oriented. Well appearing and in no distress. Eyes: Normal exam ENT   Head: Normocephalic and atraumatic   Mouth/Throat: Mucous membranes are moist. Cardiovascular: Normal rate, regular rhythm.  3/6 murmur. Respiratory: Normal respiratory effort without tachypnea nor retractions.  Mild expiratory wheeze bilaterally. Gastrointestinal: Soft and nontender. No distention.   Musculoskeletal: Nontender with normal range of motion in all extremities. No lower extremity tenderness or edema. Neurologic:  Normal speech and language. No gross focal neurologic deficits  Skin:  Skin is  warm, dry and intact.  Psychiatric: Mood and affect are normal.  ____________________________________________    EKG  EKG reviewed and interpreted by myself shows sinus tachycardia 104 bpm with a widened QRS, normal axis, largely normal intervals with nonspecific ST changes.  ____________________________________________    RADIOLOGY  Chest x-ray shows trace pleural effusion.  No interstitial edema.  ____________________________________________   INITIAL IMPRESSION / ASSESSMENT AND PLAN / ED COURSE  Pertinent labs & imaging results that were available during my care of the patient were reviewed by me and considered in my medical decision making (see chart for details).  She presents to the emergency department for increased generalized fatigue/weakness, cough or shortness of breath.  On examination patient has diffuse wheeze most consistent with COPD exacerbation.  We will dose DuoNeb as well as Solu-Medrol.  Lab work shows a troponin of 0.03 and a sodium of 127.  In reviewing the patient's records she appears to normally have a normal sodium.  Patient was just admitted to the hospital for diuresis and CHF.  Given the patient's generalized fatigue and weakness we will start the patient on IV fluids for her hyponatremia.  We will dose Solu-Medrol and DuoNeb for COPD and admit to the hospitalist service.  Patient agreeable to this plan of care.  ____________________________________________   FINAL CLINICAL IMPRESSION(S) / ED DIAGNOSES  COPD exacerbation Hyponatremia Weakness    02/27/2018, MD 01/28/2018 [phone]

## 2018-02-20 NOTE — ED Triage Notes (Signed)
Pt arrived via ems for c/o shortness of breath - pt was just discharged from hospital Tuesday for COPD/CHF - pt reports being short of breath this am and relieved by home neb - pt went to PCP at 230pm and was given 2 nebs at the office - pt went home per daughter and became anxious which increased shortness of breath - pt took 2 nebs at home and then called ems

## 2018-02-20 NOTE — ED Notes (Signed)
Pt assisted to use bedpan to void

## 2018-02-20 NOTE — Progress Notes (Signed)
Family Meeting Note  Advance Directive:yes  Today a meeting took place with the daughter.  Patient is unable to participate due HK:GOV[facility] capacity dementia and respi distress   The following clinical team members were present during this meeting:MD  The following were discussed:Patient's diagnosis: COPD, CHF, severe aortic stenosis, chronic respiratory failure, Patient's progosis: < 12 months and Goals for treatment: DNR  Patient's long-term prognosis is very poor and she is at very high risk of recurrent admissions like this. I have discussed her daughter again about clarifying the goals of care with hospice team and getting contact number of hospice nurse so in this type of situation you can contact hospice nurse directly and avoid a trip to emergency room. They understand and agree to meet hospice and palliative care team tomorrow.  Additional follow-up to be provided: palliative care  Time spent during discussion:20 minutes  Altamese Dilling, MD

## 2018-02-20 NOTE — ED Notes (Signed)
Dr Lenard Lance notified of elevated troponin of 0.03 - no new orders at this time

## 2018-02-21 ENCOUNTER — Other Ambulatory Visit: Payer: Self-pay

## 2018-02-21 DIAGNOSIS — I350 Nonrheumatic aortic (valve) stenosis: Secondary | ICD-10-CM

## 2018-02-21 DIAGNOSIS — J9621 Acute and chronic respiratory failure with hypoxia: Secondary | ICD-10-CM

## 2018-02-21 DIAGNOSIS — Z7189 Other specified counseling: Secondary | ICD-10-CM

## 2018-02-21 DIAGNOSIS — Z515 Encounter for palliative care: Secondary | ICD-10-CM

## 2018-02-21 LAB — BASIC METABOLIC PANEL
ANION GAP: 6 (ref 5–15)
BUN: 17 mg/dL (ref 6–20)
CO2: 29 mmol/L (ref 22–32)
Calcium: 8.1 mg/dL — ABNORMAL LOW (ref 8.9–10.3)
Chloride: 95 mmol/L — ABNORMAL LOW (ref 101–111)
Creatinine, Ser: 0.97 mg/dL (ref 0.44–1.00)
GFR calc Af Amer: 59 mL/min — ABNORMAL LOW (ref >60)
GFR, EST NON AFRICAN AMERICAN: 51 mL/min — AB (ref >60)
Glucose, Bld: 211 mg/dL — ABNORMAL HIGH (ref 65–99)
POTASSIUM: 5 mmol/L (ref 3.5–5.1)
SODIUM: 130 mmol/L — AB (ref 135–145)

## 2018-02-21 LAB — CBC
HEMATOCRIT: 38 % (ref 35.0–47.0)
HEMOGLOBIN: 13.1 g/dL (ref 12.0–16.0)
MCH: 37.3 pg — ABNORMAL HIGH (ref 26.0–34.0)
MCHC: 34.5 g/dL (ref 32.0–36.0)
MCV: 108.2 fL — ABNORMAL HIGH (ref 80.0–100.0)
Platelets: 127 10*3/uL — ABNORMAL LOW (ref 150–440)
RBC: 3.51 MIL/uL — ABNORMAL LOW (ref 3.80–5.20)
RDW: 12.9 % (ref 11.5–14.5)
WBC: 4.8 10*3/uL (ref 3.6–11.0)

## 2018-02-21 MED ORDER — MORPHINE SULFATE (CONCENTRATE) 10 MG/0.5ML PO SOLN: 2.5000 mg | ORAL | Status: DC | PRN

## 2018-02-21 MED ORDER — IPRATROPIUM-ALBUTEROL 0.5-2.5 (3) MG/3ML IN SOLN
3.0000 mL | RESPIRATORY_TRACT | Status: DC | PRN
  Administered 2018-02-21 (×3): 3 mL via RESPIRATORY_TRACT
  Filled 2018-02-21 (×3): qty 3

## 2018-02-21 MED ORDER — MORPHINE SULFATE (CONCENTRATE) 10 MG/0.5ML PO SOLN
2.5000 mg | ORAL | Status: DC | PRN
  Administered 2018-02-21: 3 mg via SUBLINGUAL
  Filled 2018-02-21: qty 1

## 2018-02-21 MED ORDER — SENNA 8.6 MG PO TABS
2.0000 | ORAL_TABLET | Freq: Every day | ORAL | Status: DC
  Administered 2018-02-21: 17.2 mg via ORAL
  Filled 2018-02-21: qty 2

## 2018-02-21 MED ORDER — IPRATROPIUM-ALBUTEROL 0.5-2.5 (3) MG/3ML IN SOLN
3.0000 mL | RESPIRATORY_TRACT | Status: DC | PRN
  Administered 2018-02-21 – 2018-02-22 (×4): 3 mL via RESPIRATORY_TRACT
  Filled 2018-02-21 (×3): qty 3

## 2018-02-21 MED ORDER — MORPHINE SULFATE (CONCENTRATE) 10 MG/0.5ML PO SOLN
5.0000 mg | ORAL | Status: DC | PRN
  Administered 2018-02-21 – 2018-02-22 (×6): 5 mg via SUBLINGUAL
  Filled 2018-02-21 (×6): qty 1

## 2018-02-21 NOTE — Progress Notes (Signed)
New referral for [facility] services at home received from [place] following a Palliative Medicine consult. Patient is an 84 year old woman with a known history of aortic stenosis, diastolic heart failure, atrial septal defect, dementia and stroke admitted from home on 4/29 with respiratory distress related to exertion.  CXR in the ED revealed trace left pleural effusion, labs with hyponatremia. Family met with Palliative Medicine NP Florentina Jenny today and have chosen to focus on comfort/symptom management with the support of hospice services at home. Liquid morphine has been ordered for management of  dyspnea.  Writer met in the room with patient's daughter [name], son in law Lonell Face, son Rush Landmark and granddaughter April to discuss hospice services. Writer met family last week and they are familiar with services. No DME needs at this time. Patient appeared to have increased work of breathing, much education and discussion with family regarding effectiveness of liquid morphine for symptom management, family in agreement for dose of 5 mg. Dr. Manuella Ghazi and staff RN Ander Purpura updated. No DME needs for discharge. Plan is for discharge home tomorrow, patient will require EMS transport and signed DNR. CMRN Isaias Cowman updated. Patient information faxed to referral. Will continue to follow through discharge. Flo Shanks RN, BSN, [facility]  [facility] [place], hospital liaison [phone]

## 2018-02-21 NOTE — Progress Notes (Signed)
Chaplain responded to a referral from [place] and visited with the patient and family.  During the visit the family processed the patient's declining health and the decision to request Hospice Care.  The family is supportive of the patient's desire to return home and have rallied around the patient. Eight family members were present during the visit. Chaplain offered active listening, shared stories and prayer for the family and patient.

## 2018-02-21 NOTE — Progress Notes (Signed)
[facility] [place] at [facility]   PATIENT NAME: [name]    MR#:  [id number]  DATE OF BIRTH:  1927-11-23  SUBJECTIVE:  CHIEF COMPLAINT:   Chief Complaint  Patient presents with  . Shortness of Breath  Dyspneic at rest REVIEW OF SYSTEMS:  Review of Systems  Constitutional: Negative for chills, fever and weight loss.  HENT: Negative for nosebleeds and sore throat.   Eyes: Negative for blurred vision.  Respiratory: Positive for shortness of breath. Negative for cough and wheezing.   Cardiovascular: Negative for chest pain, orthopnea, leg swelling and PND.  Gastrointestinal: Negative for abdominal pain, constipation, diarrhea, heartburn, nausea and vomiting.  Genitourinary: Negative for dysuria and urgency.  Musculoskeletal: Negative for back pain.  Skin: Negative for rash.  Neurological: Negative for dizziness, speech change, focal weakness and headaches.  Endo/Heme/Allergies: Does not bruise/bleed easily.  Psychiatric/Behavioral: Negative for depression.    DRUG ALLERGIES:  No Known Allergies VITALS:  Blood pressure 131/64, pulse 84, temperature 98.3 F (36.8 C), temperature source Oral, resp. rate 19, height 5\' 5"  (1.651 m), weight 40.8 kg (90 lb), SpO2 97 %. PHYSICAL EXAMINATION:  Physical Exam  Constitutional: She is oriented to person, place, and time.  HENT:  Head: Normocephalic and atraumatic.  Eyes: Pupils are equal, round, and reactive to light. Conjunctivae and EOM are normal.  Neck: Normal range of motion. Neck supple. No tracheal deviation present. No thyromegaly present.  Cardiovascular: Normal rate, regular rhythm and normal heart sounds.  Pulmonary/Chest: Accessory muscle usage present. Tachypnea noted. No respiratory distress. She has decreased breath sounds. She has no wheezes. She exhibits no tenderness.  Abdominal: Soft. Bowel sounds are normal. She exhibits no distension. There is no tenderness.  Musculoskeletal: Normal range  of motion.  Neurological: She is alert and oriented to person, place, and time. No cranial nerve deficit.  Skin: Skin is warm and dry. No rash noted.   LABORATORY PANEL:  Female CBC Recent Labs  Lab 02/21/18 0634  WBC 4.8  HGB 13.1  HCT 38.0  PLT 127*   ------------------------------------------------------------------------------------------------------------------ Chemistries  Recent Labs  Lab 03-03-18 1846 02/21/18 0634  NA 127* 130*  K 4.0 5.0  CL 88* 95*  CO2 29 29  GLUCOSE 166* 211*  BUN 18 17  CREATININE 0.89 0.97  CALCIUM 9.0 8.1*  AST 29  --   ALT 18  --   ALKPHOS 73  --   BILITOT 0.4  --    RADIOLOGY:  Dg Chest 2 View  Result Date: 03-03-2018 CLINICAL DATA:  Acute shortness of breath EXAM: CHEST - 2 VIEW COMPARISON:  02/11/2018 FINDINGS: Increased interstitial markings. No frank interstitial edema. Trace left pleural effusion, improved. The heart is normal in size. Visualized osseous structures are within normal limits. IMPRESSION: Trace left pleural effusion, improved. No frank interstitial edema. Electronically Signed   By: 02/13/2018 M.D.   On: 03-03-2018 19:27   ASSESSMENT AND PLAN:   * acute on chronic respiratory failure with hypoxia   Secondary to COPD exacerbation - IV and inhaled steroid, inhaled nebulizer therapy.   Continue supplemental oxygen try to taper up to baseline.  * chronic diastolic CHF   Currently no exacerbation symptoms. - hold Lasix due to hyponatremia.  * hyponatremia   This may be secondary to Lasix use, hold Lasix and give gentle IV hydration.  * Gastroesophageal reflux disease   Continue Protonix.  * severe aortic stenosis  Patient's long-term prognosis remains very poor and she  is at very high risk of recurrent admissions like this.  Appreciate Palliative care input. Patient seems very appropriate for Hospice services. D/w [facility] who has escalted the case to their medical director.       All the  records are reviewed and case discussed with Care Management/Social Worker. Management plans discussed with the patient, family ( at bedside)and they are in agreement.  CODE STATUS: DNR  TOTAL TIME TAKING CARE OF THIS PATIENT: 35 minutes.   More than 50% of the time was spent in counseling/coordination of care: YES  POSSIBLE D/C IN 1 DAYS, DEPENDING ON CLINICAL CONDITION. And Hospice Acceptance   Delfino Lovett M.D on 02/21/2018 at 3:30 PM  Between 7am to 6pm - Pager - [phone]  After 6pm go to [email or web address] - Social research officer, government  [facility] [place] [facility]  Office  [phone]  CC: Primary care physician; Marguarite Arbour, MD  Note: This dictation was prepared with Dragon dictation along with smaller phrase technology. Any transcriptional errors that result from this process are unintentional.

## 2018-02-21 NOTE — Consult Note (Signed)
Consultation Note Date: 02/21/2018   Patient Name: [name]  DOB: 05-Aug-1928  MRN: [id number]  Age / Sex: 84 y.o., female  PCP: Idelle Crouch, MD Referring Physician: Max Sane, MD  Reason for Consultation: Establishing goals of care and Hospice Evaluation  HPI/Patient Profile: 84 y.o. female  with past medical history of critical aortic stenosis, diastolic heart failure, atrial septal defect, dementia and stroke who was admitted on 01/30/2018 with respiratory distress secondary to exertion.    Clinical Assessment and Goals of Care:  I have reviewed medical records including EPIC notes, labs and imaging, received report from the care team, assessed the patient and then met at the bedside along with her daughter, son and family to discuss diagnosis prognosis, GOC, EOL wishes, disposition and options.  The patient lives at home with her daughter, [name], and son in law.  She was discharged from the Hospital last Tuesday after being treated for acute congestive heart failure.  Per her daughter she was having a good day when she was evaluated by Hospice on Wednesday at home.  Hospice felt she did not qualify for their services.  The patient was able to get up and walk.  Her daughter Thayer Headings feels the exertion started her current episode of difficulty breathing.  We discussed the patient's critical aortic stenosis and the fact that exertion is very likely to cause difficulty breathing.    The family would like to try taking the patient home with Hospice once again.  They would strongly prefer not to return to the hospital.   .  Primary Decision Maker:  Daughter [name].    SUMMARY OF RECOMMENDATIONS    Please ask Hospice to evaluate her here prior to d/c to be certain she qualifies for their services.  The patient is dyspneic at rest.  She physiologically can not exert herself to get up and walk due to  critical aortic stenosis.  Her family understands that she is bedbound.  Will change orders to reflect more comfort care rather than aggressive treatment.  Code Status/Advance Care Planning:  DNR/DNI   Symptom Management:   SL morphine PRN dyspnea  Additional Recommendations (Limitations, Scope, Preferences):  Avoid Hospitalization, Full Comfort Care and Minimize Medications  Palliative Prophylaxis:   Frequent Pain Assessment  Psycho-social/Spiritual:   Desire for further Chaplaincy support: yes  Prognosis:  Less than 3-6 months secondary to critical aortic stenosis, along with end stage COPD.  She is dyspneic at rest even on supplemental oxygen, primarily bedbound.  Family's goals are for comfort without return to the Hospital.    Discharge Planning: Home with Hospice      Primary Diagnoses: Present on Admission: . COPD exacerbation ([place]) . Acute on chronic respiratory failure ([place])   I have reviewed the medical record, interviewed the patient and family, and examined the patient. The following aspects are pertinent.  Past Medical History:  Diagnosis Date  . Acute diastolic CHF (congestive heart failure) ([place]) 02/11/2018  . Anemia   . Anxiety   . Anxiety   .  Aortic stenosis   . ASD (atrial septal defect)   . AVM (arteriovenous malformation)    pulmonary, s/p cautery  . Bronchitis   . Chronic obstruct airways disease ([place])   . Depression   . Heart murmur   . HTN (hypertension)   . Hypothyroidism   . Osteoporosis, postmenopausal   . RA (rheumatoid arthritis) ([place])   . Stroke [facility])    Social History   Socioeconomic History  . Marital status: Widowed    Spouse name: Not on file  . Number of children: Not on file  . Years of education: Not on file  . Highest education level: Not on file  Occupational History  . Not on file  Social Needs  . Financial resource strain: Not on file  . Food insecurity:    Worry: Not on file    Inability: Not on file    . Transportation needs:    Medical: Not on file    Non-medical: Not on file  Tobacco Use  . Smoking status: Never Smoker  . Smokeless tobacco: Never Used  Substance and Sexual Activity  . Alcohol use: No    Alcohol/week: 0.0 oz  . Drug use: No  . Sexual activity: Not on file  Lifestyle  . Physical activity:    Days per week: Not on file    Minutes per session: Not on file  . Stress: Not on file  Relationships  . Social connections:    Talks on phone: Not on file    Gets together: Not on file    Attends religious service: Not on file    Active member of club or organization: Not on file    Attends meetings of clubs or organizations: Not on file    Relationship status: Not on file  Other Topics Concern  . Not on file  Social History Narrative  . Not on file   Family History  Problem Relation Age of Onset  . CAD Mother   . Stroke Mother   . Lung cancer Father   . Rheum arthritis Other    Scheduled Meds: . aspirin EC  81 mg Oral Daily  . budesonide  0.25 mg Nebulization BID  . calcium-vitamin D  1 tablet Oral Daily  . cholecalciferol  1,000 Units Oral Daily  . feeding supplement (ENSURE ENLIVE)  237 mL Oral Q24H  . guaiFENesin  600 mg Oral BID  . heparin  5,000 Units Subcutaneous Q8H  . ipratropium-albuterol  3 mL Nebulization QID  . methylPREDNISolone (SOLU-MEDROL) injection  60 mg Intravenous Q6H  . mirtazapine  30 mg Oral QHS  . multivitamin with minerals  1 tablet Oral Daily  . pantoprazole  40 mg Oral BID  . senna-docusate  2 tablet Oral Daily  . tiotropium  1 capsule Inhalation Daily   Continuous Infusions: . sodium chloride 50 mL/hr at 02/21/18 0649   PRN Meds:.ALPRAZolam, docusate sodium, ipratropium-albuterol, morphine CONCENTRATE No Known Allergies Review of Systems complains of constipation.  Physical Exam  Frail, elderly, pleasantly demented female, multiple family members at bedside. irreg irreg resp - mild resp distress. With increased WOB  noted in abdomen. Abdomen soft, nt  Vital Signs: BP 127/67 (BP Location: Left Arm)   Pulse 93   Temp 98.1 F (36.7 C) (Oral)   Resp (!) 22   Ht _0  (1.651 m)   Wt 40.8 kg (90 lb)   SpO2 96%   BMI 14.98 kg/m  Pain Scale: 0-10   Pain Score: 0-No  pain   SpO2: SpO2: 96 % O2 Device:SpO2: 96 % O2 Flow Rate: .O2 Flow Rate (L/min): 4 L/min  IO: Intake/output summary:   Intake/Output Summary (Last 24 hours) at 02/21/2018 1025 Last data filed at 02/21/2018 0900 Gross per 24 hour  Intake 1789 ml  Output 550 ml  Net 1239 ml    LBM: Last BM Date: 02/09/2018 Baseline Weight: Weight: 40.8 kg (90 lb) Most recent weight: Weight: 40.8 kg (90 lb)     Palliative Assessment/Data:     Time In: 10:00 Time Out: 10:49 Time Total: 49 min. Greater than 50%  of this time was spent counseling and coordinating care related to the above assessment and plan.  Signed by: Florentina Jenny, PA-C Palliative Medicine Pager: [phone]  Please contact Palliative Medicine Team phone at [phone] for questions and concerns.  For individual provider: See Shea Evans

## 2018-02-22 DIAGNOSIS — Z515 Encounter for palliative care: Secondary | ICD-10-CM

## 2018-02-22 MED ORDER — SODIUM CHLORIDE 0.9 % IV SOLN
INTRAVENOUS | Status: DC
  Administered 2018-02-22: 09:00:00 via INTRAVENOUS

## 2018-02-22 MED ORDER — POLYVINYL ALCOHOL 1.4 % OP SOLN
1.0000 [drp] | Freq: Four times a day (QID) | OPHTHALMIC | Status: DC | PRN
  Filled 2018-02-22 (×2): qty 15

## 2018-02-22 MED ORDER — MORPHINE SULFATE (PF) 2 MG/ML IV SOLN: 2.0000 mg | INTRAVENOUS | Status: DC | PRN

## 2018-02-22 MED ORDER — GLYCOPYRROLATE 0.2 MG/ML IJ SOLN
0.6000 mg | Freq: Three times a day (TID) | INTRAMUSCULAR | Status: DC
  Filled 2018-02-22: qty 3

## 2018-02-22 MED ORDER — LORAZEPAM 1 MG PO TABS: 1.0000 mg | ORAL_TABLET | ORAL | Status: DC | PRN

## 2018-02-22 MED ORDER — FUROSEMIDE 10 MG/ML IJ SOLN
40.0000 mg | Freq: Once | INTRAMUSCULAR | Status: AC
  Administered 2018-02-22: 40 mg via INTRAVENOUS
  Filled 2018-02-22: qty 4

## 2018-02-22 MED ORDER — GLYCOPYRROLATE 1 MG PO TABS
1.0000 mg | ORAL_TABLET | ORAL | Status: DC | PRN
  Filled 2018-02-22: qty 1

## 2018-02-22 MED ORDER — GLYCOPYRROLATE 0.2 MG/ML IJ SOLN
0.2000 mg | INTRAMUSCULAR | Status: DC | PRN
  Filled 2018-02-22: qty 1

## 2018-02-22 MED ORDER — HALOPERIDOL LACTATE 5 MG/ML IJ SOLN
0.5000 mg | INTRAMUSCULAR | Status: DC | PRN
  Filled 2018-02-22: qty 0.1

## 2018-02-22 MED ORDER — HALOPERIDOL LACTATE 2 MG/ML PO CONC
0.5000 mg | ORAL | Status: DC | PRN
  Filled 2018-02-22: qty 0.3

## 2018-02-22 MED ORDER — MORPHINE 100MG IN NS 100ML (1MG/ML) PREMIX INFUSION
2.0000 mg/h | INTRAVENOUS | Status: DC
  Administered 2018-02-22: 2 mg/h via INTRAVENOUS
  Filled 2018-02-22: qty 100

## 2018-02-22 MED ORDER — LORAZEPAM 2 MG/ML PO CONC
1.0000 mg | ORAL | Status: DC | PRN
  Filled 2018-02-22: qty 0.5

## 2018-02-22 MED ORDER — MORPHINE BOLUS VIA INFUSION
2.0000 mg | INTRAVENOUS | Status: DC | PRN
  Administered 2018-02-22: 2 mg via INTRAVENOUS
  Filled 2018-02-22: qty 4

## 2018-02-22 MED ORDER — LORAZEPAM 2 MG/ML IJ SOLN: 0.5000 mg | INTRAMUSCULAR | Status: DC | PRN

## 2018-02-22 MED ORDER — BIOTENE DRY MOUTH MT LIQD
15.0000 mL | OROMUCOSAL | Status: DC | PRN
Start: 2018-02-22 — End: 2018-02-22

## 2018-02-22 MED ORDER — LORAZEPAM 2 MG/ML IJ SOLN: 1.0000 mg | INTRAMUSCULAR | Status: DC | PRN

## 2018-02-22 MED ORDER — HALOPERIDOL 0.5 MG PO TABS
0.5000 mg | ORAL_TABLET | ORAL | Status: DC | PRN
  Filled 2018-02-22: qty 1

## 2018-02-22 DEATH — deceased

## 2018-02-24 ENCOUNTER — Ambulatory Visit: Payer: Medicare Other | Admitting: Family

## 2018-03-25 NOTE — Progress Notes (Signed)
   03/22/18 0900  Clinical Encounter Type  Visited With Family  Visit Type Follow-up;Patient actively dying  Referral From Nurse  Spiritual Encounters  Spiritual Needs Emotional  EOL - Met with family briefly at bedside offering support to them as they require throughout the day

## 2018-03-25 NOTE — Progress Notes (Signed)
Funeral home arrived to room to receive body.

## 2018-03-25 NOTE — Clinical Social Work Note (Signed)
Please refer to RN CM documentation regarding imminent hospital death. York Spaniel MSW,LCSW [phone]

## 2018-03-25 NOTE — Care Management (Signed)
Patient status has changed overnight.  Per palliative patient is actively dying.  Clydie Braun with [facility] notified. RNCM following should need arise.

## 2018-03-25 NOTE — Progress Notes (Signed)
   02/23/2018 0900  Clinical Encounter Type  Visited With Family  Visit Type Follow-up;Patient actively dying  Referral From Nurse  Spiritual Encounters  Spiritual Needs Emotional   Patient death - [facility] paged to offer support; grief and emotional support provided along with prayer. CH available for additional support as needed,

## 2018-03-25 NOTE — Plan of Care (Signed)
Patient placed on non-rebreather due to oxygen level decreasing to 88.  Doctor ordered one time dose of iv lasix due to fluid overload.  [name]

## 2018-03-25 NOTE — Progress Notes (Addendum)
Notified Dr. Sherryll Burger that patient sounds wet with secretions. Okay per MD to stop fluids. MD to come and speak with family. Per MD continue morphine and change over to nasal canula at 4-6 liters, sats may not maintain but okay per MD.

## 2018-03-25 NOTE — Death Summary Note (Signed)
  DEATH SUMMARY   Patient Details  Name: [name] MRN: [id number] DOB: 08-28-1928  Admission/Discharge Information   Admit Date:  2018-03-14  Date of Death: Date of Death: 2018/03/16  Time of Death: Time of Death: 0944  Length of Stay: 2  Referring Physician: Marguarite Arbour, MD   Reason(s) for Hospitalization  Shortness of Breath  Diagnoses  Preliminary cause of death: Severe Aortic Stenosis Secondary Diagnoses (including complications and co-morbidities):  Principal Problem:   Acute on chronic respiratory failure with hypoxia (HCC) Active Problems:   COPD exacerbation (HCC)   Acute on chronic respiratory failure [facility])   Palliative care encounter   Encounter for hospice care discussion   Terminal care  Brief Hospital Course (including significant findings, care, treatment, and services provided and events leading to death)  [name] is a 84 y.o. year old female with K/H/O critical aortic stenosis, diastolic heart failure, atrial septal defect, dementia and strokewho was admitted onMay 21, 2019with respiratory distress secondary to worsening of SOB. Seem to have clinically worsen while here and actively dying.  Patient's family is interested and agreeable to keep her comfortable. Comfort care orders were in place and she passed peacefully. Pertinent Labs and Studies  Significant Diagnostic Studies Dg Chest 2 View  Result Date: 03-14-18 CLINICAL DATA:  Acute shortness of breath EXAM: CHEST - 2 VIEW COMPARISON:  02/11/2018 FINDINGS: Increased interstitial markings. No frank interstitial edema. Trace left pleural effusion, improved. The heart is normal in size. Visualized osseous structures are within normal limits. IMPRESSION: Trace left pleural effusion, improved. No frank interstitial edema. Electronically Signed   By: Charline Bills M.D.   On: Mar 14, 2018 19:27   Dg Chest 2 View  Result Date: 02/11/2018 CLINICAL DATA:  COPD.  Wheezing today. EXAM: CHEST -  2 VIEW COMPARISON:  Single-view of the chest 03/14/2016 and 0 5 04/28/2015. CT chest 04/29/2015. FINDINGS: There are small bilateral pleural effusions. Cardiomegaly and interstitial edema are seen. Mild basilar atelectasis is noted. Aortic atherosclerosis is identified. IMPRESSION: Interstitial pulmonary edema with associated small bilateral pleural effusions. Cardiomegaly. Atherosclerosis. Electronically Signed   By: Drusilla Kanner M.D.   On: 02/11/2018 13:55    Microbiology No results found for this or any previous visit (from the past 240 hour(s)).  Lab Basic Metabolic Panel: Recent Labs  Lab 03/14/2018 1846 02/21/18 0634  NA 127* 130*  K 4.0 5.0  CL 88* 95*  CO2 29 29  GLUCOSE 166* 211*  BUN 18 17  CREATININE 0.89 0.97  CALCIUM 9.0 8.1*   Liver Function Tests: Recent Labs  Lab 2018/03/14 1846  AST 29  ALT 18  ALKPHOS 73  BILITOT 0.4  PROT 7.3  ALBUMIN 4.3   No results for input(s): LIPASE, AMYLASE in the last 168 hours. No results for input(s): AMMONIA in the last 168 hours. CBC: Recent Labs  Lab 2018-03-14 1846 02/21/18 0634  WBC 7.2 4.8  NEUTROABS 5.3  --   HGB 13.3 13.1  HCT 38.4 38.0  MCV 106.9* 108.2*  PLT 137* 127*   Cardiac Enzymes: Recent Labs  Lab 14-Mar-2018 1846  TROPONINI 0.03*   Sepsis Labs: Recent Labs  Lab Mar 14, 2018 1846 02/21/18 0634  WBC 7.2 4.8    Procedures/Operations  none   Timofey Carandang 02/27/2018, 2:41 PM

## 2018-03-25 NOTE — Progress Notes (Signed)
Daily Progress Note   Patient Name: [name]       Date: 02/23/2018 DOB: 21-Jun-1928  Age: 84 y.o. MRN#: [id number] Attending Physician: Delfino Lovett, MD Primary Care Physician: Marguarite Arbour, MD Admit Date: 02-Mar-2018  Reason for Consultation/Follow-up: Establishing goals of care, Psychosocial/spiritual support and Terminal Care  Subjective: Patient appears to have changed significantly overnight.  She is filling up with fluid.  She was given IV lasix but did not urinate per bedside RN.  Spoke with daughter and son at bedside.  Explained to her that I believe the patient is actively dying.  They agreed.  I offered to take measures to make her more comfortable and they were appreciative.   Assessment: Patient volume overloaded and actively dying.     Patient Profile/HPI:  84 y.o. female  with past medical history of critical aortic stenosis, diastolic heart failure, atrial septal defect, dementia and stroke who was admitted on 03/02/2018 with respiratory distress secondary to exertion.     Length of Stay: 2  Current Medications: Scheduled Meds:  . glycopyrrolate  0.6 mg Intravenous TID    Continuous Infusions: . sodium chloride    . morphine      PRN Meds: antiseptic oral rinse, glycopyrrolate **OR** glycopyrrolate **OR** glycopyrrolate, haloperidol **OR** haloperidol **OR** haloperidol lactate, ipratropium-albuterol, LORazepam **OR** LORazepam **OR** LORazepam, LORazepam, morphine, morphine CONCENTRATE, polyvinyl alcohol  Physical Exam        Elderly frail, awake, appears frightened CV irreg resp sound very wet.  Patient gurgling Abdomen firm.  Vital Signs: BP 116/61 (BP Location: Left Arm)   Pulse 98   Temp 98.9 F (37.2 C) (Oral)   Resp (!) 24   Ht 5\' 5"   (1.651 m)   Wt 40.8 kg (90 lb)   SpO2 100%   BMI 14.98 kg/m  SpO2: SpO2: 100 % O2 Device: O2 Device: Nasal Cannula O2 Flow Rate: O2 Flow Rate (L/min): 6 L/min  Intake/output summary:   Intake/Output Summary (Last 24 hours) at 03/06/2018 0858 Last data filed at 03/16/2018 04/24/2018 Gross per 24 hour  Intake 1277.33 ml  Output 500 ml  Net 777.33 ml   LBM: Last BM Date: (pt unsure) Baseline Weight: Weight: 40.8 kg (90 lb) Most recent weight: Weight: 40.8 kg (90 lb)       Palliative Assessment/Data: 10%  Patient Active Problem List   Diagnosis Date Noted  . Palliative care encounter   . Encounter for hospice care discussion   . Acute on chronic respiratory failure (HCC) 03-01-2018  . Acute diastolic CHF (congestive heart failure) (HCC) 02/11/2018  . Acute bronchitis 03/19/2016  . Klebsiella pneumoniae infection 03/19/2016  . Proteus mirabilis infection 03/19/2016  . Acute renal failure (HCC) 03/19/2016  . Sinus tachycardia 03/19/2016  . Essential hypertension 03/19/2016  . Constipation 03/19/2016  . Severe aortic stenosis 03/19/2016  . LVH (left ventricular hypertrophy) 03/19/2016  . Moderate mitral regurgitation 03/19/2016  . Malnutrition of moderate degree 03/15/2016  . Acute on chronic respiratory failure with hypoxia (HCC) 03/12/2016  . COPD exacerbation (HCC) 07/10/2015  . Hemoptysis 07/10/2015  . Chronic anemia 07/10/2015  . Generalized weakness 07/10/2015  . HTN (hypertension) 07/10/2015  . Aortic stenosis 07/10/2015  . Depression 07/10/2015  . Hypothyroidism 07/10/2015  . Pressure ulcer stage III 07/10/2015  . Pressure ulcer 04/28/2015  . Acetabular fracture (HCC) 04/27/2015    Palliative Care Plan    Recommendations/Plan:  Will bladder scan/place foley to attempt to eliminate fluid if possible.  If successful will order additional lasix.  Robinul TID and PRN for secretions  Morphine infusion with PRN boluses  Ativan scheduled and PRN  Goals of  Care and Additional Recommendations:  Limitations on Scope of Treatment: Full Comfort Care  Code Status:  DNR  Prognosis:   Hours - Days   Discharge Planning:  Anticipated Hospital Death  Care plan was discussed with bedside RN and attending physician.  Thank you for allowing the Palliative Medicine Team to assist in the care of this patient.  Total time spent:  35 min.     Greater than 50%  of this time was spent counseling and coordinating care related to the above assessment and plan.  Norvel Richards, PA-C Palliative Medicine  Please contact Palliative MedicineTeam phone at [phone] for questions and concerns between 7 am - 7 pm.   Please see AMION for individual provider pager numbers.

## 2018-03-25 NOTE — Care Management (Addendum)
Late entry:  RNCM consult for home with hospice.  Daughter and son in law at bedside.  PCP [name].  Patient ahas hospital bed, BSC, WC,RW, cane, and O2 in the home.  Agency preference provided and [facility] selected.  Clydie Braun with hospice notified.  Anticipated discharge tomorrow with home hospice services.  Patient will need EMS for transport.

## 2018-03-25 NOTE — Progress Notes (Signed)
Family Meeting Note  Advance Directive:yes  Today a meeting took place with the Patient and Daughter at bedside.  The following clinical team members were present during this meeting:MD  The following were discussed:Patient's diagnosis:   84 y.o. female  with past medical history of critical aortic stenosis, diastolic heart failure, atrial septal defect, dementia and stroke who was admitted on 01/31/2018 with respiratory distress secondary to exertion. Seem to have clinically worsen while here and actively dying.  Patient's progosis: < 2 weeks and Goals for treatment: DNR  Additional follow-up to be provided: Hospice Home  Time spent during discussion:20 minutes  Delfino Lovett, MD

## 2018-03-25 NOTE — Progress Notes (Signed)
Follow up visit made to new referral for [facility] services at home. Patient with significant decline overnight.  All family at bedside. Patient seen lying in bed audible secretions notes, patient cyanotic with increased work of breathing. Palliative NP Norvel Richards in prior to writer's visit new orders placed. Staff RN Sherian Maroon in to start continuous morphine drip. Patient repositioned with Abbie. Emotional support and end of life education given to family. Hospital death expected. Referral updated. Dayna Barker RN, BSN, [facility] [place], hospital liaison [phone]

## 2018-03-25 DEATH — deceased
